# Patient Record
Sex: Female | Born: 1955 | Race: Black or African American | Hispanic: No | Marital: Single | Smoking: Current every day smoker
Health system: Southern US, Community
[De-identification: ages and names within clinical notes are randomized; demographics above are authoritative.]

## PROBLEM LIST (undated history)

## (undated) DIAGNOSIS — C801 Malignant (primary) neoplasm, unspecified: Secondary | ICD-10-CM

## (undated) DIAGNOSIS — Z923 Personal history of irradiation: Secondary | ICD-10-CM

## (undated) DIAGNOSIS — R011 Cardiac murmur, unspecified: Secondary | ICD-10-CM

## (undated) DIAGNOSIS — I10 Essential (primary) hypertension: Secondary | ICD-10-CM

## (undated) DIAGNOSIS — I509 Heart failure, unspecified: Secondary | ICD-10-CM

## (undated) HISTORY — PX: ABDOMINAL HYSTERECTOMY: SHX81

## (undated) HISTORY — PX: CARDIAC CATHETERIZATION: SHX172

---

## 2014-10-14 ENCOUNTER — Ambulatory Visit: Payer: Self-pay | Admitting: Podiatry

## 2014-10-17 ENCOUNTER — Ambulatory Visit (INDEPENDENT_AMBULATORY_CARE_PROVIDER_SITE_OTHER): Payer: No Typology Code available for payment source | Admitting: Podiatry

## 2014-10-17 ENCOUNTER — Encounter: Payer: Self-pay | Admitting: Podiatry

## 2014-10-17 VITALS — BP 177/101 | HR 79 | Ht 65.0 in | Wt 132.0 lb

## 2014-10-17 DIAGNOSIS — B079 Viral wart, unspecified: Secondary | ICD-10-CM

## 2014-10-17 DIAGNOSIS — B078 Other viral warts: Secondary | ICD-10-CM | POA: Insufficient documentation

## 2014-10-17 DIAGNOSIS — Q828 Other specified congenital malformations of skin: Secondary | ICD-10-CM | POA: Insufficient documentation

## 2014-10-17 DIAGNOSIS — M79606 Pain in leg, unspecified: Secondary | ICD-10-CM

## 2014-10-17 NOTE — Patient Instructions (Signed)
Seen for painful lesion under right and left foot. Both debrided. Return in one week after applying acid patch for a week to have it removed.

## 2014-10-17 NOTE — Progress Notes (Signed)
Subjective:  58 year old female points callused area under 5th metatarsal on left, under 5th metatarsal base right foot been hurt for 5 months. She tried everything and nothing has helped (soaking, [employer] cream, medicated pad etc.).  Objective: Painful circular porokeratotic lesion, 0.8cm under the base of 5th Metatarsal right foot and under the 4th MPJ area ball of left foot.  Neurovascular status are within normal. Mild bunion bilateral.  Assessment: R/O Verruca plantaris. Porokeratosis bilateral painful.  Plan: Reviewed findings.  Both feet debrided. May benefit from acid treatment and removal of the lesion. Patient will return after next week holiday for this procedure.

## 2014-10-31 ENCOUNTER — Ambulatory Visit: Payer: Self-pay | Admitting: Podiatry

## 2020-04-17 ENCOUNTER — Ambulatory Visit: Payer: Self-pay | Admitting: Medical

## 2020-04-17 DIAGNOSIS — Z0289 Encounter for other administrative examinations: Secondary | ICD-10-CM

## 2020-08-30 DIAGNOSIS — I509 Heart failure, unspecified: Secondary | ICD-10-CM | POA: Insufficient documentation

## 2021-03-08 DIAGNOSIS — I701 Atherosclerosis of renal artery: Secondary | ICD-10-CM | POA: Insufficient documentation

## 2021-03-08 DIAGNOSIS — I2510 Atherosclerotic heart disease of native coronary artery without angina pectoris: Secondary | ICD-10-CM | POA: Insufficient documentation

## 2021-03-08 DIAGNOSIS — Z720 Tobacco use: Secondary | ICD-10-CM | POA: Insufficient documentation

## 2021-08-04 ENCOUNTER — Other Ambulatory Visit: Payer: Self-pay | Admitting: Internal Medicine

## 2021-08-04 DIAGNOSIS — N6315 Unspecified lump in the right breast, overlapping quadrants: Secondary | ICD-10-CM

## 2021-09-08 ENCOUNTER — Other Ambulatory Visit: Payer: Self-pay

## 2021-09-08 ENCOUNTER — Other Ambulatory Visit: Payer: Self-pay | Admitting: Internal Medicine

## 2021-09-08 ENCOUNTER — Ambulatory Visit
Admission: RE | Admit: 2021-09-08 | Discharge: 2021-09-08 | Disposition: A | Discharge status: Home / Self Care | Payer: Self-pay | Source: Ambulatory Visit | Attending: Internal Medicine | Admitting: Internal Medicine

## 2021-09-08 ENCOUNTER — Ambulatory Visit
Admission: RE | Admit: 2021-09-08 | Discharge: 2021-09-08 | Disposition: A | Discharge status: Home / Self Care | Payer: Medicare Other | Source: Ambulatory Visit | Attending: Internal Medicine | Admitting: Internal Medicine

## 2021-09-08 DIAGNOSIS — N6315 Unspecified lump in the right breast, overlapping quadrants: Secondary | ICD-10-CM

## 2021-09-10 ENCOUNTER — Other Ambulatory Visit: Payer: Self-pay

## 2021-09-10 ENCOUNTER — Ambulatory Visit
Admission: RE | Admit: 2021-09-10 | Discharge: 2021-09-10 | Disposition: A | Discharge status: Home / Self Care | Payer: Medicare Other | Source: Ambulatory Visit | Attending: Internal Medicine | Admitting: Internal Medicine

## 2021-09-10 ENCOUNTER — Other Ambulatory Visit: Payer: Self-pay | Admitting: Internal Medicine

## 2021-09-10 DIAGNOSIS — N6315 Unspecified lump in the right breast, overlapping quadrants: Secondary | ICD-10-CM

## 2021-09-14 ENCOUNTER — Other Ambulatory Visit: Payer: Self-pay | Admitting: Surgery

## 2021-09-14 DIAGNOSIS — I10 Essential (primary) hypertension: Secondary | ICD-10-CM | POA: Insufficient documentation

## 2021-09-15 NOTE — Progress Notes (Addendum)
[facility] CONSULT NOTE  Patient Care Team: Pcp, No as PCP - General  CHIEF COMPLAINTS/PURPOSE OF CONSULTATION:  Newly diagnosed breast cancer  HISTORY OF PRESENTING ILLNESS:  Lynn Mcgee 65 y.o. female is here because of recent diagnosis of invasive mammary carcinoma of the right breast. She presented with right breast thickening and swelling. Diagnostic mammogram and [place] on 09/08/2021 showed highly suspicious mass involving the majority of the right breast measuring at least 11 cm, and diffuse right breast skin thickening and 3 abnormal right axillary lymph nodes likely representing malignant involvement. Biopsy on 09/10/2021 showed invasive mammary carcinoma with pleomorphic features and right axillary lymph node positive for metastatic carcinoma, ER-/PR-(<1%)/Her2+ (3+). She presents to the clinic today for initial evaluation and discussion of treatment options.  She felt that the hardening of her breast started about 6 weeks ago.  I reviewed her records extensively and collaborated the history with the patient.  SUMMARY OF ONCOLOGIC HISTORY: Oncology History  Malignant neoplasm of upper-outer quadrant of right breast in female, estrogen receptor negative ([place])  09/10/2021 Initial Diagnosis   Right breast thickening and swelling: Mammogram revealed extremely hard right breast with peau d'orange skin changes, ultrasound revealed very large irregular mass measuring 11 cm with diffuse skin thickening, 3 abnormal right axillary lymph nodes, biopsy revealed grade 3 invasive mammary cancer with pleomorphic features, lymph node positive, ER 0%, PR 0%, HER2 3+, Ki-67 30%   09/16/2021 Cancer Staging   Staging form: Breast, AJCC 8th Edition - Clinical stage from 09/16/2021: Stage IIIB (cT4d, cN1, cM0, G3, ER-, PR-, HER2+) - Signed by Nicholas Lose, MD on 09/16/2021 Stage prefix: Initial diagnosis Histologic grading system: 3 grade system     MEDICAL HISTORY:  History of  congestive heart failure  SURGICAL HISTORY: No prior breast surgeries  SOCIAL HISTORY: Denies any tobacco or alcohol or recreational drug use  FAMILY HISTORY: No family history of breast cancer    ALLERGIES:  has No Known Allergies.  MEDICATIONS:  No current outpatient medications on file.   No current facility-administered medications for this visit.    REVIEW OF SYSTEMS:   Constitutional: Denies fevers, chills or abnormal night sweats Eyes: Denies blurriness of vision, double vision or watery eyes Ears, nose, mouth, throat, and face: Denies mucositis or sore throat Respiratory: Denies cough, dyspnea or wheezes Cardiovascular: Denies palpitation, chest discomfort or lower extremity swelling Gastrointestinal:  Denies nausea, heartburn or change in bowel habits Skin: Denies abnormal skin rashes Lymphatics: Denies new lymphadenopathy or easy bruising Neurological:Denies numbness, tingling or new weaknesses Behavioral/Psych: Mood is stable, no new changes  Breast: Large mass in the right breast with lymphedema changes All other systems were reviewed with the patient and are negative.  PHYSICAL EXAMINATION: ECOG PERFORMANCE STATUS: 1 - Symptomatic but completely ambulatory  Vitals:   09/16/21 1308  BP: (!) 198/88  Pulse: 94  Resp: 18  Temp: 98.1 F (36.7 C)  SpO2: 98%   Filed Weights   09/16/21 1308  Weight: 126 lb 1.6 oz (57.2 kg)      RADIOGRAPHIC STUDIES: I have personally reviewed the radiological reports and agreed with the findings in the report.  ASSESSMENT AND PLAN:  Malignant neoplasm of upper-outer quadrant of right breast in female, estrogen receptor negative ([place]) 09/10/2021 right breast thickening and swelling: Mammogram revealed extremely hard right breast with peau d'orange skin changes, ultrasound revealed very large irregular mass measuring 11 cm with diffuse skin thickening, 3 abnormal right axillary lymph nodes, biopsy revealed grade 3  invasive  mammary cancer with pleomorphic features, lymph node positive, ER 0%, PR 0%, HER2 3+, Ki-67 30%  Inflammatory breast cancer stage IIIb  Pathology and radiology counseling: Discussed with the patient, the details of pathology including the type of breast cancer,the clinical staging, the significance of ER, PR and HER-2/neu receptors and the implications for treatment. After reviewing the pathology in detail, we proceeded to discuss the different treatment options between surgery, radiation, chemotherapy, antiestrogen therapies.  Recommendation based on multidisciplinary tumor board: 1. Neoadjuvant chemotherapy with TCHP 2. Followed by breast conserving surgery with sentinel lymph node study vs targeted axillary dissection 3. Followed by adjuvant radiation therapy  Chemotherapy Counseling: I discussed the risks and benefits of chemotherapy including the risks of nausea/ vomiting, risk of infection from low WBC count, fatigue due to chemo or anemia, bruising or bleeding due to low platelets, mouth sores, loss/ change in taste and decreased appetite. Liver and kidney function will be monitored through out chemotherapy as abnormalities in liver and kidney function may be a side effect of treatment.  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  Plan: 1. Port placement  2. Echocardiogram 3. Chemotherapy class 4. Breast MRI 5. CT chest abdomen pelvis and bone scan for staging     Return to clinic in 1 week to start chemotherapy.   All questions were answered. The patient knows to call the clinic with any problems, questions or concerns.   Rulon Eisenmenger, MD, MPH 09/16/2021    I, Thana Ates, am acting as scribe for Nicholas Lose, MD.  I have reviewed the above documentation for accuracy and completeness, and I agree with the above.

## 2021-09-16 ENCOUNTER — Other Ambulatory Visit: Payer: Self-pay

## 2021-09-16 ENCOUNTER — Inpatient Hospital Stay: Payer: Medicare Other | Attending: Hematology and Oncology | Admitting: Hematology and Oncology

## 2021-09-16 ENCOUNTER — Other Ambulatory Visit: Payer: Self-pay | Admitting: *Deleted

## 2021-09-16 ENCOUNTER — Encounter: Payer: Self-pay | Admitting: *Deleted

## 2021-09-16 ENCOUNTER — Encounter: Payer: Self-pay | Admitting: General Practice

## 2021-09-16 ENCOUNTER — Other Ambulatory Visit: Payer: Self-pay | Admitting: Radiology

## 2021-09-16 ENCOUNTER — Telehealth: Payer: Self-pay | Admitting: *Deleted

## 2021-09-16 VITALS — BP 198/88 | HR 94 | Temp 98.1°F | Resp 18 | Wt 126.1 lb

## 2021-09-16 DIAGNOSIS — Z171 Estrogen receptor negative status [ER-]: Secondary | ICD-10-CM | POA: Diagnosis not present

## 2021-09-16 DIAGNOSIS — Z5111 Encounter for antineoplastic chemotherapy: Secondary | ICD-10-CM | POA: Diagnosis not present

## 2021-09-16 DIAGNOSIS — Z5189 Encounter for other specified aftercare: Secondary | ICD-10-CM | POA: Diagnosis not present

## 2021-09-16 DIAGNOSIS — C50411 Malignant neoplasm of upper-outer quadrant of right female breast: Secondary | ICD-10-CM

## 2021-09-16 MED ORDER — PROCHLORPERAZINE MALEATE 10 MG PO TABS: 10.0000 mg | ORAL_TABLET | Freq: Four times a day (QID) | ORAL | 1 refills | Status: DC | PRN

## 2021-09-16 MED ORDER — DEXAMETHASONE 4 MG PO TABS: 4.0000 mg | ORAL_TABLET | Freq: Every day | ORAL | 0 refills | Status: DC

## 2021-09-16 MED ORDER — LIDOCAINE-PRILOCAINE 2.5-2.5 % EX CREA: TOPICAL_CREAM | CUTANEOUS | 3 refills | Status: DC

## 2021-09-16 MED ORDER — ONDANSETRON HCL 8 MG PO TABS: 8.0000 mg | ORAL_TABLET | Freq: Two times a day (BID) | ORAL | 1 refills | Status: DC | PRN

## 2021-09-16 MED ORDER — LORAZEPAM 0.5 MG PO TABS: 0.5000 mg | ORAL_TABLET | Freq: Every evening | ORAL | 0 refills | Status: DC | PRN

## 2021-09-16 NOTE — Progress Notes (Signed)
START ON PATHWAY REGIMEN - Breast     Cycle 1: A cycle is 21 days:     Pertuzumab      Trastuzumab-xxxx      Docetaxel      Carboplatin    Cycles 2 through 6: A cycle is every 21 days:     Pertuzumab      Trastuzumab-xxxx      Docetaxel      Carboplatin   **Always confirm dose/schedule in your pharmacy ordering system**  Patient Characteristics: Preoperative or Nonsurgical Candidate (Clinical Staging), Neoadjuvant Therapy followed by Surgery, Invasive Disease, Chemotherapy, HER2 Positive, ER Negative/Unknown Therapeutic Status: Preoperative or Nonsurgical Candidate (Clinical Staging) AJCC M Category: cM0 AJCC Grade: G3 Breast Surgical Plan: Neoadjuvant Therapy followed by Surgery ER Status: Negative (-) AJCC 8 Stage Grouping: IIIB HER2 Status: Positive (+) AJCC T Category: cT4b AJCC N Category: cN1 PR Status: Negative (-) Intent of Therapy: Curative Intent, Discussed with Patient

## 2021-09-16 NOTE — Assessment & Plan Note (Signed)
09/10/2021 right breast thickening and swelling: Mammogram revealed extremely hard right breast with peau d'orange skin changes, ultrasound revealed very large irregular mass measuring 11 cm with diffuse skin thickening, 3 abnormal right axillary lymph nodes, biopsy revealed grade 3 invasive mammary cancer with pleomorphic features, lymph node positive, ER 0%, PR 0%, HER2 3+, Ki-67 30%  Inflammatory breast cancer stage IIIb  Pathology and radiology counseling: Discussed with the patient, the details of pathology including the type of breast cancer,the clinical staging, the significance of ER, PR and HER-2/neu receptors and the implications for treatment. After reviewing the pathology in detail, we proceeded to discuss the different treatment options between surgery, radiation, chemotherapy, antiestrogen therapies.  Recommendation based on multidisciplinary tumor board: 1. Neoadjuvant chemotherapy with Adriamycin and Cytoxan dose dense 4 followed by Taxol weekly 12 with carboplatin every 3 weeks x4 2. Followed by breast conserving surgery with sentinel lymph node study vs targeted axillary dissection 3. Followed by adjuvant radiation therapy  Chemotherapy Counseling: I discussed the risks and benefits of chemotherapy including the risks of nausea/ vomiting, risk of infection from low WBC count, fatigue due to chemo or anemia, bruising or bleeding due to low platelets, mouth sores, loss/ change in taste and decreased appetite. Liver and kidney function will be monitored through out chemotherapy as abnormalities in liver and kidney function may be a side effect of treatment.  Peripheral neuropathy due to Taxol and cardiac dysfunction due to Adriamycin was discussed in detail. Risk of permanent bone marrow dysfunction due to chemo were also discussed.  Plan: 1. Port placement  2. Echocardiogram 3. Chemotherapy class 4. Breast MRI 5. CT chest abdomen pelvis and bone scan for staging Genetic  counseling will also be arranged  URCC 16070: Treatment of refractory nausea.  After first cycle of chemo if patient experience chemo induced nausea and vomiting the randomized from cycle 2 to Aloxi plus Dex plus olanzapine or placebo plus Compazine or placebo plus placebo prior to chemo and take home medications for day 2 today for of Dex plus olanzapine or placebo and Compazine or placebo every 8 hours.  If patient does not have nausea after cycle 1, then the trial is complete.  Return to clinic in 1 week to start chemotherapy.

## 2021-09-16 NOTE — Progress Notes (Signed)
[facility] CSW Progress Notes  Met w patient, husband and daughter (here from [place]) in exam room at request of RN.  Patient wants information on community resources for financial assistance.  Briefly discussed options for Medicare, advised them to contact [employer] for [employer] - she may be able to obtain better coverage for her medical needs during open enrollment period.  [place] will contact her re Advertising account executive after she has a treatment plan in place.  Briefly described [facility] in [place] and [place] - she is somewhat overwhelmed with information at this time and will contact CSW when she is ready to hear more.  She is currently working and is concerned about loss of income and increase in expenses as result of this diagnosis.  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  Encouraged daughter to reach out to CSW as needed if mother is agreeable.  Will mail application copies to home address. Provided information on Support Center during visit.   Edwyna Shell, LCSW Clinical Social Worker Phone:  [phone]

## 2021-09-16 NOTE — Telephone Encounter (Signed)
Called and spoke with patient to follow up from new patient appointment and assess navigation needs. Spoke with patient's daughter [name] as well.  Informed them that I was able to get her port placed by IR for 10/21. Patient is to arrive at 10am, npo after MN and needs a driver. Patient and daughter verbalized understanding. Informed them that I am working on everything else and will let them know. Contact information given.

## 2021-09-17 ENCOUNTER — Other Ambulatory Visit: Payer: Self-pay

## 2021-09-17 ENCOUNTER — Telehealth: Payer: Self-pay | Admitting: Hematology and Oncology

## 2021-09-17 ENCOUNTER — Ambulatory Visit (HOSPITAL_COMMUNITY)
Admission: RE | Admit: 2021-09-17 | Discharge: 2021-09-17 | Disposition: A | Discharge status: Home / Self Care | Payer: 59 | Source: Ambulatory Visit | Attending: Hematology and Oncology | Admitting: Hematology and Oncology

## 2021-09-17 ENCOUNTER — Other Ambulatory Visit: Payer: Self-pay | Admitting: Hematology and Oncology

## 2021-09-17 DIAGNOSIS — C50411 Malignant neoplasm of upper-outer quadrant of right female breast: Secondary | ICD-10-CM

## 2021-09-17 DIAGNOSIS — Z171 Estrogen receptor negative status [ER-]: Secondary | ICD-10-CM | POA: Insufficient documentation

## 2021-09-17 DIAGNOSIS — F17210 Nicotine dependence, cigarettes, uncomplicated: Secondary | ICD-10-CM | POA: Insufficient documentation

## 2021-09-17 HISTORY — PX: IR IMAGING GUIDED PORT INSERTION: IMG5740

## 2021-09-17 MED ORDER — SODIUM CHLORIDE 0.9 % IV SOLN: INTRAVENOUS | Status: DC

## 2021-09-17 MED ORDER — MIDAZOLAM HCL 2 MG/2ML IJ SOLN
INTRAMUSCULAR | Status: AC
  Filled 2021-09-17: qty 2

## 2021-09-17 MED ORDER — FENTANYL CITRATE (PF) 100 MCG/2ML IJ SOLN
INTRAMUSCULAR | Status: AC | PRN
  Administered 2021-09-17: 25 ug via INTRAVENOUS
  Administered 2021-09-17: 50 ug via INTRAVENOUS

## 2021-09-17 MED ORDER — FENTANYL CITRATE (PF) 100 MCG/2ML IJ SOLN
INTRAMUSCULAR | Status: AC
  Filled 2021-09-17: qty 2

## 2021-09-17 MED ORDER — HEPARIN SOD (PORK) LOCK FLUSH 100 UNIT/ML IV SOLN
INTRAVENOUS | Status: AC
  Filled 2021-09-17: qty 5

## 2021-09-17 MED ORDER — LIDOCAINE-EPINEPHRINE 1 %-1:100000 IJ SOLN
INTRAMUSCULAR | Status: AC
  Filled 2021-09-17: qty 1

## 2021-09-17 MED ORDER — SODIUM CHLORIDE 0.9 % IV SOLN
INTRAVENOUS | Status: AC | PRN
  Administered 2021-09-17: 10 mL/h via INTRAVENOUS

## 2021-09-17 MED ORDER — MIDAZOLAM HCL 2 MG/2ML IJ SOLN
INTRAMUSCULAR | Status: AC | PRN
  Administered 2021-09-17: 1 mg via INTRAVENOUS
  Administered 2021-09-17: .5 mg via INTRAVENOUS

## 2021-09-17 MED ORDER — LIDOCAINE HCL (PF) 1 % IJ SOLN
INTRAMUSCULAR | Status: AC | PRN
  Administered 2021-09-17: 10 mL via SUBCUTANEOUS

## 2021-09-17 NOTE — H&P (Signed)
Chief Complaint: Chemotherapy access. Request is for portacath placement. Left sided.   Referring Physician(s): NOBSJG,GEZMO  Supervising Physician: Ruthann Cancer  Patient Status: [facility] - Out-pt  History of Present Illness: Lynn Mcgee is a 65 y.o. female 65 y.o. female outpatient. 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  Plan is for neoadjuvant chemotherapy followed by breast surgery and lymph node dissection and then radiaton. but Patient presents for portacath placement for chemotherapy access. Left sided.  Currently without any significant complaints. Patient alert and laying in bed, calm and comfortable. Denies any fevers, headache, chest pain, SOB, cough, abdominal pain, nausea, vomiting or bleeding. Return precautions, treatment recommendations and follow-up discussed with the patient who is agreeable with the plan.    No past medical history on file.  No past surgical history on file.  Allergies: Patient has no known allergies.  Medications: Prior to Admission medications   Medication Sig Start Date End Date Taking? Authorizing Provider  dexamethasone (DECADRON) 4 MG tablet Take 1 tablet (4 mg total) by mouth daily. Take 1 tab day before chemo and 1 tab day after chemo with food 09/16/21   Nicholas Lose, MD  lidocaine-prilocaine (EMLA) cream Apply to affected area once 09/16/21   Nicholas Lose, MD  LORazepam (ATIVAN) 0.5 MG tablet Take 1 tablet (0.5 mg total) by mouth at bedtime as needed for sleep. 09/16/21   Nicholas Lose, MD  ondansetron (ZOFRAN) 8 MG tablet Take 1 tablet (8 mg total) by mouth 2 (two) times daily as needed (Nausea or vomiting). Start on the third day after chemotherapy. 09/16/21   Nicholas Lose, MD  prochlorperazine (COMPAZINE) 10 MG tablet Take 1 tablet (10 mg total) by mouth every 6 (six) hours as needed (Nausea or vomiting). 09/16/21   Nicholas Lose, MD     No  family history on file.  Social History   Socioeconomic History   Marital status: Single    Spouse name: Not on file   Number of children: Not on file   Years of education: Not on file   Highest education level: Not on file  Occupational History   Not on file  Tobacco Use   Smoking status: Every Day    Types: Cigarettes   Smokeless tobacco: Never  Substance and Sexual Activity   Alcohol use: Not on file   Drug use: Not on file   Sexual activity: Not on file  Other Topics Concern   Not on file  Social History Narrative   Not on file   Social Determinants of Health   Financial Resource Strain: Not on file  Food Insecurity: Not on file  Transportation Needs: Not on file  Physical Activity: Not on file  Stress: Not on file  Social Connections: Not on file     Review of Systems: A 12 point ROS discussed and pertinent positives are indicated in the HPI above.  All other systems are negative.  Review of Systems  Constitutional:  Negative for fatigue and fever.  HENT:  Negative for congestion.   Respiratory:  Negative for cough and shortness of breath.   Gastrointestinal:  Negative for abdominal pain, diarrhea, nausea and vomiting.   Vital Signs: BP (!) 164/87   Pulse 86   Temp 97.9 F (36.6 C) (Oral)   Resp 15   Ht 5' 5"  (1.651 m)   Wt 127 lb (57.6 kg)   LMP  (LMP Unknown)  SpO2 100%   BMI 21.13 kg/m   Physical Exam Vitals and nursing note reviewed.  Constitutional:      Appearance: She is well-developed.  HENT:     Head: Normocephalic and atraumatic.  Eyes:     Conjunctiva/sclera: Conjunctivae normal.  Cardiovascular:     Rate and Rhythm: Normal rate and regular rhythm.     Heart sounds: Normal heart sounds.  Pulmonary:     Effort: Pulmonary effort is normal.     Breath sounds: Normal breath sounds.  Musculoskeletal:        General: Normal range of motion.     Cervical back: Normal range of motion.  Skin:    General: Skin is warm.  Neurological:      Mental Status: She is alert and oriented to person, place, and time.    Imaging: US BREAST LTD UNI RIGHT INC AXILLA  Addendum Date: 09/09/2021   ADDENDUM REPORT: 09/09/2021 09:48 ADDENDUM: Comparison should read NONE Electronically Signed   By: Margarette Canada M.D.   On: 09/09/2021 09:48   Result Date: 09/09/2021 CLINICAL DATA:  65 year old female with RIGHT breast thickening and swelling. Also for bilateral mammogram. EXAM: DIGITAL DIAGNOSTIC BILATERAL MAMMOGRAM WITH TOMOSYNTHESIS AND CAD; ULTRASOUND RIGHT BREAST LIMITED TECHNIQUE: Bilateral digital diagnostic mammography and breast tomosynthesis was performed. The images were evaluated with computer-aided detection.; Targeted ultrasound examination of the right breast was performed COMPARISON:  Previous exam(s). ACR Breast Density Category c: The breast tissue is heterogeneously dense, which may obscure small masses. FINDINGS: 2D/3D full field views of both breasts demonstrate a large ill-defined mass encompassing the majority of the RIGHT breast. Diffuse RIGHT breast skin thickening is also noted. No suspicious LEFT breast abnormalities are noted. On physical exam, the majority of the RIGHT breast is extremely hard. Peau d'orange RIGHT breast skin changes are noted. Targeted ultrasound is performed, showing a very large irregular mixed echogenicity but primarily hypoechoic mass throughout most of the central, UPPER and OUTER RIGHT breast with greatest diameter measuring at least 11 cm. Diffuse RIGHT breast skin thickening is present. Three abnormal RIGHT axillary lymph nodes with cortical thickening are noted. IMPRESSION: 1. Highly suspicious mass involving the majority of the RIGHT breast measuring at least 11 cm. Diffuse RIGHT breast skin thickening and 3 abnormal RIGHT axillary lymph nodes likely representing malignant involvement. Tissue sampling of the RIGHT breast mass and 1 of the abnormal RIGHT axillary lymph nodes is recommended. 2. No  mammographic evidence of LEFT breast malignancy. RECOMMENDATION: Ultrasound-guided RIGHT breast biopsy and RIGHT axillary lymph node biopsy. I have discussed the findings and recommendations with the patient. If applicable, a reminder letter will be sent to the patient regarding the next appointment. BI-RADS CATEGORY  5: Highly suggestive of malignancy. Electronically Signed: By: Margarette Canada M.D. On: 09/08/2021 14:40  MM DIAG BREAST TOMO BILATERAL  Addendum Date: 09/09/2021   ADDENDUM REPORT: 09/09/2021 09:48 ADDENDUM: Comparison should read NONE Electronically Signed   By: Margarette Canada M.D.   On: 09/09/2021 09:48   Result Date: 09/09/2021 CLINICAL DATA:  65 year old female with RIGHT breast thickening and swelling. Also for bilateral mammogram. EXAM: DIGITAL DIAGNOSTIC BILATERAL MAMMOGRAM WITH TOMOSYNTHESIS AND CAD; ULTRASOUND RIGHT BREAST LIMITED TECHNIQUE: Bilateral digital diagnostic mammography and breast tomosynthesis was performed. The images were evaluated with computer-aided detection.; Targeted ultrasound examination of the right breast was performed COMPARISON:  Previous exam(s). ACR Breast Density Category c: The breast tissue is heterogeneously dense, which may obscure small masses. FINDINGS: 2D/3D full field views of  both breasts demonstrate a large ill-defined mass encompassing the majority of the RIGHT breast. Diffuse RIGHT breast skin thickening is also noted. No suspicious LEFT breast abnormalities are noted. On physical exam, the majority of the RIGHT breast is extremely hard. Peau d'orange RIGHT breast skin changes are noted. Targeted ultrasound is performed, showing a very large irregular mixed echogenicity but primarily hypoechoic mass throughout most of the central, UPPER and OUTER RIGHT breast with greatest diameter measuring at least 11 cm. Diffuse RIGHT breast skin thickening is present. Three abnormal RIGHT axillary lymph nodes with cortical thickening are noted. IMPRESSION: 1.  Highly suspicious mass involving the majority of the RIGHT breast measuring at least 11 cm. Diffuse RIGHT breast skin thickening and 3 abnormal RIGHT axillary lymph nodes likely representing malignant involvement. Tissue sampling of the RIGHT breast mass and 1 of the abnormal RIGHT axillary lymph nodes is recommended. 2. No mammographic evidence of LEFT breast malignancy. RECOMMENDATION: Ultrasound-guided RIGHT breast biopsy and RIGHT axillary lymph node biopsy. I have discussed the findings and recommendations with the patient. If applicable, a reminder letter will be sent to the patient regarding the next appointment. BI-RADS CATEGORY  5: Highly suggestive of malignancy. Electronically Signed: By: Margarette Canada M.D. On: 09/08/2021 14:40   [place] AXILLARY NODE CORE BIOPSY RIGHT  Result Date: 09/10/2021 CLINICAL DATA:  Patient with suspicious right breast mass and thickened right axillary lymph nodes. EXAM: ULTRASOUND GUIDED RIGHT BREAST CORE NEEDLE BIOPSY COMPARISON:  Previous exam(s). PROCEDURE: I met with the patient and we discussed the procedure of ultrasound-guided biopsy, including benefits and alternatives. We discussed the high likelihood of a successful procedure. We discussed the risks of the procedure, including infection, bleeding, tissue injury, clip migration, and inadequate sampling. Informed written consent was given. The usual time-out protocol was performed immediately prior to the procedure. Site 1: Right breast mass 12 o'clock position Lesion quadrant: Upper outer quadrant Using sterile technique and 1% Lidocaine as local anesthetic, under direct ultrasound visualization, a 14 gauge spring-loaded device was used to perform biopsy of right breast mass using a lateral approach. At the conclusion of the procedure ribbon shaped tissue marker clip was deployed into the biopsy cavity. Follow up 2 view mammogram was performed and dictated separately. Site 2: Right axillary lymph node Lesion quadrant:  Upper outer quadrant Using sterile technique and 1% Lidocaine as local anesthetic, under direct ultrasound visualization, a 14 gauge spring-loaded device was used to perform biopsy of right axillary lymph node using a lateral approach. At the conclusion of the procedure tri bell tissue marker clip was deployed into the biopsy cavity. Follow up 2 view mammogram was performed and dictated separately. IMPRESSION: Ultrasound guided biopsy of right breast mass and right axillary lymph node. No apparent complications. Electronically Signed   By: Lovey Newcomer M.D.   On: 09/10/2021 09:17  MM CLIP PLACEMENT RIGHT  Result Date: 09/10/2021 CLINICAL DATA:  Patient status post ultrasound-guided biopsy right breast mass and right axillary lymph node. EXAM: 3D DIAGNOSTIC RIGHT MAMMOGRAM POST ULTRASOUND BIOPSY COMPARISON:  Previous exam(s). FINDINGS: 3D Mammographic images were obtained following ultrasound guided biopsy of right breast mass and right axillary lymph node. Site 1: Right breast mass 12 o'clock position: Ribbon shaped clip: In appropriate position. Site 2: Right axillary lymph node: Marker clip not included on current exam. IMPRESSION: Appropriate positioning of the ribbon shaped marking clip in the right breast mass. The tri bell clip is not able to be visualized within the node. Final Assessment: Post Procedure Mammograms  for Marker Placement Electronically Signed   By: Lovey Newcomer M.D.   On: 09/10/2021 09:19  [place] RT BREAST BX W LOC DEV 1ST LESION IMG BX SPEC US GUIDE  Result Date: 09/10/2021 CLINICAL DATA:  Patient with suspicious right breast mass and thickened right axillary lymph nodes. EXAM: ULTRASOUND GUIDED RIGHT BREAST CORE NEEDLE BIOPSY COMPARISON:  Previous exam(s). PROCEDURE: I met with the patient and we discussed the procedure of ultrasound-guided biopsy, including benefits and alternatives. We discussed the high likelihood of a successful procedure. We discussed the risks of the procedure,  including infection, bleeding, tissue injury, clip migration, and inadequate sampling. Informed written consent was given. The usual time-out protocol was performed immediately prior to the procedure. Site 1: Right breast mass 12 o'clock position Lesion quadrant: Upper outer quadrant Using sterile technique and 1% Lidocaine as local anesthetic, under direct ultrasound visualization, a 14 gauge spring-loaded device was used to perform biopsy of right breast mass using a lateral approach. At the conclusion of the procedure ribbon shaped tissue marker clip was deployed into the biopsy cavity. Follow up 2 view mammogram was performed and dictated separately. Site 2: Right axillary lymph node Lesion quadrant: Upper outer quadrant Using sterile technique and 1% Lidocaine as local anesthetic, under direct ultrasound visualization, a 14 gauge spring-loaded device was used to perform biopsy of right axillary lymph node using a lateral approach. At the conclusion of the procedure tri bell tissue marker clip was deployed into the biopsy cavity. Follow up 2 view mammogram was performed and dictated separately. IMPRESSION: Ultrasound guided biopsy of right breast mass and right axillary lymph node. No apparent complications. Electronically Signed   By: Lovey Newcomer M.D.   On: 09/10/2021 09:17   Labs:  CBC: No results for input(s): WBC, HGB, HCT, PLT in the last 8760 hours.  COAGS: No results for input(s): INR, APTT in the last 8760 hours.  BMP: No results for input(s): NA, K, CL, CO2, GLUCOSE, BUN, CALCIUM, CREATININE, GFRNONAA, GFRAA in the last 8760 hours.  Invalid input(s): CMP  LIVER FUNCTION TESTS: No results for input(s): BILITOT, AST, ALT, ALKPHOS, PROT, ALBUMIN in the last 8760 hours.  TUMOR MARKERS: No results for input(s): AFPTM, CEA, CA199, CHROMGRNA in the last 8760 hours.  Assessment and Plan:  65 y.o. female outpatient. Per note from oncology, history of CHF and newly diagnosed right upper  outer breast cancer with mets to the right axillary.  Plan is for neoadjuvant chemotherapy followed by breast surgery and lymph node dissection and then radiaton. Patient presents for portacath placement for chemotherapy access. Left sided.   No recent labs. All medications are within acceptable parameters. NKDA. Patient has been NPO since midnight.  Risks and benefits of image guided port-a-catheter placement was discussed with the patient including, but not limited to bleeding, infection, pneumothorax, or fibrin sheath development and need for additional procedures.  All of the patient's questions were answered, patient is agreeable to proceed. Consent signed and in chart.   Thank you for this interesting consult.  I greatly enjoyed meeting Taelyr Jantz and look forward to participating in their care.  A copy of this report was sent to the requesting provider on this date.  Electronically Signed: Jacqualine Mau, NP 09/17/2021, 10:04 AM   I spent a total of  30 Minutes   in face to face in clinical consultation, greater than 50% of which was counseling/coordinating care for portacath placement

## 2021-09-17 NOTE — Telephone Encounter (Signed)
Spoke to patients daughter [name] to give updated appointment days and times, sent [name] a calendar with the appointments and locations as well as prep for the CT scan, advised her to reach out to myself or her navigators if she had an additional questions or concerns.

## 2021-09-17 NOTE — Progress Notes (Signed)
Pharmacist Chemotherapy Monitoring - Initial Assessment    Anticipated start date: 09/24/21   The following has been reviewed per standard work regarding the patient's treatment regimen: The patient's diagnosis, treatment plan and drug doses, and organ/hematologic function Lab orders and baseline tests specific to treatment regimen  The treatment plan start date, drug sequencing, and pre-medications Prior authorization status  Patient's documented medication list, including drug-drug interaction screen and prescriptions for anti-emetics and supportive care specific to the treatment regimen The drug concentrations, fluid compatibility, administration routes, and timing of the medications to be used The patient's access for treatment and lifetime cumulative dose history, if applicable  The patient's medication allergies and previous infusion related reactions, if applicable   Changes made to treatment plan:  N/A  Follow up needed:  Pending authorization for treatment    Larene Beach, [place], 09/17/2021  12:48 PM

## 2021-09-17 NOTE — Progress Notes (Signed)
The following biosimilar Kanjinti (trastuzumab-anns) has been selected for use in this patient per insurance.  Klara Stjames, PharmD 

## 2021-09-17 NOTE — Procedures (Signed)
Interventional Radiology Procedure Note  Procedure: Single Lumen Power Port Placement    Access:  Left internal jugular vein  Findings: Catheter tip positioned at cavoatrial junction. Port is ready for immediate use.   Complications: None  EBL: < 10 mL  Recommendations:  - Ok to shower in 24 hours - Do not submerge for 7 days - Routine line care    Joh Rao, MD   

## 2021-09-20 ENCOUNTER — Encounter: Payer: Self-pay | Admitting: *Deleted

## 2021-09-20 ENCOUNTER — Telehealth: Payer: Self-pay | Admitting: *Deleted

## 2021-09-20 NOTE — Telephone Encounter (Signed)
Spoke with patient's daughter [name] regarding patient's MRI appt that has not been scheduled. She states she was going to call and she will do so right away.  Informed her that her mom's chemo may have to be rescheduled due to insurance approval.  She states she will call insurance company as well to see if there is anything she can do to expedite. I will keep her updated.

## 2021-09-21 ENCOUNTER — Ambulatory Visit
Admission: RE | Admit: 2021-09-21 | Discharge: 2021-09-21 | Disposition: A | Discharge status: Home / Self Care | Payer: Medicare Other | Source: Ambulatory Visit | Attending: Hematology and Oncology | Admitting: Hematology and Oncology

## 2021-09-21 ENCOUNTER — Other Ambulatory Visit: Payer: Self-pay

## 2021-09-21 ENCOUNTER — Telehealth: Payer: Self-pay | Admitting: Emergency Medicine

## 2021-09-21 DIAGNOSIS — C50411 Malignant neoplasm of upper-outer quadrant of right female breast: Secondary | ICD-10-CM

## 2021-09-21 MED ORDER — GADOBUTROL 1 MMOL/ML IV SOLN
6.0000 mL | Freq: Once | INTRAVENOUS | Status: AC | PRN
  Administered 2021-09-21: 6 mL via INTRAVENOUS

## 2021-09-21 NOTE — Telephone Encounter (Signed)
[id number] - TREATMENT OF REFRACTORY NAUSEA  Attempted to contact pt as a potential study candidate for [place].  No answer, unable to leave VM as box was full.  Will attempt to contact tomorrow.  Wells Guiles 'Learta CoddingNeysa Bonito, RN, BSN Clinical Research Nurse I 09/21/21 3:52 PM

## 2021-09-22 ENCOUNTER — Telehealth: Payer: Self-pay | Admitting: Emergency Medicine

## 2021-09-22 ENCOUNTER — Ambulatory Visit (HOSPITAL_COMMUNITY)
Admission: RE | Admit: 2021-09-22 | Discharge: 2021-09-22 | Disposition: A | Discharge status: Home / Self Care | Payer: Medicare Other | Source: Ambulatory Visit | Attending: Hematology and Oncology | Admitting: Hematology and Oncology

## 2021-09-22 ENCOUNTER — Encounter (HOSPITAL_COMMUNITY): Payer: 59

## 2021-09-22 DIAGNOSIS — I2510 Atherosclerotic heart disease of native coronary artery without angina pectoris: Secondary | ICD-10-CM | POA: Diagnosis not present

## 2021-09-22 DIAGNOSIS — Z0189 Encounter for other specified special examinations: Secondary | ICD-10-CM | POA: Diagnosis not present

## 2021-09-22 DIAGNOSIS — I700 Atherosclerosis of aorta: Secondary | ICD-10-CM | POA: Diagnosis not present

## 2021-09-22 DIAGNOSIS — I77819 Aortic ectasia, unspecified site: Secondary | ICD-10-CM | POA: Diagnosis not present

## 2021-09-22 DIAGNOSIS — I351 Nonrheumatic aortic (valve) insufficiency: Secondary | ICD-10-CM | POA: Diagnosis not present

## 2021-09-22 DIAGNOSIS — J439 Emphysema, unspecified: Secondary | ICD-10-CM | POA: Insufficient documentation

## 2021-09-22 DIAGNOSIS — R590 Localized enlarged lymph nodes: Secondary | ICD-10-CM | POA: Insufficient documentation

## 2021-09-22 DIAGNOSIS — R234 Changes in skin texture: Secondary | ICD-10-CM | POA: Diagnosis not present

## 2021-09-22 DIAGNOSIS — I517 Cardiomegaly: Secondary | ICD-10-CM | POA: Diagnosis not present

## 2021-09-22 DIAGNOSIS — C50411 Malignant neoplasm of upper-outer quadrant of right female breast: Secondary | ICD-10-CM | POA: Diagnosis present

## 2021-09-22 DIAGNOSIS — Z171 Estrogen receptor negative status [ER-]: Secondary | ICD-10-CM | POA: Insufficient documentation

## 2021-09-22 LAB — ECHOCARDIOGRAM COMPLETE
AR max vel: 1.56 cm2
AV Peak grad: 11.9 mmHg
Ao pk vel: 1.73 m/s
Area-P 1/2: 2.84 cm2
Calc EF: 42.4 %
P 1/2 time: 621 msec
S' Lateral: 3.8 cm
Single Plane A2C EF: 46.9 %
Single Plane A4C EF: 42.5 %

## 2021-09-22 NOTE — Progress Notes (Signed)
Patient Care Team: Pcp, No as PCP - General Nicholas Lose, MD as Consulting Physician (Hematology and Oncology) Rockwell Germany, RN as Oncology Nurse Navigator Mauro Kaufmann, RN as Oncology Nurse Navigator  DIAGNOSIS:    ICD-10-CM   1. Malignant neoplasm of upper-outer quadrant of right breast in female, estrogen receptor negative ([place])  C50.411    Z17.1       SUMMARY OF ONCOLOGIC HISTORY: Oncology History  Malignant neoplasm of upper-outer quadrant of right breast in female, estrogen receptor negative ([place])  09/10/2021 Initial Diagnosis   Right breast thickening and swelling: Mammogram revealed extremely hard right breast with peau d'orange skin changes, ultrasound revealed very large irregular mass measuring 11 cm with diffuse skin thickening, 3 abnormal right axillary lymph nodes, biopsy revealed grade 3 invasive mammary cancer with pleomorphic features, lymph node positive, ER 0%, PR 0%, HER2 3+, Ki-67 30%   09/16/2021 Cancer Staging   Staging form: Breast, AJCC 8th Edition - Clinical stage from 09/16/2021: Stage IIIB (cT4d, cN1, cM0, G3, ER-, PR-, HER2+) - Signed by Nicholas Lose, MD on 09/16/2021 Stage prefix: Initial diagnosis Histologic grading system: 3 grade system    09/24/2021 -  Chemotherapy   Patient is on Treatment Plan : BREAST  Docetaxel + Carboplatin + Trastuzumab + Pertuzumab  (TCHP) q21d        CHIEF COMPLIANT: Follow-up of right breast cancer  INTERVAL HISTORY: Lynn Mcgee is a 65 y.o. with above-mentioned history of right breast cancer. MRI Breast on 09/22/2021 showed diffuse mass and non-mass enhancement throughout the right breast, involving all 4 quadrants, extending from anterior to posterior depth, overall measuring at least 10 cm craniocaudal dimension and 8 cm transverse dimension, and at least 5 enlarged/morphologically abnormal level 1 and level 2 lymph nodes in the right axilla. She presents to the clinic today for follow-up.   ALLERGIES:   has No Known Allergies.  MEDICATIONS:  Current Outpatient Medications  Medication Sig Dispense Refill   dexamethasone (DECADRON) 4 MG tablet Take 1 tablet (4 mg total) by mouth daily. Take 1 tab day before chemo and 1 tab day after chemo with food 12 tablet 0   lidocaine-prilocaine (EMLA) cream Apply to affected area once 30 g 3   LORazepam (ATIVAN) 0.5 MG tablet Take 1 tablet (0.5 mg total) by mouth at bedtime as needed for sleep. 30 tablet 0   ondansetron (ZOFRAN) 8 MG tablet Take 1 tablet (8 mg total) by mouth 2 (two) times daily as needed (Nausea or vomiting). Start on the third day after chemotherapy. 30 tablet 1   prochlorperazine (COMPAZINE) 10 MG tablet Take 1 tablet (10 mg total) by mouth every 6 (six) hours as needed (Nausea or vomiting). 30 tablet 1   No current facility-administered medications for this visit.    PHYSICAL EXAMINATION: ECOG PERFORMANCE STATUS: 1 - Symptomatic but completely ambulatory  Vitals:   09/23/21 0942  BP: (!) 187/78  Pulse: 65  Resp: 18  Temp: (!) 97.3 F (36.3 C)  SpO2: 98%   Filed Weights   09/23/21 0942  Weight: 126 lb 4.8 oz (57.3 kg)     LABORATORY DATA:  I have reviewed the data as listed CMP Latest Ref Rng & Units 09/23/2021  Creatinine 0.44 - 1.00 mg/dL 0.60    No results found for: WBC, HGB, HCT, MCV, PLT, NEUTROABS  ASSESSMENT & PLAN:  Malignant neoplasm of upper-outer quadrant of right breast in female, estrogen receptor negative ([place]) 09/10/2021 right breast thickening and swelling: Mammogram  revealed extremely hard right breast with peau d'orange skin changes, ultrasound revealed very large irregular mass measuring 11 cm with diffuse skin thickening, 3 abnormal right axillary lymph nodes, biopsy revealed grade 3 invasive mammary cancer with pleomorphic features, lymph node positive, ER 0%, PR 0%, HER2 3+, Ki-67 30% Stage IIIb  Treatment plan: 1. Neoadjuvant chemotherapy with TCHP 2. Followed by breast conserving surgery  with sentinel lymph node study vs targeted axillary dissection 3. 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  We will proceed with the treatment because of benefits of anti-HER2 therapy or tremendous)  Labs reviewed, chemo education completed, chemo consent obtained, antiemetics were reviewed. Return to clinic in 1 week for toxicity evaluation    No orders of the defined types were placed in this encounter.  The patient has a good understanding of the overall plan. she agrees with it. she will call with any problems that may develop before the next visit here.  Total time spent: 30 mins including face to face time and time spent for planning, charting and coordination of care  Rulon Eisenmenger, MD, MPH 09/23/2021  I, Thana Ates, am acting as scribe for Dr. Nicholas Lose.  I have reviewed the above documentation for accuracy and completeness, and I agree with the above.

## 2021-09-22 NOTE — Telephone Encounter (Signed)
[id number] - TREATMENT OF REFRACTORY NAUSEA  Attempted to contact pt regarding study, no answer.  VM full, unable to leave message.  Wells Guiles 'Learta CoddingNeysa Bonito, RN, BSN Clinical Research Nurse I 09/22/21 10:31 AM

## 2021-09-23 ENCOUNTER — Other Ambulatory Visit: Payer: Self-pay | Admitting: *Deleted

## 2021-09-23 ENCOUNTER — Inpatient Hospital Stay: Payer: Medicare Other

## 2021-09-23 ENCOUNTER — Ambulatory Visit: Payer: 59 | Admitting: Hematology and Oncology

## 2021-09-23 ENCOUNTER — Other Ambulatory Visit: Payer: Self-pay

## 2021-09-23 ENCOUNTER — Inpatient Hospital Stay (HOSPITAL_BASED_OUTPATIENT_CLINIC_OR_DEPARTMENT_OTHER): Payer: Medicare Other | Admitting: Hematology and Oncology

## 2021-09-23 ENCOUNTER — Ambulatory Visit (HOSPITAL_COMMUNITY)
Admission: RE | Admit: 2021-09-23 | Discharge: 2021-09-23 | Disposition: A | Discharge status: Home / Self Care | Payer: Medicare Other | Source: Ambulatory Visit | Attending: Hematology and Oncology | Admitting: Hematology and Oncology

## 2021-09-23 ENCOUNTER — Other Ambulatory Visit: Payer: 59

## 2021-09-23 DIAGNOSIS — Z171 Estrogen receptor negative status [ER-]: Secondary | ICD-10-CM

## 2021-09-23 DIAGNOSIS — C50411 Malignant neoplasm of upper-outer quadrant of right female breast: Secondary | ICD-10-CM | POA: Diagnosis not present

## 2021-09-23 DIAGNOSIS — Z5111 Encounter for antineoplastic chemotherapy: Secondary | ICD-10-CM | POA: Diagnosis not present

## 2021-09-23 LAB — CBC WITH DIFFERENTIAL (CANCER CENTER ONLY)
Abs Immature Granulocytes: 0.01 10*3/uL (ref 0.00–0.07)
Basophils Absolute: 0 10*3/uL (ref 0.0–0.1)
Basophils Relative: 0 %
Eosinophils Absolute: 0.4 10*3/uL (ref 0.0–0.5)
Eosinophils Relative: 6 %
HCT: 43.7 % (ref 36.0–46.0)
Hemoglobin: 14.1 g/dL (ref 12.0–15.0)
Immature Granulocytes: 0 %
Lymphocytes Relative: 51 %
Lymphs Abs: 3.5 10*3/uL (ref 0.7–4.0)
MCH: 24.2 pg — ABNORMAL LOW (ref 26.0–34.0)
MCHC: 32.3 g/dL (ref 30.0–36.0)
MCV: 75 fL — ABNORMAL LOW (ref 80.0–100.0)
Monocytes Absolute: 0.4 10*3/uL (ref 0.1–1.0)
Monocytes Relative: 6 %
Neutro Abs: 2.6 10*3/uL (ref 1.7–7.7)
Neutrophils Relative %: 37 %
Platelet Count: 245 10*3/uL (ref 150–400)
RBC: 5.83 MIL/uL — ABNORMAL HIGH (ref 3.87–5.11)
RDW: 14.9 % (ref 11.5–15.5)
WBC Count: 7 10*3/uL (ref 4.0–10.5)
nRBC: 0 % (ref 0.0–0.2)

## 2021-09-23 LAB — CMP (CANCER CENTER ONLY)
ALT: 9 U/L (ref 0–44)
AST: 16 U/L (ref 15–41)
Albumin: 4 g/dL (ref 3.5–5.0)
Alkaline Phosphatase: 93 U/L (ref 38–126)
Anion gap: 12 (ref 5–15)
BUN: 10 mg/dL (ref 8–23)
CO2: 21 mmol/L — ABNORMAL LOW (ref 22–32)
Calcium: 9.5 mg/dL (ref 8.9–10.3)
Chloride: 108 mmol/L (ref 98–111)
Creatinine: 0.69 mg/dL (ref 0.44–1.00)
GFR, Estimated: >60 mL/min (ref >60)
Glucose, Bld: 86 mg/dL (ref 70–99)
Potassium: 3.8 mmol/L (ref 3.5–5.1)
Sodium: 141 mmol/L (ref 135–145)
Total Bilirubin: 0.7 mg/dL (ref 0.3–1.2)
Total Protein: 7.9 g/dL (ref 6.5–8.1)

## 2021-09-23 LAB — POCT I-STAT CREATININE: Creatinine, Ser: 0.6 mg/dL (ref 0.44–1.00)

## 2021-09-23 MED ORDER — IOHEXOL 350 MG/ML SOLN
80.0000 mL | Freq: Once | INTRAVENOUS | Status: AC | PRN
  Administered 2021-09-23: 80 mL via INTRAVENOUS

## 2021-09-23 MED FILL — Fosaprepitant Dimeglumine For IV Infusion 150 MG (Base Eq): INTRAVENOUS | Qty: 5 | Status: AC

## 2021-09-23 MED FILL — Dexamethasone Sodium Phosphate Inj 100 MG/10ML: INTRAMUSCULAR | Qty: 1 | Status: AC

## 2021-09-23 NOTE — Progress Notes (Signed)
Per MD request referral placed for Dr. Haroldine Laws with cardiology.  Orders placed and in-basket message sent to provider and nurse.

## 2021-09-23 NOTE — Assessment & Plan Note (Signed)
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 Neoadjuvant chemotherapy with TCHP 2. Followed by breast conserving surgery with sentinel lymph node study vs targeted axillary dissection 3. 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  We will proceed with the treatment because of benefits of anti-HER2 therapy or tremendous)  Labs reviewed, chemo education completed, chemo consent obtained, antiemetics were reviewed. Return to clinic in 1 week for toxicity evaluation

## 2021-09-24 ENCOUNTER — Other Ambulatory Visit (HOSPITAL_COMMUNITY): Payer: 59

## 2021-09-24 ENCOUNTER — Encounter: Payer: Self-pay | Admitting: *Deleted

## 2021-09-24 ENCOUNTER — Inpatient Hospital Stay: Payer: Medicare Other

## 2021-09-24 ENCOUNTER — Other Ambulatory Visit: Payer: Self-pay | Admitting: Hematology and Oncology

## 2021-09-24 VITALS — BP 162/80 | HR 70 | Temp 98.7°F | Resp 16 | Wt 125.5 lb

## 2021-09-24 DIAGNOSIS — Z171 Estrogen receptor negative status [ER-]: Secondary | ICD-10-CM

## 2021-09-24 DIAGNOSIS — Z5111 Encounter for antineoplastic chemotherapy: Secondary | ICD-10-CM | POA: Diagnosis not present

## 2021-09-24 DIAGNOSIS — C50411 Malignant neoplasm of upper-outer quadrant of right female breast: Secondary | ICD-10-CM

## 2021-09-24 DIAGNOSIS — I10 Essential (primary) hypertension: Secondary | ICD-10-CM

## 2021-09-24 MED ORDER — SODIUM CHLORIDE 0.9 % IV SOLN: Freq: Once | INTRAVENOUS | Status: AC

## 2021-09-24 MED ORDER — ACETAMINOPHEN 325 MG PO TABS
650.0000 mg | ORAL_TABLET | Freq: Once | ORAL | Status: AC
  Administered 2021-09-24: 650 mg via ORAL
  Filled 2021-09-24: qty 2

## 2021-09-24 MED ORDER — PALONOSETRON HCL INJECTION 0.25 MG/5ML
0.2500 mg | Freq: Once | INTRAVENOUS | Status: AC
  Administered 2021-09-24: 0.25 mg via INTRAVENOUS
  Filled 2021-09-24: qty 5

## 2021-09-24 MED ORDER — SODIUM CHLORIDE 0.9 % IV SOLN
75.0000 mg/m2 | Freq: Once | INTRAVENOUS | Status: AC
  Administered 2021-09-24: 120 mg via INTRAVENOUS
  Filled 2021-09-24: qty 12

## 2021-09-24 MED ORDER — SODIUM CHLORIDE 0.9 % IV SOLN
450.0000 mg | Freq: Once | INTRAVENOUS | Status: AC
  Administered 2021-09-24: 450 mg via INTRAVENOUS
  Filled 2021-09-24: qty 45

## 2021-09-24 MED ORDER — SODIUM CHLORIDE 0.9% FLUSH
10.0000 mL | INTRAVENOUS | Status: DC | PRN
Start: 2021-09-24 — End: 2021-09-24
  Administered 2021-09-24: 10 mL

## 2021-09-24 MED ORDER — TRASTUZUMAB-ANNS CHEMO 150 MG IV SOLR: 8.0000 mg/kg | Freq: Once | INTRAVENOUS | Status: DC

## 2021-09-24 MED ORDER — DIPHENHYDRAMINE HCL 25 MG PO CAPS
50.0000 mg | ORAL_CAPSULE | Freq: Once | ORAL | Status: AC
  Administered 2021-09-24: 50 mg via ORAL
  Filled 2021-09-24: qty 2

## 2021-09-24 MED ORDER — SODIUM CHLORIDE 0.9 % IV SOLN
150.0000 mg | Freq: Once | INTRAVENOUS | Status: AC
  Administered 2021-09-24: 150 mg via INTRAVENOUS
  Filled 2021-09-24: qty 150

## 2021-09-24 MED ORDER — CLONIDINE HCL 0.1 MG PO TABS
0.1000 mg | ORAL_TABLET | Freq: Once | ORAL | Status: AC
  Administered 2021-09-24: 0.1 mg via ORAL
  Filled 2021-09-24: qty 1

## 2021-09-24 MED ORDER — HEPARIN SOD (PORK) LOCK FLUSH 100 UNIT/ML IV SOLN
500.0000 [IU] | Freq: Once | INTRAVENOUS | Status: AC | PRN
  Administered 2021-09-24: 500 [IU]

## 2021-09-24 MED ORDER — SODIUM CHLORIDE 0.9 % IV SOLN: 420.0000 mg | Freq: Once | INTRAVENOUS | Status: DC

## 2021-09-24 MED ORDER — SODIUM CHLORIDE 0.9 % IV SOLN
10.0000 mg | Freq: Once | INTRAVENOUS | Status: AC
  Administered 2021-09-24: 10 mg via INTRAVENOUS
  Filled 2021-09-24: qty 10

## 2021-09-24 NOTE — Patient Instructions (Signed)
[facility]   Discharge Instructions: Thank you for choosing [facility] to provide your oncology and hematology care.   If you have a lab appointment with the [facility], please go directly to the [facility] and check in at the registration area.   Wear comfortable clothing and clothing appropriate for easy access to any Portacath or PICC line.   We strive to give you quality time with your provider. You may need to reschedule your appointment if you arrive late (15 or more minutes).  Arriving late affects you and other patients whose appointments are after yours.  Also, if you miss three or more appointments without notifying the office, you may be dismissed from the clinic at the provider's discretion.      For prescription refill requests, have your pharmacy contact our office and allow 72 hours for refills to be completed.    Today you received the following chemotherapy and/or immunotherapy agents: docetaxel and carboplatin.   To help prevent nausea and vomiting after your treatment, we encourage you to take your nausea medication as directed.  BELOW ARE SYMPTOMS THAT SHOULD BE REPORTED IMMEDIATELY: *FEVER GREATER THAN 100.4 F (38 C) OR HIGHER *CHILLS OR SWEATING *NAUSEA AND VOMITING THAT IS NOT CONTROLLED WITH YOUR NAUSEA MEDICATION *UNUSUAL SHORTNESS OF BREATH *UNUSUAL BRUISING OR BLEEDING *URINARY PROBLEMS (pain or burning when urinating, or frequent urination) *BOWEL PROBLEMS (unusual diarrhea, constipation, pain near the anus) TENDERNESS IN MOUTH AND THROAT WITH OR WITHOUT PRESENCE OF ULCERS (sore throat, sores in mouth, or a toothache) UNUSUAL RASH, SWELLING OR PAIN  UNUSUAL VAGINAL DISCHARGE OR ITCHING   Items with * indicate a potential emergency and should be followed up as soon as possible or go to the Emergency Department if any problems should occur.  Please show the CHEMOTHERAPY ALERT CARD or IMMUNOTHERAPY ALERT CARD  at check-in to the Emergency Department and triage nurse.  Should you have questions after your visit or need to cancel or reschedule your appointment, please contact [facility]  Dept: [phone]  and follow the prompts.  Office hours are 8:00 a.m. to 4:30 p.m. Monday - Friday. Please note that voicemails left after 4:00 p.m. may not be returned until the following business day.  We are closed weekends and major holidays. You have access to a nurse at all times for urgent questions. Please call the main number to the clinic Dept: [phone] and follow the prompts.   For any non-urgent questions, you may also contact your provider using MyChart. We now offer e-Visits for anyone 65 and older to request care online for non-urgent symptoms. For details visit [email or web address].   Also download the MyChart app! Go to the app store, search "MyChart", open the app, select [facility], and log in with your MyChart username and password.  Due to Covid, a mask is required upon entering the hospital/clinic. If you do not have a mask, one will be given to you upon arrival. For doctor visits, patients may have 1 support person aged 65 or older with them. For treatment visits, patients cannot have anyone with them due to current Covid guidelines and our immunocompromised population.   Docetaxel injection What is this medication? DOCETAXEL (doe se TAX el) is a chemotherapy drug. It targets fast dividing cells, like cancer cells, and causes these cells to die. This medicine is used to treat many types of cancers like breast cancer, certain stomach cancers, head and neck cancer,  lung cancer, and prostate cancer. This medicine may be used for other purposes; ask your health care provider or pharmacist if you have questions. COMMON BRAND NAME(S): Docefrez, Taxotere What should I tell my care team before I take this medication? They need to know if you have any of these  conditions: infection (especially a virus infection such as chickenpox, cold sores, or herpes) liver disease low blood counts, like low white cell, platelet, or red cell counts an unusual or allergic reaction to docetaxel, polysorbate 80, other chemotherapy agents, other medicines, foods, dyes, or preservatives pregnant or trying to get pregnant breast-feeding How should I use this medication? This drug is given as an infusion into a vein. It is administered in a hospital or clinic by a specially trained health care professional. Talk to your pediatrician regarding the use of this medicine in children. Special care may be needed. Overdosage: If you think you have taken too much of this medicine contact a poison control center or emergency room at once. NOTE: This medicine is only for you. Do not share this medicine with others. What if I miss a dose? It is important not to miss your dose. Call your doctor or health care professional if you are unable to keep an appointment. What may interact with this medication? Do not take this medicine with any of the following medications: live virus vaccines This medicine may also interact with the following medications: aprepitant certain antibiotics like erythromycin or clarithromycin certain antivirals for HIV or hepatitis certain medicines for fungal infections like fluconazole, itraconazole, ketoconazole, posaconazole, or voriconazole cimetidine ciprofloxacin conivaptan cyclosporine dronedarone fluvoxamine grapefruit juice imatinib verapamil This list may not describe all possible interactions. Give your health care provider a list of all the medicines, herbs, non-prescription drugs, or dietary supplements you use. Also tell them if you smoke, drink alcohol, or use illegal drugs. Some items may interact with your medicine. What should I watch for while using this medication? Your condition will be monitored carefully while you are receiving  this medicine. You will need important blood work done while you are taking this medicine. Call your doctor or health care professional for advice if you get a fever, chills or sore throat, or other symptoms of a cold or flu. Do not treat yourself. This drug decreases your body's ability to fight infections. Try to avoid being around people who are sick. Some products may contain alcohol. Ask your health care professional if this medicine contains alcohol. Be sure to tell all health care professionals you are taking this medicine. Certain medicines, like metronidazole and disulfiram, can cause an unpleasant reaction when taken with alcohol. The reaction includes flushing, headache, nausea, vomiting, sweating, and increased thirst. The reaction can last from 30 minutes to several hours. You may get drowsy or dizzy. Do not drive, use machinery, or do anything that needs mental alertness until you know how this medicine affects you. Do not stand or sit up quickly, especially if you are an older patient. This reduces the risk of dizzy or fainting spells. Alcohol may interfere with the effect of this medicine. Talk to your health care professional about your risk of cancer. You may be more at risk for certain types of cancer if you take this medicine. Do not become pregnant while taking this medicine or for 6 months after stopping it. Women should inform their doctor if they wish to become pregnant or think they might be pregnant. There is a potential for serious side effects to  an unborn child. Talk to your health care professional or pharmacist for more information. Do not breast-feed an infant while taking this medicine or for 1 week after stopping it. Males who get this medicine must use a condom during sex with females who can get pregnant. If you get a woman pregnant, the baby could have birth defects. The baby could die before they are born. You will need to continue wearing a condom for 3 months after  stopping the medicine. Tell your health care provider right away if your partner becomes pregnant while you are taking this medicine. This may interfere with the ability to father a child. You should talk to your doctor or health care professional if you are concerned about your fertility. What side effects may I notice from receiving this medication? Side effects that you should report to your doctor or health care professional as soon as possible: allergic reactions like skin rash, itching or hives, swelling of the face, lips, or tongue blurred vision breathing problems changes in vision low blood counts - This drug may decrease the number of white blood cells, red blood cells and platelets. You may be at increased risk for infections and bleeding. nausea and vomiting pain, redness or irritation at site where injected pain, tingling, numbness in the hands or feet redness, blistering, peeling, or loosening of the skin, including inside the mouth signs of decreased platelets or bleeding - bruising, pinpoint red spots on the skin, black, tarry stools, nosebleeds signs of decreased red blood cells - unusually weak or tired, fainting spells, lightheadedness signs of infection - fever or chills, cough, sore throat, pain or difficulty passing urine swelling of the ankle, feet, hands Side effects that usually do not require medical attention (report to your doctor or health care professional if they continue or are bothersome): constipation diarrhea fingernail or toenail changes hair loss loss of appetite mouth sores muscle pain This list may not describe all possible side effects. Call your doctor for medical advice about side effects. You may report side effects to FDA at 1-800-FDA-1088. Where should I keep my medication? This drug is given in a hospital or clinic and will not be stored at home. NOTE: This sheet is a summary. It may not cover all possible information. If you have questions  about this medicine, talk to your doctor, pharmacist, or health care provider.  2022 Elsevier/Gold Standard (2019-10-14 19:50:31)  Carboplatin injection What is this medication? CARBOPLATIN (KAR boe pla tin) is a chemotherapy drug. It targets fast dividing cells, like cancer cells, and causes these cells to die. This medicine is used to treat ovarian cancer and many other cancers. This medicine may be used for other purposes; ask your health care provider or pharmacist if you have questions. COMMON BRAND NAME(S): Paraplatin What should I tell my care team before I take this medication? They need to know if you have any of these conditions: blood disorders hearing problems kidney disease recent or ongoing radiation therapy an unusual or allergic reaction to carboplatin, cisplatin, other chemotherapy, other medicines, foods, dyes, or preservatives pregnant or trying to get pregnant breast-feeding How should I use this medication? This drug is usually given as an infusion into a vein. It is administered in a hospital or clinic by a specially trained health care professional. Talk to your pediatrician regarding the use of this medicine in children. Special care may be needed. Overdosage: If you think you have taken too much of this medicine contact a poison control  center or emergency room at once. NOTE: This medicine is only for you. Do not share this medicine with others. What if I miss a dose? It is important not to miss a dose. Call your doctor or health care professional if you are unable to keep an appointment. What may interact with this medication? medicines for seizures medicines to increase blood counts like filgrastim, pegfilgrastim, sargramostim some antibiotics like amikacin, gentamicin, neomycin, streptomycin, tobramycin vaccines Talk to your doctor or health care professional before taking any of these medicines: acetaminophen aspirin ibuprofen ketoprofen naproxen This  list may not describe all possible interactions. Give your health care provider a list of all the medicines, herbs, non-prescription drugs, or dietary supplements you use. Also tell them if you smoke, drink alcohol, or use illegal drugs. Some items may interact with your medicine. What should I watch for while using this medication? Your condition will be monitored carefully while you are receiving this medicine. You will need important blood work done while you are taking this medicine. This drug may make you feel generally unwell. This is not uncommon, as chemotherapy can affect healthy cells as well as cancer cells. Report any side effects. Continue your course of treatment even though you feel ill unless your doctor tells you to stop. In some cases, you may be given additional medicines to help with side effects. Follow all directions for their use. Call your doctor or health care professional for advice if you get a fever, chills or sore throat, or other symptoms of a cold or flu. Do not treat yourself. This drug decreases your body's ability to fight infections. Try to avoid being around people who are sick. This medicine may increase your risk to bruise or bleed. Call your doctor or health care professional if you notice any unusual bleeding. Be careful brushing and flossing your teeth or using a toothpick because you may get an infection or bleed more easily. If you have any dental work done, tell your dentist you are receiving this medicine. Avoid taking products that contain aspirin, acetaminophen, ibuprofen, naproxen, or ketoprofen unless instructed by your doctor. These medicines may hide a fever. Do not become pregnant while taking this medicine. Women should inform their doctor if they wish to become pregnant or think they might be pregnant. There is a potential for serious side effects to an unborn child. Talk to your health care professional or pharmacist for more information. Do not  breast-feed an infant while taking this medicine. What side effects may I notice from receiving this medication? Side effects that you should report to your doctor or health care professional as soon as possible: allergic reactions like skin rash, itching or hives, swelling of the face, lips, or tongue signs of infection - fever or chills, cough, sore throat, pain or difficulty passing urine signs of decreased platelets or bleeding - bruising, pinpoint red spots on the skin, black, tarry stools, nosebleeds signs of decreased red blood cells - unusually weak or tired, fainting spells, lightheadedness breathing problems changes in hearing changes in vision chest pain high blood pressure low blood counts - This drug may decrease the number of white blood cells, red blood cells and platelets. You may be at increased risk for infections and bleeding. nausea and vomiting pain, swelling, redness or irritation at the injection site pain, tingling, numbness in the hands or feet problems with balance, talking, walking trouble passing urine or change in the amount of urine Side effects that usually do not require  medical attention (report to your doctor or health care professional if they continue or are bothersome): hair loss loss of appetite metallic taste in the mouth or changes in taste This list may not describe all possible side effects. Call your doctor for medical advice about side effects. You may report side effects to FDA at 1-800-FDA-1088. Where should I keep my medication? This drug is given in a hospital or clinic and will not be stored at home. NOTE: This sheet is a summary. It may not cover all possible information. If you have questions about this medicine, talk to your doctor, pharmacist, or health care provider.  2022 Elsevier/Gold Standard (2008-02-19 14:38:05)

## 2021-09-24 NOTE — Progress Notes (Signed)
PA still pending for [place]. Dr. Lindi Adie aware and ok to proceed w/ Taxotere & Carboplatin today without Kanjinti & Perjeta.  Kennith Center, Pharm.D., CPP 09/24/2021@9 :33 AM

## 2021-09-27 ENCOUNTER — Inpatient Hospital Stay: Payer: Medicare Other

## 2021-09-27 ENCOUNTER — Encounter: Payer: Self-pay | Admitting: Adult Health

## 2021-09-27 ENCOUNTER — Other Ambulatory Visit: Payer: 59

## 2021-09-27 ENCOUNTER — Ambulatory Visit (HOSPITAL_COMMUNITY): Payer: 59

## 2021-09-27 ENCOUNTER — Encounter: Payer: Self-pay | Admitting: *Deleted

## 2021-09-27 ENCOUNTER — Other Ambulatory Visit: Payer: Self-pay

## 2021-09-27 VITALS — HR 81 | Temp 98.9°F | Resp 16

## 2021-09-27 DIAGNOSIS — Z5111 Encounter for antineoplastic chemotherapy: Secondary | ICD-10-CM | POA: Diagnosis not present

## 2021-09-27 DIAGNOSIS — C50411 Malignant neoplasm of upper-outer quadrant of right female breast: Secondary | ICD-10-CM

## 2021-09-27 MED ORDER — PEGFILGRASTIM-CBQV 6 MG/0.6ML ~~LOC~~ SOSY
6.0000 mg | PREFILLED_SYRINGE | Freq: Once | SUBCUTANEOUS | Status: AC
  Administered 2021-09-27: 6 mg via SUBCUTANEOUS
  Filled 2021-09-27: qty 0.6

## 2021-09-27 NOTE — Progress Notes (Signed)
Patients B/P 189/93 informed MD asked if he wanted her to receive her udenyca injection

## 2021-09-27 NOTE — Progress Notes (Signed)
Met with patient at registration to introduce myself as Arboriculturist and to offer available resources.  Discussed one-time $1000 Radio broadcast assistant to assist with personal expenses while going through treatment. Advised what is needed to apply and she states she can bring at next visit on 11/3.  Gave her my card if interested in applying and for any additional financial questions or concerns.

## 2021-09-28 ENCOUNTER — Encounter (HOSPITAL_COMMUNITY)
Admission: RE | Admit: 2021-09-28 | Discharge: 2021-09-28 | Disposition: A | Discharge status: Home / Self Care | Payer: Medicare Other | Source: Ambulatory Visit | Attending: Hematology and Oncology | Admitting: Hematology and Oncology

## 2021-09-28 DIAGNOSIS — C50411 Malignant neoplasm of upper-outer quadrant of right female breast: Secondary | ICD-10-CM | POA: Diagnosis not present

## 2021-09-28 DIAGNOSIS — Z171 Estrogen receptor negative status [ER-]: Secondary | ICD-10-CM | POA: Diagnosis present

## 2021-09-28 MED ORDER — TECHNETIUM TC 99M MEDRONATE IV KIT
20.0000 | PACK | Freq: Once | INTRAVENOUS | Status: AC | PRN
  Administered 2021-09-28: 21.5 via INTRAVENOUS

## 2021-09-29 ENCOUNTER — Encounter: Payer: Self-pay | Admitting: *Deleted

## 2021-09-30 ENCOUNTER — Other Ambulatory Visit: Payer: Self-pay

## 2021-09-30 ENCOUNTER — Ambulatory Visit: Admit: 2021-09-30 | Payer: 59 | Admitting: Surgery

## 2021-09-30 ENCOUNTER — Inpatient Hospital Stay (HOSPITAL_BASED_OUTPATIENT_CLINIC_OR_DEPARTMENT_OTHER): Payer: 59 | Admitting: Adult Health

## 2021-09-30 ENCOUNTER — Inpatient Hospital Stay: Payer: 59 | Attending: Hematology and Oncology

## 2021-09-30 ENCOUNTER — Encounter: Payer: Self-pay | Admitting: Adult Health

## 2021-09-30 VITALS — BP 169/84 | HR 79 | Temp 98.1°F | Resp 18 | Ht 65.0 in | Wt 126.1 lb

## 2021-09-30 DIAGNOSIS — Z5189 Encounter for other specified aftercare: Secondary | ICD-10-CM | POA: Insufficient documentation

## 2021-09-30 DIAGNOSIS — Z452 Encounter for adjustment and management of vascular access device: Secondary | ICD-10-CM | POA: Insufficient documentation

## 2021-09-30 DIAGNOSIS — Z5112 Encounter for antineoplastic immunotherapy: Secondary | ICD-10-CM | POA: Insufficient documentation

## 2021-09-30 DIAGNOSIS — C50411 Malignant neoplasm of upper-outer quadrant of right female breast: Secondary | ICD-10-CM | POA: Diagnosis present

## 2021-09-30 DIAGNOSIS — Z171 Estrogen receptor negative status [ER-]: Secondary | ICD-10-CM

## 2021-09-30 DIAGNOSIS — Z5111 Encounter for antineoplastic chemotherapy: Secondary | ICD-10-CM | POA: Diagnosis present

## 2021-09-30 DIAGNOSIS — Z95828 Presence of other vascular implants and grafts: Secondary | ICD-10-CM | POA: Insufficient documentation

## 2021-09-30 LAB — CMP (CANCER CENTER ONLY)
ALT: 10 U/L (ref 0–44)
AST: 12 U/L — ABNORMAL LOW (ref 15–41)
Albumin: 3.4 g/dL — ABNORMAL LOW (ref 3.5–5.0)
Alkaline Phosphatase: 81 U/L (ref 38–126)
Anion gap: 9 (ref 5–15)
BUN: 6 mg/dL — ABNORMAL LOW (ref 8–23)
CO2: 26 mmol/L (ref 22–32)
Calcium: 8.5 mg/dL — ABNORMAL LOW (ref 8.9–10.3)
Chloride: 103 mmol/L (ref 98–111)
Creatinine: 0.58 mg/dL (ref 0.44–1.00)
GFR, Estimated: >60 mL/min (ref >60)
Glucose, Bld: 92 mg/dL (ref 70–99)
Potassium: 3.2 mmol/L — ABNORMAL LOW (ref 3.5–5.1)
Sodium: 138 mmol/L (ref 135–145)
Total Bilirubin: 0.7 mg/dL (ref 0.3–1.2)
Total Protein: 6.6 g/dL (ref 6.5–8.1)

## 2021-09-30 LAB — CBC WITH DIFFERENTIAL (CANCER CENTER ONLY)
Abs Immature Granulocytes: 0.34 10*3/uL — ABNORMAL HIGH (ref 0.00–0.07)
Basophils Absolute: 0.1 10*3/uL (ref 0.0–0.1)
Basophils Relative: 1 %
Eosinophils Absolute: 0.1 10*3/uL (ref 0.0–0.5)
Eosinophils Relative: 2 %
HCT: 36 % (ref 36.0–46.0)
Hemoglobin: 12 g/dL (ref 12.0–15.0)
Immature Granulocytes: 8 %
Lymphocytes Relative: 62 %
Lymphs Abs: 2.6 10*3/uL (ref 0.7–4.0)
MCH: 24.7 pg — ABNORMAL LOW (ref 26.0–34.0)
MCHC: 33.3 g/dL (ref 30.0–36.0)
MCV: 74.1 fL — ABNORMAL LOW (ref 80.0–100.0)
Monocytes Absolute: 0.2 10*3/uL (ref 0.1–1.0)
Monocytes Relative: 5 %
Neutro Abs: 0.9 10*3/uL — ABNORMAL LOW (ref 1.7–7.7)
Neutrophils Relative %: 22 %
Platelet Count: 192 10*3/uL (ref 150–400)
RBC: 4.86 MIL/uL (ref 3.87–5.11)
RDW: 13.8 % (ref 11.5–15.5)
Smear Review: NORMAL
WBC Count: 4.2 10*3/uL (ref 4.0–10.5)
nRBC: 0 % (ref 0.0–0.2)

## 2021-09-30 SURGERY — INSERTION, TUNNELED CENTRAL VENOUS DEVICE, WITH PORT
Anesthesia: General | Site: Breast

## 2021-09-30 MED ORDER — HEPARIN SOD (PORK) LOCK FLUSH 100 UNIT/ML IV SOLN
500.0000 [IU] | Freq: Once | INTRAVENOUS | Status: AC
  Administered 2021-09-30: 500 [IU]

## 2021-09-30 MED ORDER — SODIUM CHLORIDE 0.9% FLUSH
10.0000 mL | Freq: Once | INTRAVENOUS | Status: AC
  Administered 2021-09-30: 10 mL

## 2021-09-30 NOTE — Progress Notes (Signed)
[facility] Follow up:    Pcp, No No address on file   DIAGNOSIS: Cancer Staging Malignant neoplasm of upper-outer quadrant of right breast in female, estrogen receptor negative ([place]) Staging form: Breast, AJCC 8th Edition - Clinical stage from 09/16/2021: Stage IIIB (cT4d, cN1, cM0, G3, ER-, PR-, HER2+) - Signed by Nicholas Lose, MD on 09/16/2021 Stage prefix: Initial diagnosis Histologic grading system: 3 grade system   SUMMARY OF ONCOLOGIC HISTORY: Oncology History  Malignant neoplasm of upper-outer quadrant of right breast in female, estrogen receptor negative ([place])  09/10/2021 Initial Diagnosis   Right breast thickening and swelling: Mammogram revealed extremely hard right breast with peau d'orange skin changes, ultrasound revealed very large irregular mass measuring 11 cm with diffuse skin thickening, 3 abnormal right axillary lymph nodes, biopsy revealed grade 3 invasive mammary cancer with pleomorphic features, lymph node positive, ER 0%, PR 0%, HER2 3+, Ki-67 30%   09/16/2021 Cancer Staging   Staging form: Breast, AJCC 8th Edition - Clinical stage from 09/16/2021: Stage IIIB (cT4d, cN1, cM0, G3, ER-, PR-, HER2+) - Signed by Nicholas Lose, MD on 09/16/2021 Stage prefix: Initial diagnosis Histologic grading system: 3 grade system    09/24/2021 -  Chemotherapy   Patient is on Treatment Plan : BREAST  Docetaxel + Carboplatin + Trastuzumab + Pertuzumab  (TCHP) q21d        CURRENT THERAPY: TCHP cycle 1 day 8  INTERVAL HISTORY: Lynn Mcgee 65 y.o. female returns for evaluation after receiving her first cycle of chemotherapy with TCHP.  Since her last visit Lynn underwent CT chest abdomen and pelvis on September 23, 2021.  Lynn also underwent bone scan on September 28, 2021.  Both showed no sign of metastatic disease.  Lynn Mcgee only received the Taxotere and Cytoxan last week.  Lynn is due to receive the Herceptin and Perjeta tomorrow.  Per Dr. Mariann Laster with her heart  function mildly decreased, Lynn is to proceed with therapy as he believes the risks outweigh the benefits.  Jode says that this is her interpretation of their discussion about risks and benefits as well and Lynn is willing to proceed with treatment.  Lekesha feels fatigued.  Lynn has had some nausea which Lynn has been relieved by Compazine.  Lynn also notes some constipation.   Patient Active Problem List   Diagnosis Date Noted   Port-A-Cath in place 09/30/2021   Malignant neoplasm of upper-outer quadrant of right breast in female, estrogen receptor negative ([place]) 09/16/2021   Porokeratosis 10/17/2014   Verruca 10/17/2014   Pain in lower limb 10/17/2014    has No Known Allergies.  MEDICAL HISTORY: History reviewed. No pertinent past medical history.  SURGICAL HISTORY: Past Surgical History:  Procedure Laterality Date   IR IMAGING GUIDED PORT INSERTION  09/17/2021    SOCIAL HISTORY: Social History   Socioeconomic History   Marital status: Single    Spouse name: Not on file   Number of children: Not on file   Years of education: Not on file   Highest education level: Not on file  Occupational History   Not on file  Tobacco Use   Smoking status: Every Day    Types: Cigarettes   Smokeless tobacco: Never  Substance and Sexual Activity   Alcohol use: Not on file   Drug use: Not on file   Sexual activity: Not on file  Other Topics Concern   Not on file  Social History Narrative   Not on file   Social Determinants  of Health   Financial Resource Strain: Not on file  Food Insecurity: Not on file  Transportation Needs: Not on file  Physical Activity: Not on file  Stress: Not on file  Social Connections: Not on file  Intimate Partner Violence: Not on file    FAMILY HISTORY: History reviewed. No pertinent family history.  Review of Systems  Constitutional:  Positive for fatigue. Negative for appetite change, chills, fever and unexpected weight change.  HENT:    Negative for hearing loss, lump/mass and trouble swallowing.   Eyes:  Negative for eye problems and icterus.  Respiratory:  Negative for chest tightness, cough and shortness of breath.   Cardiovascular:  Negative for chest pain, leg swelling and palpitations.  Gastrointestinal:  Positive for constipation and nausea. Negative for abdominal distention, abdominal pain, diarrhea and vomiting.  Endocrine: Negative for hot flashes.  Genitourinary:  Negative for difficulty urinating.   Musculoskeletal:  Negative for arthralgias.  Skin:  Negative for itching and rash.  Neurological:  Negative for dizziness, extremity weakness, headaches and numbness.  Hematological:  Negative for adenopathy. Does not bruise/bleed easily.  Psychiatric/Behavioral:  Negative for depression. The patient is not nervous/anxious.      PHYSICAL EXAMINATION  ECOG PERFORMANCE STATUS: 1 - Symptomatic but completely ambulatory  Vitals:   09/30/21 1423  BP: (!) 169/84  Pulse: 79  Resp: 18  Temp: 98.1 F (36.7 C)  SpO2: 96%    Physical Exam Constitutional:      General: Lynn is not in acute distress.    Appearance: Normal appearance. Lynn is not toxic-appearing.  HENT:     Head: Normocephalic and atraumatic.  Eyes:     General: No scleral icterus. Cardiovascular:     Rate and Rhythm: Normal rate and regular rhythm.     Pulses: Normal pulses.     Heart sounds: Normal heart sounds.  Pulmonary:     Effort: Pulmonary effort is normal.     Breath sounds: Normal breath sounds.  Abdominal:     General: Abdomen is flat. Bowel sounds are normal. There is no distension.     Palpations: Abdomen is soft.     Tenderness: There is no abdominal tenderness.  Musculoskeletal:        General: No swelling.     Cervical back: Neck supple.  Lymphadenopathy:     Cervical: No cervical adenopathy.  Skin:    General: Skin is warm and dry.     Findings: No rash.  Neurological:     General: No focal deficit present.     Mental  Status: Lynn is alert.  Psychiatric:        Mood and Affect: Mood normal.        Behavior: Behavior normal.    LABORATORY DATA:  CBC    Component Value Date/Time   WBC 4.2 09/30/2021 1414   RBC 4.86 09/30/2021 1414   HGB 12.0 09/30/2021 1414   HCT 36.0 09/30/2021 1414   PLT 192 09/30/2021 1414   MCV 74.1 (L) 09/30/2021 1414   MCH 24.7 (L) 09/30/2021 1414   MCHC 33.3 09/30/2021 1414   RDW 13.8 09/30/2021 1414   LYMPHSABS 2.6 09/30/2021 1414   MONOABS 0.2 09/30/2021 1414   EOSABS 0.1 09/30/2021 1414   BASOSABS 0.1 09/30/2021 1414    CMP     Component Value Date/Time   NA 138 09/30/2021 1414   K 3.2 (L) 09/30/2021 1414   CL 103 09/30/2021 1414   CO2 26 09/30/2021 1414  GLUCOSE 92 09/30/2021 1414   BUN 6 (L) 09/30/2021 1414   CREATININE 0.58 09/30/2021 1414   CALCIUM 8.5 (L) 09/30/2021 1414   PROT 6.6 09/30/2021 1414   ALBUMIN 3.4 (L) 09/30/2021 1414   AST 12 (L) 09/30/2021 1414   ALT 10 09/30/2021 1414   ALKPHOS 81 09/30/2021 1414   BILITOT 0.7 09/30/2021 1414   GFRNONAA >60 09/30/2021 1414      ASSESSMENT and THERAPY PLAN:   Malignant neoplasm of upper-outer quadrant of right breast in female, estrogen receptor negative ([place]) 09/10/2021 right breast thickening and swelling: Mammogram revealed extremely hard right breast with peau d'orange skin changes, ultrasound revealed very large irregular mass measuring 11 cm with diffuse skin thickening, 3 abnormal right axillary lymph nodes, biopsy revealed grade 3 invasive mammary cancer with pleomorphic features, lymph node positive, ER 0%, PR 0%, HER2 3+, Ki-67 30% Stage IIIb  Treatment plan: 1. Neoadjuvant chemotherapy with TCHP 2. Followed by breast conserving surgery with sentinel lymph node study vs targeted axillary dissection 3. 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 Lindi Adie.   Lynn Mcgee returns today for follow-up after receiving Taxotere and Cytoxan.  Lynn has an ANC of 0.9.  Lynn is not at a high risk of infection.  I reviewed her labs with her in detail.  Lynn Mcgee will return tomorrow to proceed with Herceptin and Perjeta.  Lynn understands the risks and benefits.  In particular we discussed her decreased heart ejection fraction and the fact that it could further be reduced by the Herceptin and Perjeta.  I reviewed that Dr. Lindi Adie strongly believes that the benefits outweigh the risk.  Lynn has follow-up with Dr. Haroldine Laws next week.  I explained that Dr. Haroldine Laws is a well-known cardiologist who has a lot of experience with Herceptin patients and heart issues.  I reviewed that he will be able to look at the echocardiograms and help identify worsening ejection fraction with the goal of it being identified before it happens..  After discussion in detail Lynn Mcgee has agreed to proceed with the treatment tomorrow.  I reviewed [name]'s scans with her in detail.  Lynn has a large breast mass and lymph nodes that are involved with cancer in her right axilla however the rest of her scans are negative for metastatic disease.  This is good news.  Lynn Mcgee also discussed her constipation.  The Herceptin and Perjeta can cause diarrhea.  Therefore Lynn will hold off on taking anything for her bowels.  Lynn knows to call me should Lynn have any issues and need any advice and Lynn can always come and see [place] before her next appointment as well.  Lynn Mcgee will return on November 25 for labs follow-up and her next infusion.     All  questions were answered. The patient knows to call the clinic with any problems, questions or concerns. We can certainly see the patient much sooner if necessary.  Total encounter time: 30 minutes in face-to-face visit time, chart review, lab review, order entry, care coordination, and documentation of the encounter.  Lynn Bihari, NP 09/30/21 3:40 PM Medical Oncology and Hematology [facility] [place]  [place], [place] [address] Tel. [phone]    Fax. [phone]  *Total Encounter Time as defined by the Centers for Medicare and Medicaid Services includes, in addition to the face-to-face time of a patient visit (documented in the note above) non-face-to-face time: obtaining and reviewing outside history, ordering and reviewing medications, tests or procedures, care coordination (communications with other health care professionals or caregivers) and documentation in the medical record.

## 2021-09-30 NOTE — Assessment & Plan Note (Addendum)
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 Neoadjuvant chemotherapy with TCHP 2. Followed by breast conserving surgery with sentinel lymph node study vs targeted axillary dissection 3. 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 Lindi Adie.   Lynn Mcgee returns today for follow-up after receiving Taxotere and Cytoxan.  She has an ANC of 0.9.  She is not at a high risk of infection.  I reviewed her labs with her in detail.  Kalene will return tomorrow to proceed with Herceptin and Perjeta.  She understands the risks and benefits.  In particular we discussed her decreased heart ejection fraction and the fact that it could further be reduced by the Herceptin and Perjeta.  I reviewed that Dr. Lindi Adie strongly believes that the benefits outweigh the risk.  She has follow-up with Dr. Haroldine Laws next week.  I explained that Dr. Haroldine Laws is a well-known cardiologist who has a lot of experience with Herceptin patients and heart issues.  I reviewed that he will be able to look at the echocardiograms and help identify worsening ejection fraction with  the goal of it being identified before it happens..  After discussion in detail Lynn Mcgee has agreed to proceed with the treatment tomorrow.  I reviewed Lynn Mcgee's scans with her in detail.  She has a large breast mass and lymph nodes that are involved with cancer in her right axilla however the rest of her scans are negative for metastatic disease.  This is good news.  Lynn Mcgee also discussed her constipation.  The Herceptin and Perjeta can cause diarrhea.  Therefore she will hold off on taking anything for her bowels.  She knows to call me should she have any issues and need any advice and she can always come and see [place] before her next appointment as well.  Lynn Mcgee will return on November 25 for labs follow-up and her next infusion.

## 2021-10-01 ENCOUNTER — Other Ambulatory Visit: Payer: Self-pay

## 2021-10-01 ENCOUNTER — Encounter: Payer: Self-pay | Admitting: *Deleted

## 2021-10-01 ENCOUNTER — Inpatient Hospital Stay: Payer: 59

## 2021-10-01 VITALS — BP 126/72 | HR 74 | Temp 98.4°F | Resp 18 | Wt 124.5 lb

## 2021-10-01 DIAGNOSIS — C50411 Malignant neoplasm of upper-outer quadrant of right female breast: Secondary | ICD-10-CM

## 2021-10-01 DIAGNOSIS — Z171 Estrogen receptor negative status [ER-]: Secondary | ICD-10-CM

## 2021-10-01 DIAGNOSIS — Z5112 Encounter for antineoplastic immunotherapy: Secondary | ICD-10-CM | POA: Diagnosis not present

## 2021-10-01 MED ORDER — SODIUM CHLORIDE 0.9% FLUSH
10.0000 mL | INTRAVENOUS | Status: DC | PRN
  Administered 2021-10-01: 10 mL

## 2021-10-01 MED ORDER — TRASTUZUMAB-ANNS CHEMO 150 MG IV SOLR
8.0000 mg/kg | Freq: Once | INTRAVENOUS | Status: AC
  Administered 2021-10-01: 462 mg via INTRAVENOUS
  Filled 2021-10-01: qty 22

## 2021-10-01 MED ORDER — ACETAMINOPHEN 325 MG PO TABS
650.0000 mg | ORAL_TABLET | Freq: Once | ORAL | Status: AC
  Administered 2021-10-01: 650 mg via ORAL
  Filled 2021-10-01: qty 2

## 2021-10-01 MED ORDER — HEPARIN SOD (PORK) LOCK FLUSH 100 UNIT/ML IV SOLN
500.0000 [IU] | Freq: Once | INTRAVENOUS | Status: AC | PRN
  Administered 2021-10-01: 500 [IU]

## 2021-10-01 MED ORDER — DIPHENHYDRAMINE HCL 25 MG PO CAPS
50.0000 mg | ORAL_CAPSULE | Freq: Once | ORAL | Status: AC
  Administered 2021-10-01: 50 mg via ORAL
  Filled 2021-10-01: qty 2

## 2021-10-01 MED ORDER — SODIUM CHLORIDE 0.9 % IV SOLN: Freq: Once | INTRAVENOUS | Status: AC

## 2021-10-01 MED ORDER — SODIUM CHLORIDE 0.9 % IV SOLN
420.0000 mg | Freq: Once | INTRAVENOUS | Status: AC
  Administered 2021-10-01: 420 mg via INTRAVENOUS
  Filled 2021-10-01: qty 14

## 2021-10-01 NOTE — Patient Instructions (Signed)
[facility]  Discharge Instructions: Thank you for choosing [facility] to provide your oncology and hematology care.   If you have a lab appointment with the [facility], please go directly to the [facility] and check in at the registration area.   Wear comfortable clothing and clothing appropriate for easy access to any Portacath or PICC line.   We strive to give you quality time with your provider. You may need to reschedule your appointment if you arrive late (15 or more minutes).  Arriving late affects you and other patients whose appointments are after yours.  Also, if you miss three or more appointments without notifying the office, you may be dismissed from the clinic at the provider's discretion.      For prescription refill requests, have your pharmacy contact our office and allow 72 hours for refills to be completed.    Today you received the following chemotherapy and/or immunotherapy agents trastuzumab and perjeta      To help prevent nausea and vomiting after your treatment, we encourage you to take your nausea medication as directed.  BELOW ARE SYMPTOMS THAT SHOULD BE REPORTED IMMEDIATELY: *FEVER GREATER THAN 100.4 F (38 C) OR HIGHER *CHILLS OR SWEATING *NAUSEA AND VOMITING THAT IS NOT CONTROLLED WITH YOUR NAUSEA MEDICATION *UNUSUAL SHORTNESS OF BREATH *UNUSUAL BRUISING OR BLEEDING *URINARY PROBLEMS (pain or burning when urinating, or frequent urination) *BOWEL PROBLEMS (unusual diarrhea, constipation, pain near the anus) TENDERNESS IN MOUTH AND THROAT WITH OR WITHOUT PRESENCE OF ULCERS (sore throat, sores in mouth, or a toothache) UNUSUAL RASH, SWELLING OR PAIN  UNUSUAL VAGINAL DISCHARGE OR ITCHING   Items with * indicate a potential emergency and should be followed up as soon as possible or go to the Emergency Department if any problems should occur.  Please show the CHEMOTHERAPY ALERT CARD or IMMUNOTHERAPY ALERT CARD at  check-in to the Emergency Department and triage nurse.  Should you have questions after your visit or need to cancel or reschedule your appointment, please contact [facility]  Dept: [phone]  and follow the prompts.  Office hours are 8:00 a.m. to 4:30 p.m. Monday - Friday. Please note that voicemails left after 4:00 p.m. may not be returned until the following business day.  We are closed weekends and major holidays. You have access to a nurse at all times for urgent questions. Please call the main number to the clinic Dept: [phone] and follow the prompts.   For any non-urgent questions, you may also contact your provider using MyChart. We now offer e-Visits for anyone 7 and older to request care online for non-urgent symptoms. For details visit [email or web address].   Also download the MyChart app! Go to the app store, search "MyChart", open the app, select [facility], and log in with your MyChart username and password.  Due to Covid, a mask is required upon entering the hospital/clinic. If you do not have a mask, one will be given to you upon arrival. For doctor visits, patients may have 1 support person aged 90 or older with them. For treatment visits, patients cannot have anyone with them due to current Covid guidelines and our immunocompromised population.

## 2021-10-04 ENCOUNTER — Other Ambulatory Visit: Payer: 59

## 2021-10-04 ENCOUNTER — Ambulatory Visit: Payer: 59 | Admitting: Hematology and Oncology

## 2021-10-06 ENCOUNTER — Ambulatory Visit (HOSPITAL_COMMUNITY)
Admission: RE | Admit: 2021-10-06 | Discharge: 2021-10-06 | Disposition: A | Discharge status: Home / Self Care | Payer: Medicare Other | Source: Ambulatory Visit | Attending: Internal Medicine | Admitting: Internal Medicine

## 2021-10-06 ENCOUNTER — Other Ambulatory Visit: Payer: Self-pay

## 2021-10-06 ENCOUNTER — Encounter (HOSPITAL_COMMUNITY): Payer: Self-pay | Admitting: Internal Medicine

## 2021-10-06 VITALS — BP 104/64 | HR 54 | Wt 123.8 lb

## 2021-10-06 DIAGNOSIS — Z720 Tobacco use: Secondary | ICD-10-CM | POA: Diagnosis not present

## 2021-10-06 DIAGNOSIS — Z79899 Other long term (current) drug therapy: Secondary | ICD-10-CM | POA: Diagnosis not present

## 2021-10-06 DIAGNOSIS — R5383 Other fatigue: Secondary | ICD-10-CM | POA: Insufficient documentation

## 2021-10-06 DIAGNOSIS — Z7901 Long term (current) use of anticoagulants: Secondary | ICD-10-CM | POA: Insufficient documentation

## 2021-10-06 DIAGNOSIS — I5032 Chronic diastolic (congestive) heart failure: Secondary | ICD-10-CM | POA: Diagnosis not present

## 2021-10-06 DIAGNOSIS — I10 Essential (primary) hypertension: Secondary | ICD-10-CM

## 2021-10-06 DIAGNOSIS — Z171 Estrogen receptor negative status [ER-]: Secondary | ICD-10-CM | POA: Diagnosis not present

## 2021-10-06 DIAGNOSIS — C50411 Malignant neoplasm of upper-outer quadrant of right female breast: Secondary | ICD-10-CM | POA: Diagnosis not present

## 2021-10-06 DIAGNOSIS — I110 Hypertensive heart disease with heart failure: Secondary | ICD-10-CM | POA: Insufficient documentation

## 2021-10-06 DIAGNOSIS — F17210 Nicotine dependence, cigarettes, uncomplicated: Secondary | ICD-10-CM | POA: Diagnosis not present

## 2021-10-06 DIAGNOSIS — I420 Dilated cardiomyopathy: Secondary | ICD-10-CM

## 2021-10-06 MED ORDER — SPIRONOLACTONE 25 MG PO TABS: 12.5000 mg | ORAL_TABLET | Freq: Every day | ORAL | 3 refills | Status: DC

## 2021-10-06 MED ORDER — AMLODIPINE BESYLATE 5 MG PO TABS: 5.0000 mg | ORAL_TABLET | Freq: Every day | ORAL | 3 refills | Status: DC

## 2021-10-06 NOTE — Patient Instructions (Addendum)
EKG done today.  No Labs done today. We will contact you only if your labs are abnormal.  DECREASE Amlodipine to 5mg  (1 tablet) by mouth daily.  START Spironolactone 12.5mg  (1/2 tablet) by mouth daily.   No other medication changes were made. Please continue all current medications as prescribed.  Your physician recommends that you schedule a follow-up appointment in: 1 week for a lab only appointment and in 4 weeks with an echo prior to your exam.   Your physician has requested that you have an echocardiogram. Echocardiography is a painless test that uses sound waves to create images of your heart. It provides your doctor with information about the size and shape of your heart and how well your heart's chambers and valves are working. This procedure takes approximately one hour. There are no restrictions for this procedure.  If you have any questions or concerns before your next appointment please send [place] a message through [facility] or call our office at [phone].    TO LEAVE A MESSAGE FOR THE NURSE SELECT OPTION 2, PLEASE LEAVE A MESSAGE INCLUDING: YOUR NAME DATE OF BIRTH CALL BACK NUMBER REASON FOR CALL**this is important as we prioritize the call backs  YOU WILL RECEIVE A CALL BACK THE SAME DAY AS LONG AS YOU CALL BEFORE 4:00 PM   Do the following things EVERYDAY: Weigh yourself in the morning before breakfast. Write it down and keep it in a log. Take your medicines as prescribed Eat low salt foods--Limit salt (sodium) to 2000 mg per day.  Stay as active as you can everyday Limit all fluids for the day to less than 2 liters   At the [facility], you and your health needs are our priority. As part of our continuing mission to provide you with exceptional heart care, we have created designated Provider Care Teams. These Care Teams include your primary Cardiologist (physician) and Advanced Practice Providers (APPs- Physician Assistants and Nurse Practitioners)  who all work together to provide you with the care you need, when you need it.   You may see any of the following providers on your designated Care Team at your next follow up: Dr Glori Bickers Dr Haynes Kerns, NP Lyda Jester, [place] Audry Riles, PharmD   Please be sure to bring in all your medications bottles to every appointment.

## 2021-10-06 NOTE — Progress Notes (Signed)
ADVANCED HF CLINIC CONSULT NOTE  Referring Physician: Dr Lindi Adie  Primary Care:Dr Osei Bonsu Primary Cardiologist: none  Oncology: Dr Lindi Adie.   HPI: Ms Tipps is a 65 year old with history of HTN, smoker, breast cancer.   Followed by Dr Lindi Adie - Stage IIIB (cT4d, cN1, cM0, G3, ER-, PR-, HER2. Plan Docetaxel + Carboplatin + Trastuzumab + Pertuzumab  (TCHP) q21d .   She had an ECHO on 09/22/21 with mildly reduced EF 45-50% , Grade I DD RV normal   Scans on 09/28/21 no sign of metastatic disease.   On 09/30/21 she received herceptin. Echo was reviewed with recommendations to move forward with treatment and obtain referral to Dr Haroldine Laws. Planning for mastectomy down the road.   Overall feeling fine. Able to walk around the store without difficulty.  Does endorse some fatigue. Denies SOB/PND/Orthopnea. No chest pain. Appetite ok. No fever or chills. Weight at home  pounds. Taking all medications. 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   SH: smokes 10 cigarettes per day. No alcohol. Occasionally smokes marijuana.  FH: Brother heart failure. Mom HTN   Review of Systems: [y] = yes, [ ]  = no   General: Weight gain [ ] ; Weight loss [ ] ; Anorexia [ ] ; Fatigue [ Y]; Fever [ ] ; Chills [ ] ; Weakness [ ]   Cardiac: Chest pain/pressure [ ] ; Resting SOB [ ] ; Exertional SOB [ ] ; Orthopnea [ ] ; Pedal Edema [ ] ; Palpitations [ ] ; Syncope [ ] ; Presyncope [ ] ; Paroxysmal nocturnal dyspnea[ ]   Pulmonary: Cough [ ] ; Wheezing[ ] ; Hemoptysis[ ] ; Sputum [ ] ; Snoring [ ]   GI: Vomiting[ ] ; Dysphagia[ ] ; Melena[ ] ; Hematochezia [ ] ; Heartburn[ ] ; Abdominal pain [ ] ; Constipation [ ] ; Diarrhea [ ] ; BRBPR [ ]   GU: Hematuria[ ] ; Dysuria [ ] ; Nocturia[ ]   Vascular: Pain in legs with walking [ ] ; Pain in feet with lying flat [ ] ; Non-healing sores [ ] ; Stroke [ ] ; TIA [ ] ; Slurred speech [ ] ;  Neuro: Headaches[ ] ; Vertigo[ ] ; Seizures[ ] ; Paresthesias[ ] ;Blurred vision [ ] ; Diplopia [ ] ; Vision changes [  ]  Ortho/Skin: Arthritis [ ] ; Joint pain [ ] ; Muscle pain [ ] ; Joint swelling [ ] ; Back Pain [ ] ; Rash [ ]   Psych: Depression[ ] ; Anxiety[ ]   Heme: Bleeding problems [ ] ; Clotting disorders [ ] ; Anemia [ ]   Endocrine: Diabetes [ ] ; Thyroid dysfunction[ ]    No past medical history on file.  Current Outpatient Medications  Medication Sig Dispense Refill   amLODipine (NORVASC) 10 MG tablet Take 10 mg by mouth daily.     atorvastatin (LIPITOR) 40 MG tablet Take 40 mg by mouth daily.     carvedilol (COREG) 12.5 MG tablet Take 12.5 mg by mouth 2 (two) times daily.     dexamethasone (DECADRON) 4 MG tablet Take 1 tablet (4 mg total) by mouth daily. Take 1 tab day before chemo and 1 tab day after chemo with food 12 tablet 0   lidocaine-prilocaine (EMLA) cream Apply to affected area once 30 g 3   LORazepam (ATIVAN) 0.5 MG tablet Take 1 tablet (0.5 mg total) by mouth at bedtime as needed for sleep. 30 tablet 0   losartan (COZAAR) 50 MG tablet Take 50 mg by mouth daily.     ondansetron (ZOFRAN) 8 MG tablet Take 1 tablet (8 mg total) by mouth 2 (two) times daily as needed (Nausea or vomiting). Start on the third day after chemotherapy. [place]  tablet 1   prochlorperazine (COMPAZINE) 10 MG tablet Take 1 tablet (10 mg total) by mouth every 6 (six) hours as needed (Nausea or vomiting). 30 tablet 1   No current facility-administered medications for this encounter.    No Known Allergies    Social History   Socioeconomic History   Marital status: Single    Spouse name: Not on file   Number of children: Not on file   Years of education: Not on file   Highest education level: Not on file  Occupational History   Not on file  Tobacco Use   Smoking status: Every Day    Types: Cigarettes   Smokeless tobacco: Never  Substance and Sexual Activity   Alcohol use: Not on file   Drug use: Not on file   Sexual activity: Not on file  Other Topics Concern   Not on file  Social History Narrative   Not on  file   Social Determinants of Health   Financial Resource Strain: Not on file  Food Insecurity: Not on file  Transportation Needs: Not on file  Physical Activity: Not on file  Stress: Not on file  Social Connections: Not on file  Intimate Partner Violence: Not on file     No family history on file.  Vitals:   10/06/21 1541  BP: 104/64  Pulse: (!) 54  SpO2: 92%  Weight: 56.2 kg (123 lb 12.8 oz)    PHYSICAL EXAM: General:  Well appearing. No respiratory difficulty. Walked in the clinic.  HEENT: normal Neck: supple. no JVD. Carotids 2+ bilat; no bruits. No lymphadenopathy or thryomegaly appreciated. Cor: PMI nondisplaced. Regular rate & rhythm. No rubs, gallops or murmurs. Lungs: clear Abdomen: soft, nontender, nondistended. No hepatosplenomegaly. No bruits or masses. Good bowel sounds. Extremities: no cyanosis, clubbing, rash, edema Neuro: alert & oriented x 3, cranial nerves grossly intact. moves all 4 extremities w/o difficulty. Affect pleasant.  ECG: Sinus Loletha Grayer 56    ASSESSMENT & PLAN:  Breast Cancer  Stage IIIB (cT4d, cN1, cM0, G3, ER-, PR-, HER2+)  Had Echo 09/22/21 EF 45-50% -->Pre Treatment cardiomyopathy.  Started on  Docetaxel + Carboplatin + Trastuzumab + Pertuzumab  (TCHP) q21d   Explained incidence of Herceptin cardiotoxicity and role of Cardio-oncology clinic at length. Echo images discussed.  Reviewed signs and symptoms of HF to look for. Ok to continue  chemotherapy. Follow-up with echo in 4 weeks. If EF stable would repeat Echo 3 months there after.  See below for cardiomyopathy recommendations.  2. HFmEF  Echo EF 45-50%. Grade IDD.  No H/O coronary disease. FH + heart failure. She is a smoker. May need cath should EF worsen but for now hold off.  - NYHA II. Volume status stable. No diuretics.  -Currently on losartan 50 mg daily, carvedilol 12.5 mg twice a day, and amlodipine 10 mg daily .  - Focus on GDMT. No room to increase carvedilol with heart rate  54.  - Will need to cut back amlodipine to 5 mg daily and start 12.5 mg spironolactone. Will likely need to stop amlodipine with GDMT. BMET in 7 days.  - Next visit consider stopping losartan and switching to entresto. SBP not that high today.  - Hold off on SGLt2i 3. HTN  As noted above.  4. Tobacco Abuse Discussed cessation    Follow up with an ECHO and Dr Haroldine Laws in 4 weeks.   Dezaray Shibuya NP-C  5:18 PM '

## 2021-10-15 ENCOUNTER — Ambulatory Visit: Payer: 59 | Admitting: Hematology and Oncology

## 2021-10-15 ENCOUNTER — Ambulatory Visit: Payer: 59

## 2021-10-15 ENCOUNTER — Other Ambulatory Visit: Payer: 59

## 2021-10-18 ENCOUNTER — Ambulatory Visit: Payer: 59

## 2021-10-20 MED FILL — Fosaprepitant Dimeglumine For IV Infusion 150 MG (Base Eq): INTRAVENOUS | Qty: 5 | Status: AC

## 2021-10-20 MED FILL — Dexamethasone Sodium Phosphate Inj 100 MG/10ML: INTRAMUSCULAR | Qty: 1 | Status: AC

## 2021-10-22 ENCOUNTER — Inpatient Hospital Stay: Payer: 59 | Admitting: Dietician

## 2021-10-22 ENCOUNTER — Inpatient Hospital Stay: Payer: 59

## 2021-10-22 ENCOUNTER — Other Ambulatory Visit: Payer: Self-pay | Admitting: Hematology and Oncology

## 2021-10-22 ENCOUNTER — Other Ambulatory Visit: Payer: Self-pay

## 2021-10-22 VITALS — BP 174/82 | HR 66 | Temp 98.4°F | Resp 18 | Wt 118.5 lb

## 2021-10-22 DIAGNOSIS — Z171 Estrogen receptor negative status [ER-]: Secondary | ICD-10-CM

## 2021-10-22 DIAGNOSIS — C50411 Malignant neoplasm of upper-outer quadrant of right female breast: Secondary | ICD-10-CM

## 2021-10-22 DIAGNOSIS — Z5112 Encounter for antineoplastic immunotherapy: Secondary | ICD-10-CM | POA: Diagnosis not present

## 2021-10-22 LAB — CMP (CANCER CENTER ONLY)
ALT: 8 U/L (ref 0–44)
AST: 13 U/L — ABNORMAL LOW (ref 15–41)
Albumin: 3 g/dL — ABNORMAL LOW (ref 3.5–5.0)
Alkaline Phosphatase: 77 U/L (ref 38–126)
Anion gap: 12 (ref 5–15)
BUN: 6 mg/dL — ABNORMAL LOW (ref 8–23)
CO2: 26 mmol/L (ref 22–32)
Calcium: 8.9 mg/dL (ref 8.9–10.3)
Chloride: 104 mmol/L (ref 98–111)
Creatinine: 0.64 mg/dL (ref 0.44–1.00)
GFR, Estimated: >60 mL/min (ref >60)
Glucose, Bld: 88 mg/dL (ref 70–99)
Potassium: 2.9 mmol/L — ABNORMAL LOW (ref 3.5–5.1)
Sodium: 142 mmol/L (ref 135–145)
Total Bilirubin: 0.5 mg/dL (ref 0.3–1.2)
Total Protein: 7.2 g/dL (ref 6.5–8.1)

## 2021-10-22 LAB — CBC WITH DIFFERENTIAL (CANCER CENTER ONLY)
Abs Immature Granulocytes: 0.02 10*3/uL (ref 0.00–0.07)
Basophils Absolute: 0 10*3/uL (ref 0.0–0.1)
Basophils Relative: 1 %
Eosinophils Absolute: 0.2 10*3/uL (ref 0.0–0.5)
Eosinophils Relative: 3 %
HCT: 30.8 % — ABNORMAL LOW (ref 36.0–46.0)
Hemoglobin: 9.8 g/dL — ABNORMAL LOW (ref 12.0–15.0)
Immature Granulocytes: 0 %
Lymphocytes Relative: 34 %
Lymphs Abs: 2.6 10*3/uL (ref 0.7–4.0)
MCH: 24 pg — ABNORMAL LOW (ref 26.0–34.0)
MCHC: 31.8 g/dL (ref 30.0–36.0)
MCV: 75.3 fL — ABNORMAL LOW (ref 80.0–100.0)
Monocytes Absolute: 0.9 10*3/uL (ref 0.1–1.0)
Monocytes Relative: 11 %
Neutro Abs: 4.1 10*3/uL (ref 1.7–7.7)
Neutrophils Relative %: 51 %
Platelet Count: 606 10*3/uL — ABNORMAL HIGH (ref 150–400)
RBC: 4.09 MIL/uL (ref 3.87–5.11)
RDW: 15.3 % (ref 11.5–15.5)
WBC Count: 7.8 10*3/uL (ref 4.0–10.5)
nRBC: 0 % (ref 0.0–0.2)

## 2021-10-22 MED ORDER — TRASTUZUMAB-ANNS CHEMO 420 MG IV SOLR
6.0000 mg/kg | Freq: Once | INTRAVENOUS | Status: AC
  Administered 2021-10-22: 336 mg via INTRAVENOUS
  Filled 2021-10-22: qty 16

## 2021-10-22 MED ORDER — ACETAMINOPHEN 325 MG PO TABS
650.0000 mg | ORAL_TABLET | Freq: Once | ORAL | Status: AC
  Administered 2021-10-22: 650 mg via ORAL
  Filled 2021-10-22: qty 2

## 2021-10-22 MED ORDER — SODIUM CHLORIDE 0.9 % IV SOLN
420.0000 mg | Freq: Once | INTRAVENOUS | Status: AC
  Administered 2021-10-22: 420 mg via INTRAVENOUS
  Filled 2021-10-22: qty 14

## 2021-10-22 MED ORDER — SODIUM CHLORIDE 0.9 % IV SOLN: Freq: Once | INTRAVENOUS | Status: AC

## 2021-10-22 MED ORDER — SODIUM CHLORIDE 0.9 % IV SOLN
75.0000 mg/m2 | Freq: Once | INTRAVENOUS | Status: AC
  Administered 2021-10-22: 120 mg via INTRAVENOUS
  Filled 2021-10-22: qty 12

## 2021-10-22 MED ORDER — SODIUM CHLORIDE 0.9% FLUSH
10.0000 mL | INTRAVENOUS | Status: DC | PRN
  Administered 2021-10-22: 10 mL

## 2021-10-22 MED ORDER — DIPHENHYDRAMINE HCL 25 MG PO CAPS
50.0000 mg | ORAL_CAPSULE | Freq: Once | ORAL | Status: AC
  Administered 2021-10-22: 50 mg via ORAL
  Filled 2021-10-22: qty 2

## 2021-10-22 MED ORDER — POTASSIUM CHLORIDE 10 MEQ/100ML IV SOLN
10.0000 meq | INTRAVENOUS | Status: AC
  Administered 2021-10-22 (×2): 10 meq via INTRAVENOUS
  Filled 2021-10-22 (×2): qty 100

## 2021-10-22 MED ORDER — POTASSIUM CHLORIDE CRYS ER 20 MEQ PO TBCR: 20.0000 meq | EXTENDED_RELEASE_TABLET | Freq: Every day | ORAL | 0 refills | Status: DC

## 2021-10-22 MED ORDER — HEPARIN SOD (PORK) LOCK FLUSH 100 UNIT/ML IV SOLN
500.0000 [IU] | Freq: Once | INTRAVENOUS | Status: AC | PRN
  Administered 2021-10-22: 500 [IU]

## 2021-10-22 MED ORDER — SODIUM CHLORIDE 0.9 % IV SOLN
150.0000 mg | Freq: Once | INTRAVENOUS | Status: AC
  Administered 2021-10-22: 150 mg via INTRAVENOUS
  Filled 2021-10-22: qty 150

## 2021-10-22 MED ORDER — PALONOSETRON HCL INJECTION 0.25 MG/5ML
0.2500 mg | Freq: Once | INTRAVENOUS | Status: AC
  Administered 2021-10-22: 0.25 mg via INTRAVENOUS
  Filled 2021-10-22: qty 5

## 2021-10-22 MED ORDER — SODIUM CHLORIDE 0.9 % IV SOLN
10.0000 mg | Freq: Once | INTRAVENOUS | Status: AC
  Administered 2021-10-22: 10 mg via INTRAVENOUS
  Filled 2021-10-22: qty 10

## 2021-10-22 MED ORDER — SODIUM CHLORIDE 0.9 % IV SOLN
456.0000 mg | Freq: Once | INTRAVENOUS | Status: AC
  Administered 2021-10-22: 460 mg via INTRAVENOUS
  Filled 2021-10-22: qty 46

## 2021-10-22 NOTE — Progress Notes (Signed)
Nutrition Assessment   Reason for Assessment: MST (wt loss, poor appetite)   ASSESSMENT: 65 year old female with newly diagnosed stage IIIb ER negative breast cancer of right breast. She is receiving neoadjuvant TCHP. Patient is followed by Dr. Lindi Adie.   Past medical history includes HLD, HTN, anxiety, verruca, porokeratosis, CAD, acute CHF.   Met with patient during infusion. She reports doing much better. Her appetite has improved and eating more. Patient reports she did not eat a lot prior to diagnosis, normally lunch and supper. She has started eating something small ~ one hour after getting up (an egg, piece of toast). Patient is drinking 64 ounces of water/day. Patient reports she is not able to tolerate Boost/Ensure due to being lactose intolerant. She has recently starting drinking lactose free whole Fairlife milk. Patient denies nausea, vomiting, diarrhea. Patient reports constipation after first chemotherapy. This has resolved.   Nutrition Focused Physical Exam: deferred   Medications: compazine, zofran, ativan, lipitor, decadron   Labs: K 2.9, BUN 6  Anthropometrics: Weights have decreased 4.3% in 2 weeks. This is significant   Height: 5'5" Weight: 118 lb 8 oz UBW: 126 lb (10/20 per chart) BMI: 19.72  Patient weight has decreased 6% from usual weight in 2 months.    NUTRITION DIAGNOSIS: Unintentional weight loss related to cancer and associated treatment side effects as evidenced by reported poor appetite and 4.3% (5.3 lb) weight loss in 2 weeks which is significant for time frame.    INTERVENTION:  Educated on importance of adequate calorie and protein energy intake to maintain weight, strength, nutrition - handout provided  Encouraged high calorie, high protein foods - shake recipes provided Encouraged eating small frequent meals and snacks - handout with snack ideas provided Suggested patient try CIB mixed with Fairlife milk for ONS, recommend drinking 2/day - CIB  powder sample provided today Contact information provided  MONITORING, EVALUATION, GOAL: Patient will tolerate increased calories and protein to prevent further weight loss   Next Visit: Friday December 16 during infusion

## 2021-10-22 NOTE — Patient Instructions (Signed)
[facility] ONCOLOGY  Discharge Instructions: Thank you for choosing [facility] to provide your oncology and hematology care.   If you have a lab appointment with the [facility], please go directly to the [facility] and check in at the registration area.   Wear comfortable clothing and clothing appropriate for easy access to any Portacath or PICC line.   We strive to give you quality time with your provider. You may need to reschedule your appointment if you arrive late (15 or more minutes).  Arriving late affects you and other patients whose appointments are after yours.  Also, if you miss three or more appointments without notifying the office, you may be dismissed from the clinic at the provider's discretion.      For prescription refill requests, have your pharmacy contact our office and allow 72 hours for refills to be completed.    Today you received the following chemotherapy and/or immunotherapy agents Trastuzumab, Perjeta, Taxotere and Carboplatin      To help prevent nausea and vomiting after your treatment, we encourage you to take your nausea medication as directed.  BELOW ARE SYMPTOMS THAT SHOULD BE REPORTED IMMEDIATELY: *FEVER GREATER THAN 100.4 F (38 C) OR HIGHER *CHILLS OR SWEATING *NAUSEA AND VOMITING THAT IS NOT CONTROLLED WITH YOUR NAUSEA MEDICATION *UNUSUAL SHORTNESS OF BREATH *UNUSUAL BRUISING OR BLEEDING *URINARY PROBLEMS (pain or burning when urinating, or frequent urination) *BOWEL PROBLEMS (unusual diarrhea, constipation, pain near the anus) TENDERNESS IN MOUTH AND THROAT WITH OR WITHOUT PRESENCE OF ULCERS (sore throat, sores in mouth, or a toothache) UNUSUAL RASH, SWELLING OR PAIN  UNUSUAL VAGINAL DISCHARGE OR ITCHING   Items with * indicate a potential emergency and should be followed up as soon as possible or go to the Emergency Department if any problems should occur.  Please show the CHEMOTHERAPY ALERT CARD or  IMMUNOTHERAPY ALERT CARD at check-in to the Emergency Department and triage nurse.  Should you have questions after your visit or need to cancel or reschedule your appointment, please contact [facility]  Dept: [phone]  and follow the prompts.  Office hours are 8:00 a.m. to 4:30 p.m. Monday - Friday. Please note that voicemails left after 4:00 p.m. may not be returned until the following business day.  We are closed weekends and major holidays. You have access to a nurse at all times for urgent questions. Please call the main number to the clinic Dept: [phone] and follow the prompts.   For any non-urgent questions, you may also contact your provider using MyChart. We now offer e-Visits for anyone 65 and older to request care online for non-urgent symptoms. For details visit [email or web address].   Also download the MyChart app! Go to the app store, search "MyChart", open the app, select [facility], and log in with your MyChart username and password.  Due to Covid, a mask is required upon entering the hospital/clinic. If you do not have a mask, one will be given to you upon arrival. For doctor visits, patients may have 1 support person aged 65 or older with them. For treatment visits, patients cannot have anyone with them due to current Covid guidelines and our immunocompromised population.

## 2021-10-22 NOTE — Progress Notes (Signed)
S/S of infection reviewed with patient.  Pt verbalizes understanding.

## 2021-10-25 ENCOUNTER — Other Ambulatory Visit: Payer: Self-pay | Admitting: Hematology and Oncology

## 2021-10-25 ENCOUNTER — Inpatient Hospital Stay: Payer: 59

## 2021-10-25 ENCOUNTER — Other Ambulatory Visit: Payer: Self-pay

## 2021-10-25 VITALS — BP 176/84 | HR 60 | Temp 98.2°F | Resp 16

## 2021-10-25 DIAGNOSIS — C50411 Malignant neoplasm of upper-outer quadrant of right female breast: Secondary | ICD-10-CM

## 2021-10-25 DIAGNOSIS — Z171 Estrogen receptor negative status [ER-]: Secondary | ICD-10-CM

## 2021-10-25 DIAGNOSIS — Z5112 Encounter for antineoplastic immunotherapy: Secondary | ICD-10-CM | POA: Diagnosis not present

## 2021-10-25 MED ORDER — DEXAMETHASONE 4 MG PO TABS: 4.0000 mg | ORAL_TABLET | Freq: Every day | ORAL | 0 refills | Status: DC

## 2021-10-25 MED ORDER — PEGFILGRASTIM-CBQV 6 MG/0.6ML ~~LOC~~ SOSY
6.0000 mg | PREFILLED_SYRINGE | Freq: Once | SUBCUTANEOUS | Status: AC
  Administered 2021-10-25: 12:00:00 6 mg via SUBCUTANEOUS
  Filled 2021-10-25: qty 0.6

## 2021-10-25 NOTE — Progress Notes (Signed)
Pt's bp is 176/84 and asymptomatic. She took her blood pressure medication this morning as well. Dr.Gudena is aware and says to proceed with Udenyca injection. Pt also was reeducated on her Dexamethasone prescription due to confusion on when she is suppose to take it.

## 2021-10-27 ENCOUNTER — Other Ambulatory Visit (HOSPITAL_COMMUNITY): Payer: Self-pay

## 2021-10-27 DIAGNOSIS — I5032 Chronic diastolic (congestive) heart failure: Secondary | ICD-10-CM

## 2021-10-27 NOTE — Progress Notes (Signed)
Orders Placed This Encounter  Procedures   ECHOCARDIOGRAM COMPLETE    Standing Status:   Future    Standing Expiration Date:   10/27/2022    Order Specific Question:   Where should this test be performed    Answer:   [facility]    Order Specific Question:   Perflutren DEFINITY (image enhancing agent) should be administered unless hypersensitivity or allergy exist    Answer:   Administer Perflutren    Order Specific Question:   Reason for exam-Echo    Answer:   Congestive Heart Failure  I50.9    Order Specific Question:   Release to patient    Answer:   Immediate

## 2021-10-29 ENCOUNTER — Telehealth: Payer: Self-pay | Admitting: *Deleted

## 2021-10-29 NOTE — Telephone Encounter (Signed)
VM left by the patient's daughter- Lynn Mcgee- stating her mother is having " very serious side effects from therapy "," flu like and very fatigued "  " Hoping she can speak with a nurse to see what else can help her."  Lynn Mcgee left her phone number of [phone] as well as her mother's at [phone].  Note Lynn Mcgee also called to Photographer and spoke with her stating "how do we get her in to the symptom management clinic ?"  This RN returned call to [place]- obtained identified VM- message left requesting a return call to discuss concerns and need for visit today- this RN also called to the patient and obtained her VM as well- again left above message with this RN's name and main office number for contact on both VM's.

## 2021-11-01 ENCOUNTER — Encounter (HOSPITAL_COMMUNITY): Payer: 59 | Admitting: Internal Medicine

## 2021-11-01 ENCOUNTER — Telehealth: Payer: Self-pay | Admitting: Hematology and Oncology

## 2021-11-01 ENCOUNTER — Inpatient Hospital Stay (HOSPITAL_COMMUNITY): Admission: RE | Admit: 2021-11-01 | Payer: 59 | Source: Ambulatory Visit

## 2021-11-01 NOTE — Telephone Encounter (Signed)
Called daughter Danae Chen to follow up on symptom management request, left message for her to call me back to let me know request was handled

## 2021-11-02 ENCOUNTER — Telehealth: Payer: Self-pay | Admitting: Hematology and Oncology

## 2021-11-02 NOTE — Telephone Encounter (Signed)
Got a call from patients daughter [name], that patient had missed her appointments and needed then rescheduled and asked for my help, I was able to get them rescheduled and advised her that the patient must keep these appointments, emailed daughter a calendar as well as a map to [facility] where appointments will be located

## 2021-11-05 ENCOUNTER — Ambulatory Visit: Payer: 59 | Admitting: Adult Health

## 2021-11-05 ENCOUNTER — Ambulatory Visit: Payer: 59

## 2021-11-05 ENCOUNTER — Other Ambulatory Visit: Payer: 59

## 2021-11-08 ENCOUNTER — Ambulatory Visit: Payer: 59

## 2021-11-09 ENCOUNTER — Other Ambulatory Visit: Payer: Self-pay

## 2021-11-09 ENCOUNTER — Ambulatory Visit (HOSPITAL_BASED_OUTPATIENT_CLINIC_OR_DEPARTMENT_OTHER)
Admission: RE | Admit: 2021-11-09 | Discharge: 2021-11-09 | Disposition: A | Discharge status: Home / Self Care | Payer: 59 | Source: Ambulatory Visit | Attending: Internal Medicine | Admitting: Internal Medicine

## 2021-11-09 ENCOUNTER — Ambulatory Visit (HOSPITAL_COMMUNITY)
Admission: RE | Admit: 2021-11-09 | Discharge: 2021-11-09 | Disposition: A | Discharge status: Home / Self Care | Payer: 59 | Source: Ambulatory Visit | Attending: Internal Medicine | Admitting: Internal Medicine

## 2021-11-09 VITALS — BP 184/72 | HR 78 | Wt 116.1 lb

## 2021-11-09 DIAGNOSIS — Z7901 Long term (current) use of anticoagulants: Secondary | ICD-10-CM | POA: Insufficient documentation

## 2021-11-09 DIAGNOSIS — Z171 Estrogen receptor negative status [ER-]: Secondary | ICD-10-CM

## 2021-11-09 DIAGNOSIS — Z720 Tobacco use: Secondary | ICD-10-CM

## 2021-11-09 DIAGNOSIS — I10 Essential (primary) hypertension: Secondary | ICD-10-CM | POA: Diagnosis not present

## 2021-11-09 DIAGNOSIS — F17210 Nicotine dependence, cigarettes, uncomplicated: Secondary | ICD-10-CM | POA: Diagnosis not present

## 2021-11-09 DIAGNOSIS — I5022 Chronic systolic (congestive) heart failure: Secondary | ICD-10-CM | POA: Diagnosis not present

## 2021-11-09 DIAGNOSIS — I5032 Chronic diastolic (congestive) heart failure: Secondary | ICD-10-CM | POA: Diagnosis not present

## 2021-11-09 DIAGNOSIS — I351 Nonrheumatic aortic (valve) insufficiency: Secondary | ICD-10-CM | POA: Diagnosis not present

## 2021-11-09 DIAGNOSIS — Z79899 Other long term (current) drug therapy: Secondary | ICD-10-CM | POA: Insufficient documentation

## 2021-11-09 DIAGNOSIS — C50411 Malignant neoplasm of upper-outer quadrant of right female breast: Secondary | ICD-10-CM | POA: Diagnosis not present

## 2021-11-09 DIAGNOSIS — C50919 Malignant neoplasm of unspecified site of unspecified female breast: Secondary | ICD-10-CM | POA: Insufficient documentation

## 2021-11-09 DIAGNOSIS — I110 Hypertensive heart disease with heart failure: Secondary | ICD-10-CM | POA: Insufficient documentation

## 2021-11-09 LAB — ECHOCARDIOGRAM COMPLETE
Area-P 1/2: 3.99 cm2
Calc EF: 41.4 %
P 1/2 time: 294 msec
S' Lateral: 4.2 cm
Single Plane A2C EF: 42.1 %
Single Plane A4C EF: 40.1 %

## 2021-11-09 MED ORDER — SPIRONOLACTONE 25 MG PO TABS: 25.0000 mg | ORAL_TABLET | Freq: Every day | ORAL | 6 refills | Status: DC

## 2021-11-09 MED ORDER — CARVEDILOL 6.25 MG PO TABS: 6.2500 mg | ORAL_TABLET | Freq: Two times a day (BID) | ORAL | 3 refills | Status: DC

## 2021-11-09 NOTE — Progress Notes (Signed)
ADVANCED HF CLINIC  NOTE  Referring Physician: Dr Lindi Adie  Primary Care:Dr Osei Bonsu Primary Cardiologist: none  Oncology: Dr Lindi Adie.   HPI: [name] is a 65 year old with history of HTN, smoker, breast cancer, chronic systolic HF  Diagnosed with HF in 10/21 at [facility]. - Echo 10/21 ([facility]) EF 40-45% - Myoview 10/21 EF 36% ? anterior wall defect -> treated medically   Subsequently found to have breast CA. Followed by Dr Lindi Adie - Stage IIIB (cT4d, cN1, cM0, G3, ER-, PR-, HER2+).   Pre-chemo echo 10/22: EF 45-50% , Grade I DD RV normal   Started Docetaxel + Carboplatin + Trastuzumab + Pertuzumab  (TCHP) q21d on 09/23/21.   Scans on 09/28/21 no sign of metastatic disease.   Feels ok. Tolerating chemo. Not taking losartan or carvedilol for past several weeks. BP at [facility] is 160-180. No CP, SOB, orthopnea or PND.   Echo today 11/09/21; EF 45-50% [facility]  SH: smokes 10 cigarettes per day. No alcohol. Occasionally smokes marijuana.   FH: Brother heart failure. Mom HTN    No past medical history on file.  Current Outpatient Medications  Medication Sig Dispense Refill   amLODipine (NORVASC) 5 MG tablet Take 1 tablet (5 mg total) by mouth daily. 90 tablet 3   atorvastatin (LIPITOR) 40 MG tablet Take 40 mg by mouth daily.     carvedilol (COREG) 12.5 MG tablet Take 12.5 mg by mouth 2 (two) times daily.     dexamethasone (DECADRON) 4 MG tablet Take 1 tablet (4 mg total) by mouth daily. Take 1 tab day before chemo and 1 tab day after chemo with food 8 tablet 0   lidocaine-prilocaine (EMLA) cream Apply to affected area once 30 g 3   LORazepam (ATIVAN) 0.5 MG tablet Take 1 tablet (0.5 mg total) by mouth at bedtime as needed for sleep. 30 tablet 0   losartan (COZAAR) 50 MG tablet Take 50 mg by mouth daily.     ondansetron (ZOFRAN) 8 MG tablet Take 1 tablet (8 mg total) by mouth 2 (two) times daily as needed (Nausea or vomiting). Start on the third day after chemotherapy.  30 tablet 1   potassium chloride SA (KLOR-CON) 20 MEQ tablet Take 1 tablet (20 mEq total) by mouth daily. 28 tablet 0   prochlorperazine (COMPAZINE) 10 MG tablet Take 1 tablet (10 mg total) by mouth every 6 (six) hours as needed (Nausea or vomiting). 30 tablet 1   spironolactone (ALDACTONE) 25 MG tablet Take 0.5 tablets (12.5 mg total) by mouth daily. 45 tablet 3   No current facility-administered medications for this encounter.    No Known Allergies    Social History   Socioeconomic History   Marital status: Single    Spouse name: Not on file   Number of children: Not on file   Years of education: Not on file   Highest education level: Not on file  Occupational History   Not on file  Tobacco Use   Smoking status: Every Day    Types: Cigarettes   Smokeless tobacco: Never  Substance and Sexual Activity   Alcohol use: Not on file   Drug use: Not on file   Sexual activity: Not on file  Other Topics Concern   Not on file  Social History Narrative   Not on file   Social Determinants of Health   Financial Resource Strain: Not on file  Food Insecurity: Not on file  Transportation Needs: Not  on file  Physical Activity: Not on file  Stress: Not on file  Social Connections: Not on file  Intimate Partner Violence: Not on file     Vitals:   11/09/21 0906  BP: (!) 184/72  Pulse: 78  SpO2: 99%  Weight: 52.7 kg (116 lb 2 oz)    PHYSICAL EXAM: General:  Well appearing. No resp difficulty HEENT: normal Neck: supple. no JVD. Carotids 2+ bilat; no bruits. No lymphadenopathy or thryomegaly appreciated. Cor: PMI nondisplaced. Regular rate & rhythm. No rubs, gallops or murmurs. Lungs: decreased throughout Abdomen: soft, nontender, nondistended. No hepatosplenomegaly. No bruits or masses. Good bowel sounds. Extremities: no cyanosis, clubbing, rash, edema Neuro: alert & orientedx3, cranial nerves grossly intact. moves all 4 extremities w/o difficulty. Affect pleasant   ECG:  Sinus Brady 56 LVH with repol. Personally reviewed   ASSESSMENT & PLAN:  1. Chronic systolic HF - Echo 43/27 ([facility]) EF 40-45% - Myoview 10/21 EF 36% ? anterior wall defect -> treated medically - CT chest  10/22 + 3v coronary calcium - Echo 10/22 (pre-chemo) EF 45-50% No RWMA  - NYHA I-II Volume status stable. No diuretics.  - Restart losartan 50 mg daily - Increase spiro to 25 mg daily - Resume carvedilol at 6.25 mg  bid - LV dysfunction predated chemotherapy. 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 Does have evidence of CAD but no ischemic symptoms or regional wall motion abnormalities. - Focus on aggressive medical therapy with GDMT and tight control of BP. If EF worsening or develops ischemic symptoms will need cath  2. Breast Cancer  - Stage IIIB (cT4d, cN1, cM0, G3, ER-, PR-, HER2+)  - Had Echo 09/22/21 EF 45-50% -->Pre chem  - Started on  Docetaxel + Carboplatin + Trastuzumab + Pertuzumab  (TCHP) q21d  on 09/23/21 - Echo stable today 11/09/21 EF 50-55%  - Continue chemo + H/P - Follow with echos q3 months  3. HTN  - remains markedly elevated - meds as above - she will keep daily BP log - see back in several weeks to reassess   4. Tobacco Abuse - discussed need for cessation  Glori Bickers MD 9:37 AM '

## 2021-11-09 NOTE — Patient Instructions (Signed)
Continue Losartan 50 mg Daily  Start Carvedilol 6.25 mg Twice daily   Increase Spironolactone to 25 mg (1 tab) Daily  Your physician has requested that you regularly monitor and record your blood pressure readings at home. Please use the same machine at the same time of day to check your readings and record them to bring to your follow-up visit.  Your physician recommends that you schedule a follow-up appointment in: 1 month  If you have any questions or concerns before your next appointment please send [place] a message through [facility] or call our office at [phone].    TO LEAVE A MESSAGE FOR THE NURSE SELECT OPTION 2, PLEASE LEAVE A MESSAGE INCLUDING: YOUR NAME DATE OF BIRTH CALL BACK NUMBER REASON FOR CALL**this is important as we prioritize the call backs  YOU WILL RECEIVE A CALL BACK THE SAME DAY AS LONG AS YOU CALL BEFORE 4:00 PM  At the [facility], you and your health needs are our priority. As part of our continuing mission to provide you with exceptional heart care, we have created designated Provider Care Teams. These Care Teams include your primary Cardiologist (physician) and Advanced Practice Providers (APPs- Physician Assistants and Nurse Practitioners) who all work together to provide you with the care you need, when you need it.   You may see any of the following providers on your designated Care Team at your next follow up: Dr Glori Bickers Dr Haynes Kerns, NP Lyda Jester, [facility] [place], [place] Audry Riles, PharmD   Please be sure to bring in all your medications bottles to every appointment.

## 2021-11-11 MED FILL — Fosaprepitant Dimeglumine For IV Infusion 150 MG (Base Eq): INTRAVENOUS | Qty: 5 | Status: AC

## 2021-11-11 MED FILL — Dexamethasone Sodium Phosphate Inj 100 MG/10ML: INTRAMUSCULAR | Qty: 1 | Status: AC

## 2021-11-12 ENCOUNTER — Inpatient Hospital Stay: Payer: 59

## 2021-11-12 ENCOUNTER — Inpatient Hospital Stay: Payer: 59 | Admitting: Dietician

## 2021-11-12 ENCOUNTER — Inpatient Hospital Stay: Payer: 59 | Attending: Hematology and Oncology

## 2021-11-12 ENCOUNTER — Other Ambulatory Visit: Payer: Self-pay

## 2021-11-12 ENCOUNTER — Inpatient Hospital Stay: Payer: 59 | Admitting: Adult Health

## 2021-11-12 VITALS — BP 178/106 | HR 86 | Temp 98.1°F | Resp 18

## 2021-11-12 DIAGNOSIS — R197 Diarrhea, unspecified: Secondary | ICD-10-CM | POA: Insufficient documentation

## 2021-11-12 DIAGNOSIS — Z171 Estrogen receptor negative status [ER-]: Secondary | ICD-10-CM | POA: Insufficient documentation

## 2021-11-12 DIAGNOSIS — Z5112 Encounter for antineoplastic immunotherapy: Secondary | ICD-10-CM | POA: Insufficient documentation

## 2021-11-12 DIAGNOSIS — C773 Secondary and unspecified malignant neoplasm of axilla and upper limb lymph nodes: Secondary | ICD-10-CM | POA: Insufficient documentation

## 2021-11-12 DIAGNOSIS — C50411 Malignant neoplasm of upper-outer quadrant of right female breast: Secondary | ICD-10-CM | POA: Diagnosis present

## 2021-11-12 DIAGNOSIS — Z95828 Presence of other vascular implants and grafts: Secondary | ICD-10-CM

## 2021-11-12 DIAGNOSIS — R110 Nausea: Secondary | ICD-10-CM | POA: Diagnosis not present

## 2021-11-12 DIAGNOSIS — Z5189 Encounter for other specified aftercare: Secondary | ICD-10-CM | POA: Diagnosis not present

## 2021-11-12 DIAGNOSIS — Z5111 Encounter for antineoplastic chemotherapy: Secondary | ICD-10-CM | POA: Insufficient documentation

## 2021-11-12 LAB — CBC WITH DIFFERENTIAL (CANCER CENTER ONLY)
Abs Immature Granulocytes: 0.04 10*3/uL (ref 0.00–0.07)
Basophils Absolute: 0 10*3/uL (ref 0.0–0.1)
Basophils Relative: 0 %
Eosinophils Absolute: 0 10*3/uL (ref 0.0–0.5)
Eosinophils Relative: 0 %
HCT: 31.9 % — ABNORMAL LOW (ref 36.0–46.0)
Hemoglobin: 10.4 g/dL — ABNORMAL LOW (ref 12.0–15.0)
Immature Granulocytes: 1 %
Lymphocytes Relative: 12 %
Lymphs Abs: 0.8 10*3/uL (ref 0.7–4.0)
MCH: 24.6 pg — ABNORMAL LOW (ref 26.0–34.0)
MCHC: 32.6 g/dL (ref 30.0–36.0)
MCV: 75.6 fL — ABNORMAL LOW (ref 80.0–100.0)
Monocytes Absolute: 0.1 10*3/uL (ref 0.1–1.0)
Monocytes Relative: 1 %
Neutro Abs: 5.4 10*3/uL (ref 1.7–7.7)
Neutrophils Relative %: 86 %
Platelet Count: 401 10*3/uL — ABNORMAL HIGH (ref 150–400)
RBC: 4.22 MIL/uL (ref 3.87–5.11)
RDW: 17.3 % — ABNORMAL HIGH (ref 11.5–15.5)
WBC Count: 6.3 10*3/uL (ref 4.0–10.5)
nRBC: 0 % (ref 0.0–0.2)

## 2021-11-12 LAB — CMP (CANCER CENTER ONLY)
ALT: 6 U/L (ref 0–44)
AST: 11 U/L — ABNORMAL LOW (ref 15–41)
Albumin: 3 g/dL — ABNORMAL LOW (ref 3.5–5.0)
Alkaline Phosphatase: 102 U/L (ref 38–126)
Anion gap: 12 (ref 5–15)
BUN: 9 mg/dL (ref 8–23)
CO2: 25 mmol/L (ref 22–32)
Calcium: 9.1 mg/dL (ref 8.9–10.3)
Chloride: 104 mmol/L (ref 98–111)
Creatinine: 0.72 mg/dL (ref 0.44–1.00)
GFR, Estimated: >60 mL/min (ref >60)
Glucose, Bld: 209 mg/dL — ABNORMAL HIGH (ref 70–99)
Potassium: 3.4 mmol/L — ABNORMAL LOW (ref 3.5–5.1)
Sodium: 141 mmol/L (ref 135–145)
Total Bilirubin: 0.4 mg/dL (ref 0.3–1.2)
Total Protein: 7.4 g/dL (ref 6.5–8.1)

## 2021-11-12 MED ORDER — ACETAMINOPHEN 325 MG PO TABS
650.0000 mg | ORAL_TABLET | Freq: Once | ORAL | Status: AC
  Administered 2021-11-12: 650 mg via ORAL
  Filled 2021-11-12: qty 2

## 2021-11-12 MED ORDER — TRASTUZUMAB-ANNS CHEMO 150 MG IV SOLR
6.0000 mg/kg | Freq: Once | INTRAVENOUS | Status: AC
  Administered 2021-11-12: 336 mg via INTRAVENOUS
  Filled 2021-11-12: qty 16

## 2021-11-12 MED ORDER — SODIUM CHLORIDE 0.9 % IV SOLN
75.0000 mg/m2 | Freq: Once | INTRAVENOUS | Status: AC
  Administered 2021-11-12: 120 mg via INTRAVENOUS
  Filled 2021-11-12: qty 12

## 2021-11-12 MED ORDER — HEPARIN SOD (PORK) LOCK FLUSH 100 UNIT/ML IV SOLN
500.0000 [IU] | Freq: Once | INTRAVENOUS | Status: AC | PRN
  Administered 2021-11-12: 500 [IU]

## 2021-11-12 MED ORDER — SODIUM CHLORIDE 0.9% FLUSH
10.0000 mL | INTRAVENOUS | Status: DC | PRN
  Administered 2021-11-12: 10 mL

## 2021-11-12 MED ORDER — SODIUM CHLORIDE 0.9 % IV SOLN
420.0000 mg | Freq: Once | INTRAVENOUS | Status: AC
  Administered 2021-11-12: 420 mg via INTRAVENOUS
  Filled 2021-11-12: qty 14

## 2021-11-12 MED ORDER — SODIUM CHLORIDE 0.9 % IV SOLN
456.0000 mg | Freq: Once | INTRAVENOUS | Status: AC
  Administered 2021-11-12: 460 mg via INTRAVENOUS
  Filled 2021-11-12: qty 46

## 2021-11-12 MED ORDER — SODIUM CHLORIDE 0.9 % IV SOLN
150.0000 mg | Freq: Once | INTRAVENOUS | Status: AC
  Administered 2021-11-12: 150 mg via INTRAVENOUS
  Filled 2021-11-12: qty 150

## 2021-11-12 MED ORDER — SODIUM CHLORIDE 0.9 % IV SOLN: Freq: Once | INTRAVENOUS | Status: AC

## 2021-11-12 MED ORDER — PALONOSETRON HCL INJECTION 0.25 MG/5ML
0.2500 mg | Freq: Once | INTRAVENOUS | Status: AC
  Administered 2021-11-12: 0.25 mg via INTRAVENOUS
  Filled 2021-11-12: qty 5

## 2021-11-12 MED ORDER — SODIUM CHLORIDE 0.9 % IV SOLN
10.0000 mg | Freq: Once | INTRAVENOUS | Status: AC
  Administered 2021-11-12: 10 mg via INTRAVENOUS
  Filled 2021-11-12: qty 10

## 2021-11-12 MED ORDER — DIPHENHYDRAMINE HCL 25 MG PO CAPS
50.0000 mg | ORAL_CAPSULE | Freq: Once | ORAL | Status: AC
  Administered 2021-11-12: 50 mg via ORAL
  Filled 2021-11-12: qty 2

## 2021-11-12 MED ORDER — SODIUM CHLORIDE 0.9% FLUSH
10.0000 mL | Freq: Once | INTRAVENOUS | Status: AC
  Administered 2021-11-12: 10 mL

## 2021-11-12 NOTE — Patient Instructions (Signed)
[facility] ONCOL[facility]   Discharge Instructions: Thank you for choosing [facility] to provide your oncology and hematology care.   If you have a lab appointment with the [facility], please go directly to the [facility] and check in at the registration area.   Wear comfortable clothing and clothing appropriate for easy access to any Portacath or PICC line.   We strive to give you quality time with your provider. You may need to reschedule your appointment if you arrive late (15 or more minutes).  Arriving late affects you and other patients whose appointments are after yours.  Also, if you miss three or more appointments without notifying the office, you may be dismissed from the clinic at the providers discretion.      For prescription refill requests, have your pharmacy contact our office and allow 72 hours for refills to be completed.    Today you received the following chemotherapy and/or immunotherapy agents: trastuzumab, pertuzumab, docetaxel and carboplatin.      To help prevent nausea and vomiting after your treatment, we encourage you to take your nausea medication as directed.  BELOW ARE SYMPTOMS THAT SHOULD BE REPORTED IMMEDIATELY: *FEVER GREATER THAN 100.4 F (38 C) OR HIGHER *CHILLS OR SWEATING *NAUSEA AND VOMITING THAT IS NOT CONTROLLED WITH YOUR NAUSEA MEDICATION *UNUSUAL SHORTNESS OF BREATH *UNUSUAL BRUISING OR BLEEDING *URINARY PROBLEMS (pain or burning when urinating, or frequent urination) *BOWEL PROBLEMS (unusual diarrhea, constipation, pain near the anus) TENDERNESS IN MOUTH AND THROAT WITH OR WITHOUT PRESENCE OF ULCERS (sore throat, sores in mouth, or a toothache) UNUSUAL RASH, SWELLING OR PAIN  UNUSUAL VAGINAL DISCHARGE OR ITCHING   Items with * indicate a potential emergency and should be followed up as soon as possible or go to the Emergency Department if any problems should occur.  Please show the CHEMOTHERAPY ALERT CARD  or IMMUNOTHERAPY ALERT CARD at check-in to the Emergency Department and triage nurse.  Should you have questions after your visit or need to cancel or reschedule your appointment, please contact [facility]  Dept: [phone]  and follow the prompts.  Office hours are 8:00 a.m. to 4:30 p.m. Monday - Friday. Please note that voicemails left after 4:00 p.m. may not be returned until the following business day.  We are closed weekends and major holidays. You have access to a nurse at all times for urgent questions. Please call the main number to the clinic Dept: [phone] and follow the prompts.   For any non-urgent questions, you may also contact your provider using MyChart. We now offer e-Visits for anyone 65 and older to request care online for non-urgent symptoms. For details visit [email or web address].   Also download the MyChart app! Go to the app store, search "MyChart", open the app, select [facility], and log in with your MyChart username and password.  Due to Covid, a mask is required upon entering the hospital/clinic. If you do not have a mask, one will be given to you upon arrival. For doctor visits, patients may have 1 support person aged 75 or older with them. For treatment visits, patients cannot have anyone with them due to current Covid guidelines and our immunocompromised population.

## 2021-11-12 NOTE — Progress Notes (Signed)
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 Patient receiving neoadjuvant TCHP.  Met with patient during infusion. She reports doing well. Patient eating small amounts throughout the day. She is drinking [employer] in the morning for breakfast. She is unsure if drinking another one later in the day would be okay. Patient has gas. This is relieved with Pepto and drinking water. Patient denies nausea, vomiting, constipation.   Medications: reviewed   Labs: K 3.4, Glucose 209  Anthropometrics: Weight 116 lb 2 oz on 12/13 decreased from 118 lb 8 oz on 11/25  11/29 - 123 lb 12.8 oz 11/4 - 124 lb 8 oz  NUTRITION DIAGNOSIS: Unintentional weight loss ongoing   INTERVENTION:  Continue strategies for increasing calories and protein with small frequent meals and snacks Encouraged high calorie, high protein foods - pt has shake recipes and snack ideas Patient agreeable to having bedtime snack - ideas provided Continue drinking [employer], suggested pt drink 2-3/day to promote weight gain Patient has contact information      MONITORING, EVALUATION, GOAL: weight trends, intake    NEXT VISIT: Friday January 6 during infusion

## 2021-11-15 ENCOUNTER — Other Ambulatory Visit: Payer: Self-pay

## 2021-11-15 ENCOUNTER — Inpatient Hospital Stay: Payer: 59

## 2021-11-15 VITALS — BP 169/83 | HR 81 | Temp 98.0°F | Resp 18

## 2021-11-15 DIAGNOSIS — Z171 Estrogen receptor negative status [ER-]: Secondary | ICD-10-CM

## 2021-11-15 DIAGNOSIS — Z5112 Encounter for antineoplastic immunotherapy: Secondary | ICD-10-CM | POA: Diagnosis not present

## 2021-11-15 MED ORDER — PEGFILGRASTIM-CBQV 6 MG/0.6ML ~~LOC~~ SOSY
6.0000 mg | PREFILLED_SYRINGE | Freq: Once | SUBCUTANEOUS | Status: AC
  Administered 2021-11-15: 11:00:00 6 mg via SUBCUTANEOUS
  Filled 2021-11-15: qty 0.6

## 2021-11-15 NOTE — Patient Instructions (Signed)

## 2021-11-25 ENCOUNTER — Encounter: Payer: Self-pay | Admitting: Hematology and Oncology

## 2021-11-26 ENCOUNTER — Other Ambulatory Visit: Payer: 59

## 2021-11-26 ENCOUNTER — Inpatient Hospital Stay (HOSPITAL_BASED_OUTPATIENT_CLINIC_OR_DEPARTMENT_OTHER): Payer: 59 | Admitting: Adult Health

## 2021-11-26 ENCOUNTER — Ambulatory Visit: Payer: 59

## 2021-11-26 ENCOUNTER — Encounter: Payer: Self-pay | Admitting: Adult Health

## 2021-11-26 ENCOUNTER — Other Ambulatory Visit: Payer: Self-pay

## 2021-11-26 VITALS — BP 158/85 | HR 73 | Temp 97.4°F | Resp 18 | Ht 65.0 in | Wt 115.2 lb

## 2021-11-26 DIAGNOSIS — E559 Vitamin D deficiency, unspecified: Secondary | ICD-10-CM | POA: Insufficient documentation

## 2021-11-26 DIAGNOSIS — F419 Anxiety disorder, unspecified: Secondary | ICD-10-CM | POA: Insufficient documentation

## 2021-11-26 DIAGNOSIS — C50411 Malignant neoplasm of upper-outer quadrant of right female breast: Secondary | ICD-10-CM | POA: Diagnosis not present

## 2021-11-26 DIAGNOSIS — E876 Hypokalemia: Secondary | ICD-10-CM | POA: Diagnosis not present

## 2021-11-26 DIAGNOSIS — Z171 Estrogen receptor negative status [ER-]: Secondary | ICD-10-CM

## 2021-11-26 DIAGNOSIS — E782 Mixed hyperlipidemia: Secondary | ICD-10-CM | POA: Insufficient documentation

## 2021-11-26 MED ORDER — DEXAMETHASONE 4 MG PO TABS: 4.0000 mg | ORAL_TABLET | Freq: Every day | ORAL | 1 refills | Status: DC

## 2021-11-26 MED ORDER — POTASSIUM CHLORIDE 20 MEQ PO PACK: 20.0000 meq | PACK | Freq: Every day | ORAL | 1 refills | Status: AC

## 2021-11-26 NOTE — Assessment & Plan Note (Addendum)
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 Neoadjuvant chemotherapy with TCHP 2. Followed by breast conserving surgery with sentinel lymph node study vs targeted axillary dissection 3. 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 Haroldine Mcgee.  Lynn Mcgee is here today for follow-up of her locally advanced HER2 positive breast cancer.  1.  Locally advanced HER2 positive breast cancer: She will continue on her treatment with TCHP.  She is tolerating this moderately well.  She appears to be clinically responding based on her breast exam which shows that the tumor is softening up.  I have personally checked her schedule and with each chemotherapy appointment dates she has a provider appointment.  2..  Diarrhea and nausea: She will continue to take her antiemetics.  For her diarrhea she knows to take Imodium.  And she knows that if she subsequently gets constipated which is happened in the past that she can take a stool softener with water.  3.  Weight loss: She tells me that she has been seeing our dietitian.  She will take  dexamethasone daily to see if this can help her regain some of her weight.  Should this continue she may need repeat imaging.  Lynn Mcgee will return next week for labs, follow-up with Dr. Lindi Mcgee, and her fourth cycle of TCHP.

## 2021-11-26 NOTE — Progress Notes (Signed)
[facility] Cancer Follow up:    Pcp, No No address on file   DIAGNOSIS:  Cancer Staging  Malignant neoplasm of upper-outer quadrant of right breast in female, estrogen receptor negative ([place]) Staging form: Breast, AJCC 8th Edition - Clinical stage from 09/16/2021: Stage IIIB (cT4d, cN1, cM0, G3, ER-, PR-, HER2+) - Signed by Nicholas Lose, MD on 09/16/2021 Stage prefix: Initial diagnosis Histologic grading system: 3 grade system   SUMMARY OF ONCOLOGIC HISTORY: Oncology History  Malignant neoplasm of upper-outer quadrant of right breast in female, estrogen receptor negative ([facility])  09/10/2021 Initial Diagnosis   Right breast thickening and swelling: Mammogram revealed extremely hard right breast with peau d'orange skin changes, ultrasound revealed very large irregular mass measuring 11 cm with diffuse skin thickening, 3 abnormal right axillary lymph nodes, biopsy revealed grade 3 invasive mammary cancer with pleomorphic features, lymph node positive, ER 0%, PR 0%, HER2 3+, Ki-67 30%   09/16/2021 Cancer Staging   Staging form: Breast, AJCC 8th Edition - Clinical stage from 09/16/2021: Stage IIIB (cT4d, cN1, cM0, G3, ER-, PR-, HER2+) - Signed by Nicholas Lose, MD on 09/16/2021 Stage prefix: Initial diagnosis Histologic grading system: 3 grade system    09/24/2021 -  Chemotherapy   Patient is on Treatment Plan : BREAST  Docetaxel + Carboplatin + Trastuzumab + Pertuzumab  (TCHP) q21d        CURRENT THERAPY: TCHP  INTERVAL HISTORY: Lynn Mcgee 65 y.o. female returns for evaluation today while currently on neoadjuvant chemotherapy treatment.  She is accompanied by her daughter Lynn Mcgee who is in town visiting from [place].  Darnette did not see a provider with cycle 2 or 3 of her chemotherapy.  She notes that she is not having any peripheral neuropathy and the cancer in her breast is shrinking.  She does struggle with some side effects.  These include nausea and diarrhea.  She  is managing this with her antinausea medications and with Imodium.  Since we last saw her in November she has lost 11 pounds.  She notes that her appetite is just decreased.  She says that she feels she can only eat on days that she takes her dexamethasone.  Iriel notes that she has remained active and denies any further concerns today.   Patient Active Problem List   Diagnosis Date Noted   Vitamin D deficiency 11/26/2021   Mixed hyperlipidemia 11/26/2021   Anxiety 11/26/2021   Port-A-Cath in place 09/30/2021   Malignant neoplasm of upper-outer quadrant of right breast in female, estrogen receptor negative ([facility] [facility]) 09/16/2021   Hypertension 09/14/2021   Tobacco use 03/08/2021   Renal artery stenosis ([place]) 03/08/2021   Coronary artery disease involving native coronary artery of native heart without angina pectoris 03/08/2021   Acute congestive heart failure ([place]) 08/30/2020   Porokeratosis 10/17/2014   Verruca 10/17/2014   Pain in lower limb 10/17/2014    has No Known Allergies.  MEDICAL HISTORY: History reviewed. No pertinent past medical history.  SURGICAL HISTORY: Past Surgical History:  Procedure Laterality Date   IR IMAGING GUIDED PORT INSERTION  09/17/2021    SOCIAL HISTORY: Social History   Socioeconomic History   Marital status: Single    Spouse name: Not on file   Number of children: Not on file   Years of education: Not on file   Highest education level: Not on file  Occupational History   Not on file  Tobacco Use   Smoking status: Every Day    Types: Cigarettes  Smokeless tobacco: Never  Substance and Sexual Activity   Alcohol use: Not on file   Drug use: Not on file   Sexual activity: Not on file  Other Topics Concern   Not on file  Social History Narrative   Not on file   Social Determinants of Health   Financial Resource Strain: Not on file  Food Insecurity: Not on file  Transportation Needs: Not on file  Physical Activity: Not on file  Stress:  Not on file  Social Connections: Not on file  Intimate Partner Violence: Not on file    FAMILY HISTORY: History reviewed. No pertinent family history.  Review of Systems  Constitutional:  Positive for appetite change and unexpected weight change. Negative for chills, fatigue and fever.  HENT:   Negative for hearing loss, lump/mass and trouble swallowing.   Eyes:  Negative for eye problems and icterus.  Respiratory:  Negative for chest tightness, cough and shortness of breath.   Cardiovascular:  Negative for chest pain, leg swelling and palpitations.  Gastrointestinal:  Negative for abdominal distention, abdominal pain, constipation, diarrhea, nausea and vomiting.  Endocrine: Negative for hot flashes.  Genitourinary:  Negative for difficulty urinating.   Musculoskeletal:  Negative for arthralgias.  Skin:  Negative for itching and rash.  Neurological:  Negative for dizziness, extremity weakness, headaches and numbness.  Hematological:  Negative for adenopathy. Does not bruise/bleed easily.  Psychiatric/Behavioral:  Negative for depression. The patient is not nervous/anxious.      PHYSICAL EXAMINATION  ECOG PERFORMANCE STATUS: 1 - Symptomatic but completely ambulatory  Vitals:   11/26/21 1032  BP: (!) 158/85  Pulse: 73  Resp: 18  Temp: (!) 97.4 F (36.3 C)  SpO2: 95%    Physical Exam Constitutional:      General: She is not in acute distress.    Appearance: Normal appearance. She is not toxic-appearing.  HENT:     Head: Normocephalic and atraumatic.  Eyes:     General: No scleral icterus. Cardiovascular:     Rate and Rhythm: Normal rate and regular rhythm.     Pulses: Normal pulses.     Heart sounds: Normal heart sounds.  Pulmonary:     Effort: Pulmonary effort is normal.     Breath sounds: Normal breath sounds.     Comments: Right breast with softening mass noted.  Initially was as large as 10 cm however now it is approximately 5 to 6 cm. Abdominal:     General:  Abdomen is flat. Bowel sounds are normal. There is no distension.     Palpations: Abdomen is soft.     Tenderness: There is no abdominal tenderness.  Musculoskeletal:        General: No swelling.     Cervical back: Neck supple.  Lymphadenopathy:     Cervical: No cervical adenopathy.  Skin:    General: Skin is warm and dry.     Findings: No rash.  Neurological:     General: No focal deficit present.     Mental Status: She is alert.  Psychiatric:        Mood and Affect: Mood normal.        Behavior: Behavior normal.    LABORATORY DATA:  CBC    Component Value Date/Time   WBC 6.3 11/12/2021 0843   RBC 4.22 11/12/2021 0843   HGB 10.4 (L) 11/12/2021 0843   HCT 31.9 (L) 11/12/2021 0843   PLT 401 (H) 11/12/2021 0843   MCV 75.6 (L) 11/12/2021 8099  MCH 24.6 (L) 11/12/2021 0843   MCHC 32.6 11/12/2021 0843   RDW 17.3 (H) 11/12/2021 0843   LYMPHSABS 0.8 11/12/2021 0843   MONOABS 0.1 11/12/2021 0843   EOSABS 0.0 11/12/2021 0843   BASOSABS 0.0 11/12/2021 0843    CMP     Component Value Date/Time   NA 141 11/12/2021 0843   K 3.4 (L) 11/12/2021 0843   CL 104 11/12/2021 0843   CO2 25 11/12/2021 0843   GLUCOSE 209 (H) 11/12/2021 0843   BUN 9 11/12/2021 0843   CREATININE 0.72 11/12/2021 0843   CALCIUM 9.1 11/12/2021 0843   PROT 7.4 11/12/2021 0843   ALBUMIN 3.0 (L) 11/12/2021 0843   AST 11 (L) 11/12/2021 0843   ALT 6 11/12/2021 0843   ALKPHOS 102 11/12/2021 0843   BILITOT 0.4 11/12/2021 0843   GFRNONAA >60 11/12/2021 0843       ASSESSMENT and THERAPY PLAN:   Malignant neoplasm of upper-outer quadrant of right breast in female, estrogen receptor negative ([place]) 09/10/2021 right breast thickening and swelling: Mammogram revealed extremely hard right breast with peau d'orange skin changes, ultrasound revealed very large irregular mass measuring 11 cm with diffuse skin thickening, 3 abnormal right axillary lymph nodes, biopsy revealed grade 3 invasive mammary cancer with  pleomorphic features, lymph node positive, ER 0%, PR 0%, HER2 3+, Ki-67 30% Stage IIIb  Treatment plan: 1. Neoadjuvant chemotherapy with TCHP 2. Followed by breast conserving surgery with sentinel lymph node study vs targeted axillary dissection 3. 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 Haroldine Laws.  Lynn Mcgee is here today for follow-up of her locally advanced HER2 positive breast cancer.  1.  Locally advanced HER2 positive breast cancer: She will continue on her treatment with TCHP.  She is tolerating this moderately well.  She appears to be clinically responding based on her breast exam which shows that the tumor is softening up.  I have personally checked her schedule and with each chemotherapy appointment dates she has a provider appointment.  2..  Diarrhea and nausea: She will continue to take her antiemetics.  For her diarrhea she knows to take Imodium.  And she knows that if she subsequently gets constipated which is happened in the past that she can take a stool softener with water.  3.  Weight loss: She tells me that she has been seeing our dietitian.  She will take dexamethasone daily to see if this can help her regain some of her weight.  Should this continue she may need repeat imaging.  [place] will return next week for labs, follow-up with Dr. Lindi Adie, and her fourth cycle of TCHP.           All questions were answered. The patient knows to call the clinic with  any problems, questions or concerns. We can certainly see the patient much sooner if necessary.  Total encounter time: 30 minutes in face to face visit time, chart review, lab review, order entry, care coordination, and documentation of the encounter.   Wilber Bihari, NP 11/26/21 12:35 PM Medical Oncology and Hematology [facility] [place], [place] [address] Tel. [phone]    Fax.  [phone]  *Total Encounter Time as defined by the Centers for Medicare and Medicaid Services includes, in addition to the face-to-face time of a patient visit (documented in the note above) non-face-to-face time: obtaining and reviewing outside history, ordering and reviewing medications, tests or procedures, care coordination (communications with other health care professionals or caregivers) and documentation in the medical record.

## 2021-11-28 ENCOUNTER — Encounter: Payer: Self-pay | Admitting: Hematology and Oncology

## 2021-11-30 ENCOUNTER — Ambulatory Visit: Payer: 59

## 2021-12-02 MED FILL — Dexamethasone Sodium Phosphate Inj 100 MG/10ML: INTRAMUSCULAR | Qty: 1 | Status: AC

## 2021-12-02 MED FILL — Fosaprepitant Dimeglumine For IV Infusion 150 MG (Base Eq): INTRAVENOUS | Qty: 5 | Status: AC

## 2021-12-02 NOTE — Assessment & Plan Note (Signed)
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 Neoadjuvant chemotherapy withTCHP 2. Followed by breast conserving surgery with sentinel lymph node study vs targeted axillary dissection 3. 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  We will proceed with the treatment because of benefits of anti-HER2 therapy or tremendous)  Chemo Toxicities:  RTC in 2 weeks for cycle 2

## 2021-12-02 NOTE — Progress Notes (Signed)
Patient Care Team: Pcp, No as PCP - General Nicholas Lose, MD as Consulting Physician (Hematology and Oncology) Rockwell Germany, RN as Oncology Nurse Navigator Mauro Kaufmann, RN as Oncology Nurse Navigator  DIAGNOSIS:    ICD-10-CM   1. Microcytic anemia  D50.9 Ferritin    Iron and Iron Binding Capacity ([facility] only)    2. Malignant neoplasm of upper-outer quadrant of right breast in female, estrogen receptor negative ([place])  C50.411    Z17.1       SUMMARY OF ONCOLOGIC HISTORY: Oncology History  Malignant neoplasm of upper-outer quadrant of right breast in female, estrogen receptor negative ([place])  09/10/2021 Initial Diagnosis   Right breast thickening and swelling: Mammogram revealed extremely hard right breast with peau d'orange skin changes, ultrasound revealed very large irregular mass measuring 11 cm with diffuse skin thickening, 3 abnormal right axillary lymph nodes, biopsy revealed grade 3 invasive mammary cancer with pleomorphic features, lymph node positive, ER 0%, PR 0%, HER2 3+, Ki-67 30%   09/16/2021 Cancer Staging   Staging form: Breast, AJCC 8th Edition - Clinical stage from 09/16/2021: Stage IIIB (cT4d, cN1, cM0, G3, ER-, PR-, HER2+) - Signed by Nicholas Lose, MD on 09/16/2021 Stage prefix: Initial diagnosis Histologic grading system: 3 grade system    09/24/2021 -  Chemotherapy   Patient is on Treatment Plan : BREAST  Docetaxel + Carboplatin + Trastuzumab + Pertuzumab  (TCHP) q21d        CHIEF COMPLIANT: Follow-up of right breast cancer  INTERVAL HISTORY: Lynn Mcgee is a 66 y.o. with above-mentioned history of right breast cancer. She presents to the clinic today for follow-up.   ALLERGIES:  has No Known Allergies.  MEDICATIONS:  Current Outpatient Medications  Medication Sig Dispense Refill   amLODipine (NORVASC) 5 MG tablet Take 1 tablet (5 mg total) by mouth daily. 90 tablet 3   atorvastatin (LIPITOR) 40 MG tablet Take 40 mg by mouth daily.      carvedilol (COREG) 6.25 MG tablet Take 1 tablet (6.25 mg total) by mouth 2 (two) times daily. 60 tablet 3   dexamethasone (DECADRON) 4 MG tablet Take 1 tablet (4 mg total) by mouth daily. Take 1 tab day before chemo and 1 tab day after chemo with food 30 tablet 1   lidocaine-prilocaine (EMLA) cream Apply to affected area once 30 g 3   LORazepam (ATIVAN) 0.5 MG tablet Take 1 tablet (0.5 mg total) by mouth at bedtime as needed for sleep. 30 tablet 0   losartan (COZAAR) 50 MG tablet Take 50 mg by mouth daily.     ondansetron (ZOFRAN) 8 MG tablet Take 1 tablet (8 mg total) by mouth 2 (two) times daily as needed (Nausea or vomiting). Start on the third day after chemotherapy. 30 tablet 1   potassium chloride (KLOR-CON) 20 MEQ packet Take 20 mEq by mouth daily. 30 packet 1   prochlorperazine (COMPAZINE) 10 MG tablet Take 1 tablet (10 mg total) by mouth every 6 (six) hours as needed (Nausea or vomiting). 30 tablet 1   spironolactone (ALDACTONE) 25 MG tablet Take 1 tablet (25 mg total) by mouth daily. 30 tablet 6   No current facility-administered medications for this visit.    PHYSICAL EXAMINATION: ECOG PERFORMANCE STATUS: 1 - Symptomatic but completely ambulatory  Vitals:   12/03/21 0948  BP: (!) 175/86  Pulse: 66  Temp: (!) 97.5 F (36.4 C)  SpO2: 100%   Filed Weights   12/03/21 0948  Weight: 124 lb 4.8 oz (  56.4 kg)    BREAST: No palpable masses or nodules in either right or left breasts. No palpable axillary supraclavicular or infraclavicular adenopathy no breast tenderness or nipple discharge. (exam performed in the presence of a chaperone)  LABORATORY DATA:  I have reviewed the data as listed CMP Latest Ref Rng & Units 11/12/2021 10/22/2021 09/30/2021  Glucose 70 - 99 mg/dL 209(H) 88 92  BUN 8 - 23 mg/dL 9 6(L) 6(L)  Creatinine 0.44 - 1.00 mg/dL 0.72 0.64 0.58  Sodium 135 - 145 mmol/L 141 142 138  Potassium 3.5 - 5.1 mmol/L 3.4(L) 2.9(L) 3.2(L)  Chloride 98 - 111 mmol/L 104 104  103  CO2 22 - 32 mmol/L $RemoveB'25 26 26  'trXcRUpN$ Calcium 8.9 - 10.3 mg/dL 9.1 8.9 8.5(L)  Total Protein 6.5 - 8.1 g/dL 7.4 7.2 6.6  Total Bilirubin 0.3 - 1.2 mg/dL 0.4 0.5 0.7  Alkaline Phos 38 - 126 U/L 102 77 81  AST 15 - 41 U/L 11(L) 13(L) 12(L)  ALT 0 - 44 U/L $Remo'6 8 10    'sdHnf$ Lab Results  Component Value Date   WBC 12.3 (H) 12/03/2021   HGB 9.2 (L) 12/03/2021   HCT 28.5 (L) 12/03/2021   MCV 78.9 (L) 12/03/2021   PLT 284 12/03/2021   NEUTROABS 9.5 (H) 12/03/2021    ASSESSMENT & PLAN:  Malignant neoplasm of upper-outer quadrant of right breast in female, estrogen receptor negative ([place]) 09/10/2021 right breast thickening and swelling: Mammogram revealed extremely hard right breast with peau d'orange skin changes, ultrasound revealed very large irregular mass measuring 11 cm with diffuse skin thickening, 3 abnormal right axillary lymph nodes, biopsy revealed grade 3 invasive mammary cancer with pleomorphic features, lymph node positive, ER 0%, PR 0%, HER2 3+, Ki-67 30% Stage IIIb   Treatment plan: 1. Neoadjuvant chemotherapy with TCHP 2. Followed by breast conserving surgery with sentinel lymph node study vs targeted axillary dissection 3. 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  We will proceed with the treatment because of benefits of anti-HER2 therapy or tremendous)   Chemo Toxicities: Denies any diarrhea or nausea or vomiting. Decreased appetite and weight loss: She was given dexamethasone 4 mg and she started eating very  well and picked up on her weight.  I instructed her to stop the steroids at this time. Chemo induced anemia: Hemoglobin is 9.2.  It is microcytic in nature therefore I will send for iron studies and ferritin. Denies peripheral neuropathy.  RTC in 3 weeks for cycle 5  Orders Placed This Encounter  Procedures   Ferritin    Standing Status:   Future    Standing Expiration Date:   12/03/2022   Iron and Iron Binding Capacity ([facility] only)    Standing Status:   Future    Standing Expiration Date:   12/03/2022   The patient has a good understanding of the overall plan. she agrees with it. she will call with any problems that may develop before the next visit here.  Total time spent: 20 mins including face to face time and time spent for planning, charting and coordination of care  Rulon Eisenmenger, MD, MPH 12/03/2021  I, Thana Ates, am acting  acting as scribe for Dr. Nicholas Lose.  I have reviewed the above documentation for accuracy and completeness, and I agree with the above.

## 2021-12-03 ENCOUNTER — Inpatient Hospital Stay: Payer: Medicare HMO | Admitting: Nutrition

## 2021-12-03 ENCOUNTER — Encounter: Payer: Self-pay | Admitting: Hematology and Oncology

## 2021-12-03 ENCOUNTER — Inpatient Hospital Stay (HOSPITAL_BASED_OUTPATIENT_CLINIC_OR_DEPARTMENT_OTHER): Payer: Medicare HMO | Admitting: Hematology and Oncology

## 2021-12-03 ENCOUNTER — Other Ambulatory Visit: Payer: Self-pay | Admitting: *Deleted

## 2021-12-03 ENCOUNTER — Inpatient Hospital Stay: Payer: Medicare HMO

## 2021-12-03 ENCOUNTER — Other Ambulatory Visit: Payer: Self-pay

## 2021-12-03 ENCOUNTER — Inpatient Hospital Stay: Payer: Medicare HMO | Attending: Hematology and Oncology

## 2021-12-03 VITALS — BP 175/86 | HR 66 | Temp 97.5°F | Wt 124.3 lb

## 2021-12-03 DIAGNOSIS — Z171 Estrogen receptor negative status [ER-]: Secondary | ICD-10-CM | POA: Diagnosis not present

## 2021-12-03 DIAGNOSIS — Z79899 Other long term (current) drug therapy: Secondary | ICD-10-CM | POA: Diagnosis not present

## 2021-12-03 DIAGNOSIS — Z5189 Encounter for other specified aftercare: Secondary | ICD-10-CM | POA: Insufficient documentation

## 2021-12-03 DIAGNOSIS — C773 Secondary and unspecified malignant neoplasm of axilla and upper limb lymph nodes: Secondary | ICD-10-CM | POA: Insufficient documentation

## 2021-12-03 DIAGNOSIS — I10 Essential (primary) hypertension: Secondary | ICD-10-CM | POA: Diagnosis not present

## 2021-12-03 DIAGNOSIS — D509 Iron deficiency anemia, unspecified: Secondary | ICD-10-CM | POA: Insufficient documentation

## 2021-12-03 DIAGNOSIS — Z5111 Encounter for antineoplastic chemotherapy: Secondary | ICD-10-CM | POA: Insufficient documentation

## 2021-12-03 DIAGNOSIS — C50411 Malignant neoplasm of upper-outer quadrant of right female breast: Secondary | ICD-10-CM

## 2021-12-03 DIAGNOSIS — Z5112 Encounter for antineoplastic immunotherapy: Secondary | ICD-10-CM | POA: Insufficient documentation

## 2021-12-03 DIAGNOSIS — Z95828 Presence of other vascular implants and grafts: Secondary | ICD-10-CM

## 2021-12-03 DIAGNOSIS — T451X5A Adverse effect of antineoplastic and immunosuppressive drugs, initial encounter: Secondary | ICD-10-CM | POA: Diagnosis not present

## 2021-12-03 DIAGNOSIS — D6481 Anemia due to antineoplastic chemotherapy: Secondary | ICD-10-CM | POA: Diagnosis not present

## 2021-12-03 LAB — CMP (CANCER CENTER ONLY)
ALT: 8 U/L (ref 0–44)
AST: 11 U/L — ABNORMAL LOW (ref 15–41)
Albumin: 3.3 g/dL — ABNORMAL LOW (ref 3.5–5.0)
Alkaline Phosphatase: 79 U/L (ref 38–126)
Anion gap: 7 (ref 5–15)
BUN: 19 mg/dL (ref 8–23)
CO2: 27 mmol/L (ref 22–32)
Calcium: 8.6 mg/dL — ABNORMAL LOW (ref 8.9–10.3)
Chloride: 106 mmol/L (ref 98–111)
Creatinine: 0.57 mg/dL (ref 0.44–1.00)
GFR, Estimated: >60 mL/min (ref >60)
Glucose, Bld: 109 mg/dL — ABNORMAL HIGH (ref 70–99)
Potassium: 3.2 mmol/L — ABNORMAL LOW (ref 3.5–5.1)
Sodium: 140 mmol/L (ref 135–145)
Total Bilirubin: 0.3 mg/dL (ref 0.3–1.2)
Total Protein: 6.1 g/dL — ABNORMAL LOW (ref 6.5–8.1)

## 2021-12-03 LAB — CBC WITH DIFFERENTIAL (CANCER CENTER ONLY)
Abs Immature Granulocytes: 0.08 10*3/uL — ABNORMAL HIGH (ref 0.00–0.07)
Basophils Absolute: 0 10*3/uL (ref 0.0–0.1)
Basophils Relative: 0 %
Eosinophils Absolute: 0 10*3/uL (ref 0.0–0.5)
Eosinophils Relative: 0 %
HCT: 28.5 % — ABNORMAL LOW (ref 36.0–46.0)
Hemoglobin: 9.2 g/dL — ABNORMAL LOW (ref 12.0–15.0)
Immature Granulocytes: 1 %
Lymphocytes Relative: 16 %
Lymphs Abs: 1.9 10*3/uL (ref 0.7–4.0)
MCH: 25.5 pg — ABNORMAL LOW (ref 26.0–34.0)
MCHC: 32.3 g/dL (ref 30.0–36.0)
MCV: 78.9 fL — ABNORMAL LOW (ref 80.0–100.0)
Monocytes Absolute: 0.7 10*3/uL (ref 0.1–1.0)
Monocytes Relative: 6 %
Neutro Abs: 9.5 10*3/uL — ABNORMAL HIGH (ref 1.7–7.7)
Neutrophils Relative %: 77 %
Platelet Count: 284 10*3/uL (ref 150–400)
RBC: 3.61 MIL/uL — ABNORMAL LOW (ref 3.87–5.11)
RDW: 19.2 % — ABNORMAL HIGH (ref 11.5–15.5)
WBC Count: 12.3 10*3/uL — ABNORMAL HIGH (ref 4.0–10.5)
nRBC: 0 % (ref 0.0–0.2)

## 2021-12-03 LAB — IRON AND IRON BINDING CAPACITY (CC-WL,HP ONLY)
Iron: 68 ug/dL (ref 28–170)
Saturation Ratios: 24 % (ref 10.4–31.8)
TIBC: 288 ug/dL (ref 250–450)
UIBC: 220 ug/dL (ref 148–442)

## 2021-12-03 LAB — FERRITIN: Ferritin: 218 ng/mL (ref 11–307)

## 2021-12-03 MED ORDER — SODIUM CHLORIDE 0.9% FLUSH
10.0000 mL | INTRAVENOUS | Status: DC | PRN
  Administered 2021-12-03: 10 mL

## 2021-12-03 MED ORDER — PALONOSETRON HCL INJECTION 0.25 MG/5ML
0.2500 mg | Freq: Once | INTRAVENOUS | Status: AC
  Administered 2021-12-03: 0.25 mg via INTRAVENOUS
  Filled 2021-12-03: qty 5

## 2021-12-03 MED ORDER — SODIUM CHLORIDE 0.9 % IV SOLN
75.0000 mg/m2 | Freq: Once | INTRAVENOUS | Status: AC
  Administered 2021-12-03: 120 mg via INTRAVENOUS
  Filled 2021-12-03: qty 12

## 2021-12-03 MED ORDER — SODIUM CHLORIDE 0.9 % IV SOLN
10.0000 mg | Freq: Once | INTRAVENOUS | Status: AC
  Administered 2021-12-03: 10 mg via INTRAVENOUS
  Filled 2021-12-03: qty 10

## 2021-12-03 MED ORDER — HEPARIN SOD (PORK) LOCK FLUSH 100 UNIT/ML IV SOLN
500.0000 [IU] | Freq: Once | INTRAVENOUS | Status: AC | PRN
  Administered 2021-12-03: 500 [IU]

## 2021-12-03 MED ORDER — SODIUM CHLORIDE 0.9 % IV SOLN: Freq: Once | INTRAVENOUS | Status: AC

## 2021-12-03 MED ORDER — DIPHENHYDRAMINE HCL 25 MG PO CAPS
50.0000 mg | ORAL_CAPSULE | Freq: Once | ORAL | Status: AC
  Administered 2021-12-03: 50 mg via ORAL
  Filled 2021-12-03: qty 2

## 2021-12-03 MED ORDER — TRASTUZUMAB-ANNS CHEMO 150 MG IV SOLR
6.0000 mg/kg | Freq: Once | INTRAVENOUS | Status: AC
  Administered 2021-12-03: 336 mg via INTRAVENOUS
  Filled 2021-12-03: qty 16

## 2021-12-03 MED ORDER — SODIUM CHLORIDE 0.9 % IV SOLN
456.0000 mg | Freq: Once | INTRAVENOUS | Status: AC
  Administered 2021-12-03: 460 mg via INTRAVENOUS
  Filled 2021-12-03: qty 46

## 2021-12-03 MED ORDER — ACETAMINOPHEN 325 MG PO TABS
650.0000 mg | ORAL_TABLET | Freq: Once | ORAL | Status: AC
  Administered 2021-12-03: 650 mg via ORAL
  Filled 2021-12-03: qty 2

## 2021-12-03 MED ORDER — SODIUM CHLORIDE 0.9 % IV SOLN
420.0000 mg | Freq: Once | INTRAVENOUS | Status: AC
  Administered 2021-12-03: 420 mg via INTRAVENOUS
  Filled 2021-12-03: qty 14

## 2021-12-03 MED ORDER — SODIUM CHLORIDE 0.9 % IV SOLN
150.0000 mg | Freq: Once | INTRAVENOUS | Status: AC
  Administered 2021-12-03: 150 mg via INTRAVENOUS
  Filled 2021-12-03: qty 150

## 2021-12-03 MED ORDER — SODIUM CHLORIDE 0.9% FLUSH
10.0000 mL | Freq: Once | INTRAVENOUS | Status: AC
  Administered 2021-12-03: 10 mL

## 2021-12-03 NOTE — Progress Notes (Signed)
Nutrition follow up completed with patient during infusion for breast cancer.  Wt improved and documented as 124.3 pounds from 115.2 pounds Dec 30. Labs noted: K 3.2, Glucose 109, and Alb 3.3  Patient reports she is eating well on steroids and wishes she could stay on them. She denies nutrition impact symptoms at this time. She has no questions or concerns.  Nutrition Diagnosis: Unintentional wt loss resolved.  Patient encouraged to contact RD with questions or concerns.   **Disclaimer: This note was dictated with voice recognition software. Similar sounding words can inadvertently be transcribed and this note may contain transcription errors which may not have been corrected upon publication of note.**

## 2021-12-03 NOTE — Patient Instructions (Signed)
[facility]   Discharge Instructions: Thank you for choosing [facility] to provide your oncology and hematology care.   If you have a lab appointment with the [facility], please go directly to the [facility] and check in at the registration area.   Wear comfortable clothing and clothing appropriate for easy access to any Portacath or PICC line.   We strive to give you quality time with your provider. You may need to reschedule your appointment if you arrive late (15 or more minutes).  Arriving late affects you and other patients whose appointments are after yours.  Also, if you miss three or more appointments without notifying the office, you may be dismissed from the clinic at the providers discretion.      For prescription refill requests, have your pharmacy contact our office and allow 72 hours for refills to be completed.    Today you received the following chemotherapy and/or immunotherapy agents: trastuzumab-anns, pertuzumab, docetaxel and carboplatin.      To help prevent nausea and vomiting after your treatment, we encourage you to take your nausea medication as directed.  BELOW ARE SYMPTOMS THAT SHOULD BE REPORTED IMMEDIATELY: *FEVER GREATER THAN 100.4 F (38 C) OR HIGHER *CHILLS OR SWEATING *NAUSEA AND VOMITING THAT IS NOT CONTROLLED WITH YOUR NAUSEA MEDICATION *UNUSUAL SHORTNESS OF BREATH *UNUSUAL BRUISING OR BLEEDING *URINARY PROBLEMS (pain or burning when urinating, or frequent urination) *BOWEL PROBLEMS (unusual diarrhea, constipation, pain near the anus) TENDERNESS IN MOUTH AND THROAT WITH OR WITHOUT PRESENCE OF ULCERS (sore throat, sores in mouth, or a toothache) UNUSUAL RASH, SWELLING OR PAIN  UNUSUAL VAGINAL DISCHARGE OR ITCHING   Items with * indicate a potential emergency and should be followed up as soon as possible or go to the Emergency Department if any problems should occur.  Please show the CHEMOTHERAPY ALERT  CARD or IMMUNOTHERAPY ALERT CARD at check-in to the Emergency Department and triage nurse.  Should you have questions after your visit or need to cancel or reschedule your appointment, please contact [facility]  Dept: [phone]  and follow the prompts.  Office hours are 8:00 a.m. to 4:30 p.m. Monday - Friday. Please note that voicemails left after 4:00 p.m. may not be returned until the following business day.  We are closed weekends and major holidays. You have access to a nurse at all times for urgent questions. Please call the main number to the clinic Dept: [phone] and follow the prompts.   For any non-urgent questions, you may also contact your provider using MyChart. We now offer e-Visits for anyone 37 and older to request care online for non-urgent symptoms. For details visit [email or web address].   Also download the MyChart app! Go to the app store, search "MyChart", open the app, select [facility], and log in with your MyChart username and password.  Due to Covid, a mask is required upon entering the hospital/clinic. If you do not have a mask, one will be given to you upon arrival. For doctor visits, patients may have 1 support person aged 69 or older with them. For treatment visits, patients cannot have anyone with them due to current Covid guidelines and our immunocompromised population.

## 2021-12-06 ENCOUNTER — Other Ambulatory Visit: Payer: Self-pay

## 2021-12-06 ENCOUNTER — Inpatient Hospital Stay: Payer: Medicare HMO

## 2021-12-06 VITALS — BP 164/84 | HR 73 | Temp 99.1°F | Resp 18

## 2021-12-06 DIAGNOSIS — Z171 Estrogen receptor negative status [ER-]: Secondary | ICD-10-CM

## 2021-12-06 DIAGNOSIS — Z5112 Encounter for antineoplastic immunotherapy: Secondary | ICD-10-CM | POA: Diagnosis not present

## 2021-12-06 MED ORDER — PEGFILGRASTIM-CBQV 6 MG/0.6ML ~~LOC~~ SOSY
6.0000 mg | PREFILLED_SYRINGE | Freq: Once | SUBCUTANEOUS | Status: AC
  Administered 2021-12-06: 6 mg via SUBCUTANEOUS
  Filled 2021-12-06: qty 0.6

## 2021-12-06 NOTE — Progress Notes (Signed)
Pt came in with blood pressure of 164/84, asymptomatic and stated she took her medication at home for her blood pressure already. Heinz Knuckles RN and Dr.Gudena were made aware and instructed this LPN to tell pt to check blood pressure at home and if it remains high to f/u with her cardiologist or pcp. Pt understands.

## 2021-12-09 ENCOUNTER — Telehealth: Payer: Self-pay | Admitting: *Deleted

## 2021-12-09 NOTE — Telephone Encounter (Signed)
Received call from pt with complaint of increase salivation and upset stomach.  Pt states symptoms began after taking P.o Zofran and p.o compazine.  Pt states she was not nauseated prior to taking medication and assumed she should take it as a preventative measure.  RN encouraged pt to stop taking medications at this time and to take at the onset of nausea to see if symptoms resolve.  Pt states she will not take medication today unless needed and will contact our office tomorrow if symptoms persist.

## 2021-12-10 ENCOUNTER — Telehealth: Payer: Self-pay | Admitting: Hematology and Oncology

## 2021-12-10 ENCOUNTER — Encounter (HOSPITAL_COMMUNITY): Payer: 59 | Admitting: Internal Medicine

## 2021-12-10 NOTE — Telephone Encounter (Signed)
I got in touch with the cardiologist and was able to get patient rescheduled, spoke with daughter Danae Chen and gave new appointment also sent her a link to get her mom signed up for my chart as well

## 2021-12-10 NOTE — Telephone Encounter (Signed)
Spoke with patients daughter who said patient was to weak to see cardiologist and wanted me to get that appointment rescheduled or her

## 2021-12-17 ENCOUNTER — Ambulatory Visit: Payer: 59 | Admitting: Hematology and Oncology

## 2021-12-17 ENCOUNTER — Ambulatory Visit: Payer: 59

## 2021-12-17 ENCOUNTER — Other Ambulatory Visit: Payer: 59

## 2021-12-22 NOTE — Progress Notes (Signed)
Patient Care Team: Pcp, No as PCP - General Nicholas Lose, MD as Consulting Physician (Hematology and Oncology) Rockwell Germany, RN as Oncology Nurse Navigator Mauro Kaufmann, RN as Oncology Nurse Navigator  DIAGNOSIS:    ICD-10-CM   1. Malignant neoplasm of upper-outer quadrant of right breast in female, estrogen receptor negative ([place])  C50.411    Z17.1       SUMMARY OF ONCOLOGIC HISTORY: Oncology History  Malignant neoplasm of upper-outer quadrant of right breast in female, estrogen receptor negative ([place])  09/10/2021 Initial Diagnosis   Right breast thickening and swelling: Mammogram revealed extremely hard right breast with peau d'orange skin changes, ultrasound revealed very large irregular mass measuring 11 cm with diffuse skin thickening, 3 abnormal right axillary lymph nodes, biopsy revealed grade 3 invasive mammary cancer with pleomorphic features, lymph node positive, ER 0%, PR 0%, HER2 3+, Ki-67 30%   09/16/2021 Cancer Staging   Staging form: Breast, AJCC 8th Edition - Clinical stage from 09/16/2021: Stage IIIB (cT4d, cN1, cM0, G3, ER-, PR-, HER2+) - Signed by Nicholas Lose, MD on 09/16/2021 Stage prefix: Initial diagnosis Histologic grading system: 3 grade system    09/24/2021 -  Chemotherapy   Patient is on Treatment Plan : BREAST  Docetaxel + Carboplatin + Trastuzumab + Pertuzumab  (TCHP) q21d        CHIEF COMPLIANT: Cycle 5 TCHP  INTERVAL HISTORY: Lynn Mcgee is a 66 y.o. with above-mentioned history of right breast cancer, currently on chemotherapy with TCHP. She presents to the clinic today for treatment.  Other than 2 to 3 days of nausea she is tolerated chemo extremely well.  Denies any peripheral neuropathy.  She had hypokalemia and was prescribed potassium but she has not been taking it.  Therefore today's potassium level is very low at 2.7.  She denies any diarrhea.  ALLERGIES:  has No Known Allergies.  MEDICATIONS:  Current Outpatient Medications   Medication Sig Dispense Refill   amLODipine (NORVASC) 5 MG tablet Take 1 tablet (5 mg total) by mouth daily. 90 tablet 3   atorvastatin (LIPITOR) 40 MG tablet Take 40 mg by mouth daily.     carvedilol (COREG) 6.25 MG tablet Take 1 tablet (6.25 mg total) by mouth 2 (two) times daily. 60 tablet 3   dexamethasone (DECADRON) 4 MG tablet Take 1 tablet (4 mg total) by mouth daily. Take 1 tab day before chemo and 1 tab day after chemo with food 30 tablet 1   lidocaine-prilocaine (EMLA) cream Apply to affected area once 30 g 3   losartan (COZAAR) 100 MG tablet Take 1 tablet (100 mg total) by mouth daily. 30 tablet 3   ondansetron (ZOFRAN) 8 MG tablet Take 1 tablet (8 mg total) by mouth 2 (two) times daily as needed (Nausea or vomiting). Start on the third day after chemotherapy. 30 tablet 1   potassium chloride (KLOR-CON) 20 MEQ packet Take 20 mEq by mouth daily. 30 packet 1   prochlorperazine (COMPAZINE) 10 MG tablet Take 1 tablet (10 mg total) by mouth every 6 (six) hours as needed (Nausea or vomiting). 30 tablet 1   spironolactone (ALDACTONE) 25 MG tablet Take 1 tablet (25 mg total) by mouth daily. 30 tablet 6   No current facility-administered medications for this visit.    PHYSICAL EXAMINATION: ECOG PERFORMANCE STATUS: 1 - Symptomatic but completely ambulatory  Vitals:   12/24/21 1024  BP: (!) 189/89  Pulse: 83  Resp: 18  Temp: 98.1 F (36.7 C)  SpO2:  100%   Filed Weights   12/24/21 1024  Weight: 117 lb 9.6 oz (53.3 kg)    LABORATORY DATA:  I have reviewed the data as listed CMP Latest Ref Rng & Units 12/24/2021 12/03/2021 11/12/2021  Glucose 70 - 99 mg/dL 88 109(H) 209(H)  BUN 8 - 23 mg/dL 8 19 9   Creatinine 0.44 - 1.00 mg/dL 0.50 0.57 0.72  Sodium 135 - 145 mmol/L 142 140 141  Potassium 3.5 - 5.1 mmol/L 2.7(LL) 3.2(L) 3.4(L)  Chloride 98 - 111 mmol/L 104 106 104  CO2 22 - 32 mmol/L 30 27 25   Calcium 8.9 - 10.3 mg/dL 8.2(L) 8.6(L) 9.1  Total Protein 6.5 - 8.1 g/dL 6.3(L)  6.1(L) 7.4  Total Bilirubin 0.3 - 1.2 mg/dL 0.4 0.3 0.4  Alkaline Phos 38 - 126 U/L 81 79 102  AST 15 - 41 U/L 9(L) 11(L) 11(L)  ALT 0 - 44 U/L 5 8 6     Lab Results  Component Value Date   WBC 5.5 12/24/2021   HGB 8.7 (L) 12/24/2021   HCT 26.3 (L) 12/24/2021   MCV 78.0 (L) 12/24/2021   PLT 352 12/24/2021   NEUTROABS 2.5 12/24/2021    ASSESSMENT & PLAN:  Malignant neoplasm of upper-outer quadrant of right breast in female, estrogen receptor negative ([place]) 09/10/2021 right breast thickening and swelling: Mammogram revealed extremely hard right breast with peau d'orange skin changes, ultrasound revealed very large irregular mass measuring 11 cm with diffuse skin thickening, 3 abnormal right axillary lymph nodes, biopsy revealed grade 3 invasive mammary cancer with pleomorphic features, lymph node positive, ER 0%, PR 0%, HER2 3+, Ki-67 30% Stage IIIb   Treatment plan: 1. Neoadjuvant chemotherapy with TCHP 2. Followed by breast conserving surgery with sentinel lymph node study vs targeted axillary dissection 3. 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  We will proceed with the treatment because of benefits of anti-HER2 therapy or tremendous)   Chemo Toxicities: Denies any diarrhea or nausea or vomiting. Decreased appetite and weight loss  2. Chemo induced anemia: Hemoglobin is 8.7  It is microcytic in nature.  Ferritin 218, iron saturation 24%, TIBC 288.   Most likely cause of anemia is chemo induced bone marrow  suppression.  Denies peripheral neuropathy. We will plan for breast MRI and surgery follow-up soon after RTC in 3 weeks for cycle 6    No orders of the defined types were placed in this encounter.  The patient has a good understanding of the overall plan. she agrees with it. she will call with any problems that may develop before the next visit here.  Total time spent: 30 mins including face to face time and time spent for planning, charting and coordination of care  Rulon Eisenmenger, MD, MPH 12/24/2021  I, Thana Ates, am acting as scribe for Dr. Nicholas Lose.  I have reviewed the above documentation for accuracy and completeness, and I agree with the above.

## 2021-12-23 ENCOUNTER — Other Ambulatory Visit: Payer: Self-pay

## 2021-12-23 ENCOUNTER — Ambulatory Visit (HOSPITAL_COMMUNITY)
Admission: RE | Admit: 2021-12-23 | Discharge: 2021-12-23 | Disposition: A | Discharge status: Home / Self Care | Payer: Medicare HMO | Source: Ambulatory Visit | Attending: Internal Medicine | Admitting: Internal Medicine

## 2021-12-23 ENCOUNTER — Encounter (HOSPITAL_COMMUNITY): Payer: Self-pay | Admitting: Internal Medicine

## 2021-12-23 VITALS — BP 144/78 | HR 71 | Wt 121.2 lb

## 2021-12-23 DIAGNOSIS — I110 Hypertensive heart disease with heart failure: Secondary | ICD-10-CM | POA: Diagnosis present

## 2021-12-23 DIAGNOSIS — Z79899 Other long term (current) drug therapy: Secondary | ICD-10-CM | POA: Diagnosis not present

## 2021-12-23 DIAGNOSIS — Z79811 Long term (current) use of aromatase inhibitors: Secondary | ICD-10-CM | POA: Diagnosis not present

## 2021-12-23 DIAGNOSIS — C50411 Malignant neoplasm of upper-outer quadrant of right female breast: Secondary | ICD-10-CM

## 2021-12-23 DIAGNOSIS — Z8249 Family history of ischemic heart disease and other diseases of the circulatory system: Secondary | ICD-10-CM | POA: Insufficient documentation

## 2021-12-23 DIAGNOSIS — Z720 Tobacco use: Secondary | ICD-10-CM | POA: Diagnosis not present

## 2021-12-23 DIAGNOSIS — Z79810 Long term (current) use of selective estrogen receptor modulators (SERMs): Secondary | ICD-10-CM | POA: Insufficient documentation

## 2021-12-23 DIAGNOSIS — F17210 Nicotine dependence, cigarettes, uncomplicated: Secondary | ICD-10-CM | POA: Diagnosis not present

## 2021-12-23 DIAGNOSIS — I5022 Chronic systolic (congestive) heart failure: Secondary | ICD-10-CM | POA: Diagnosis not present

## 2021-12-23 DIAGNOSIS — Z171 Estrogen receptor negative status [ER-]: Secondary | ICD-10-CM | POA: Insufficient documentation

## 2021-12-23 DIAGNOSIS — C50919 Malignant neoplasm of unspecified site of unspecified female breast: Secondary | ICD-10-CM | POA: Insufficient documentation

## 2021-12-23 DIAGNOSIS — I10 Essential (primary) hypertension: Secondary | ICD-10-CM | POA: Diagnosis not present

## 2021-12-23 MED ORDER — LOSARTAN POTASSIUM 100 MG PO TABS: 100.0000 mg | ORAL_TABLET | Freq: Every day | ORAL | 3 refills | Status: DC

## 2021-12-23 MED FILL — Dexamethasone Sodium Phosphate Inj 100 MG/10ML: INTRAMUSCULAR | Qty: 1 | Status: AC

## 2021-12-23 MED FILL — Fosaprepitant Dimeglumine For IV Infusion 150 MG (Base Eq): INTRAVENOUS | Qty: 5 | Status: AC

## 2021-12-23 NOTE — Progress Notes (Signed)
Error

## 2021-12-23 NOTE — Patient Instructions (Signed)
Increase Losartan to 100 mg Daily  Your physician recommends that you schedule a follow-up appointment in: 2 months with an echocardiogram  If you have any questions or concerns before your next appointment please send [place] a message through [facility] or call our office at [phone].    TO LEAVE A MESSAGE FOR THE NURSE SELECT OPTION 2, PLEASE LEAVE A MESSAGE INCLUDING: YOUR NAME DATE OF BIRTH CALL BACK NUMBER REASON FOR CALL**this is important as we prioritize the call backs  YOU WILL RECEIVE A CALL BACK THE SAME DAY AS LONG AS YOU CALL BEFORE 4:00 PM  At the [facility], you and your health needs are our priority. As part of our continuing mission to provide you with exceptional heart care, we have created designated Provider Care Teams. These Care Teams include your primary Cardiologist (physician) and Advanced Practice Providers (APPs- Physician Assistants and Nurse Practitioners) who all work together to provide you with the care you need, when you need it.   You may see any of the following providers on your designated Care Team at your next follow up: Dr Glori Bickers Dr Haynes Kerns, NP Lyda Jester, [place] [facility] [place], [place] Audry Riles, PharmD   Please be sure to bring in all your medications bottles to every appointment.

## 2021-12-23 NOTE — Addendum Note (Signed)
Encounter addended by: Scarlette Calico, RN on: 12/23/2021 4:19 PM  Actions taken: Order list changed, Clinical Note Signed, Charge Capture section accepted

## 2021-12-23 NOTE — Addendum Note (Signed)
Encounter addended by: Scarlette Calico, RN on: 12/23/2021 4:21 PM  Actions taken: Order list changed, Diagnosis association updated

## 2021-12-23 NOTE — Progress Notes (Signed)
ADVANCED [facility] CLINIC  NOTE  Referring Physician: Dr Lindi Adie  Primary Care:Dr Osei Bonsu Primary Cardiologist: none  Oncology: Dr Lindi Adie.   HPI: Lynn Mcgee is a 66 year old with history of HTN, smoker, breast cancer, chronic systolic HF  Diagnosed with HF in 10/21 at [facility]. - Echo 10/21 ([place]) EF 40-45% - Myoview 10/21 EF 36% ? anterior wall defect -> treated medically   Subsequently found to have breast CA. Followed by Dr Lindi Adie - Stage IIIB (cT4d, cN1, cM0, G3, ER-, PR-, HER2+).   Pre-chemo echo 10/22: EF 45-50% , Grade I DD RV normal   Started Docetaxel + Carboplatin + Trastuzumab + Pertuzumab  (TCHP) q21d on 09/23/21.   Scans on 09/28/21 no sign of metastatic disease.   Echo 11/09/21; EF 45-50% [facility]. Lynn Mcgee was increased to 25 mg daily   Returns today for 3 month f/u. BP moderately elevated, 144/78. Her wt is up 5 lb from previous visit, from 116>>121 lb today. Says she is feeling ok. Not taking her BP regularly. Denies CP or SOB. Still has 2 more chemo treatments. Still smoking 10 cigarettes/day    SH: smokes 10 cigarettes per day. No alcohol. Occasionally smokes marijuana.   FH: Brother heart failure. Mom HTN    History reviewed. No pertinent past medical history.  Current Outpatient Medications  Medication Sig Dispense Refill   amLODipine (NORVASC) 5 MG tablet Take 1 tablet (5 mg total) by mouth daily. 90 tablet 3   atorvastatin (LIPITOR) 40 MG tablet Take 40 mg by mouth daily.     carvedilol (COREG) 6.25 MG tablet Take 1 tablet (6.25 mg total) by mouth 2 (two) times daily. 60 tablet 3   dexamethasone (DECADRON) 4 MG tablet Take 1 tablet (4 mg total) by mouth daily. Take 1 tab day before chemo and 1 tab day after chemo with food 30 tablet 1   lidocaine-prilocaine (EMLA) cream Apply to affected area once 30 g 3   losartan (COZAAR) 50 MG tablet Take 50 mg by mouth daily.     ondansetron (ZOFRAN) 8 MG tablet Take 1 tablet (8 mg total) by mouth 2 (two) times  daily as needed (Nausea or vomiting). Start on the third day after chemotherapy. 30 tablet 1   potassium chloride (KLOR-CON) 20 MEQ packet Take 20 mEq by mouth daily. 30 packet 1   prochlorperazine (COMPAZINE) 10 MG tablet Take 1 tablet (10 mg total) by mouth every 6 (six) hours as needed (Nausea or vomiting). 30 tablet 1   spironolactone (ALDACTONE) 25 MG tablet Take 1 tablet (25 mg total) by mouth daily. 30 tablet 6   No current facility-administered medications for this encounter.    No Known Allergies    Social History   Socioeconomic History   Marital status: Single    Spouse name: Not on file   Number of children: Not on file   Years of education: Not on file   Highest education level: Not on file  Occupational History   Not on file  Tobacco Use   Smoking status: Every Day    Types: Cigarettes   Smokeless tobacco: Never  Substance and Sexual Activity   Alcohol use: Not on file   Drug use: Not on file   Sexual activity: Not on file  Other Topics Concern   Not on file  Social History Narrative   Not on file   Social Determinants of Health   Financial Resource Strain: Not on file  Food Insecurity:  Not on file  Transportation Needs: Not on file  Physical Activity: Not on file  Stress: Not on file  Social Connections: Not on file  Intimate Partner Violence: Not on file     Vitals:   12/23/21 1531  BP: (!) 144/78  Pulse: 71  SpO2: 97%  Weight: 55 kg (121 lb 3.2 oz)    PHYSICAL EXAM: General:  Elderly thin No resp difficulty HEENT: normal Neck: supple. no JVD. Carotids 2+ bilat; no bruits. No lymphadenopathy or thryomegaly appreciated. Cor: PMI nondisplaced. Regular rate & rhythm. No rubs, gallops or murmurs. Lungs: decreased throughout  Abdomen: soft, nontender, nondistended. No hepatosplenomegaly. No bruits or masses. Good bowel sounds. Extremities: no cyanosis, clubbing, rash, edema Neuro: alert & orientedx3, cranial nerves grossly intact. moves all 4  extremities w/o difficulty. Affect pleasant   ASSESSMENT & PLAN:  1. Chronic systolic HF - Echo 98/42 ([place]) EF 40-45% - Myoview 10/21 EF 36% ? anterior wall defect -> treated medically - CT chest  10/22 + 3v coronary calcium - Echo 10/22 (pre-chemo) EF 45-50% No RWMA  - Stable NYHA I-II Volume status stable. No diuretics - Increase losartan to 100 mg daily - Continue spiro 25 mg daily - Continue carvedilol at 6.25 mg  bid - LV dysfunction predated chemotherapy. 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 Does have evidence of CAD but no ischemic symptoms or regional wall motion abnormalities. - Focus on aggressive medical therapy with GDMT and tight control of BP. We discussed again. Repeat echo in 6 weeks. If EF worsening or develops ischemic symptoms will need cath.   2. Breast Cancer  - Stage IIIB (cT4d, cN1, cM0, G3, ER-, PR-, HER2+)  - Had Echo 09/22/21 EF 45-50% -->Pre chemo - Started on  Docetaxel + Carboplatin + Trastuzumab + Pertuzumab  (TCHP) q21d  on 09/23/21 - Echo 11/09/21 EF 50-55%  - Continue chemo + H/P - Follow with echos q3 months. Will recheck in March  3. HTN  - remains elevated  - Increasing losartan  4. Tobacco Abuse - discussed need for cessation  Glori Bickers, MD  4:02 PM

## 2021-12-24 ENCOUNTER — Inpatient Hospital Stay: Payer: Medicare HMO | Admitting: Hematology and Oncology

## 2021-12-24 ENCOUNTER — Encounter: Payer: Self-pay | Admitting: *Deleted

## 2021-12-24 ENCOUNTER — Inpatient Hospital Stay: Payer: Medicare HMO

## 2021-12-24 ENCOUNTER — Other Ambulatory Visit: Payer: Self-pay

## 2021-12-24 DIAGNOSIS — Z171 Estrogen receptor negative status [ER-]: Secondary | ICD-10-CM | POA: Diagnosis not present

## 2021-12-24 DIAGNOSIS — C50411 Malignant neoplasm of upper-outer quadrant of right female breast: Secondary | ICD-10-CM | POA: Diagnosis not present

## 2021-12-24 DIAGNOSIS — I10 Essential (primary) hypertension: Secondary | ICD-10-CM

## 2021-12-24 DIAGNOSIS — E876 Hypokalemia: Secondary | ICD-10-CM

## 2021-12-24 DIAGNOSIS — Z95828 Presence of other vascular implants and grafts: Secondary | ICD-10-CM

## 2021-12-24 DIAGNOSIS — Z5112 Encounter for antineoplastic immunotherapy: Secondary | ICD-10-CM | POA: Diagnosis not present

## 2021-12-24 LAB — CBC WITH DIFFERENTIAL (CANCER CENTER ONLY)
Abs Immature Granulocytes: 0.01 10*3/uL (ref 0.00–0.07)
Basophils Absolute: 0 10*3/uL (ref 0.0–0.1)
Basophils Relative: 1 %
Eosinophils Absolute: 0 10*3/uL (ref 0.0–0.5)
Eosinophils Relative: 1 %
HCT: 26.3 % — ABNORMAL LOW (ref 36.0–46.0)
Hemoglobin: 8.7 g/dL — ABNORMAL LOW (ref 12.0–15.0)
Immature Granulocytes: 0 %
Lymphocytes Relative: 42 %
Lymphs Abs: 2.3 10*3/uL (ref 0.7–4.0)
MCH: 25.8 pg — ABNORMAL LOW (ref 26.0–34.0)
MCHC: 33.1 g/dL (ref 30.0–36.0)
MCV: 78 fL — ABNORMAL LOW (ref 80.0–100.0)
Monocytes Absolute: 0.6 10*3/uL (ref 0.1–1.0)
Monocytes Relative: 11 %
Neutro Abs: 2.5 10*3/uL (ref 1.7–7.7)
Neutrophils Relative %: 45 %
Platelet Count: 352 10*3/uL (ref 150–400)
RBC: 3.37 MIL/uL — ABNORMAL LOW (ref 3.87–5.11)
RDW: 18.4 % — ABNORMAL HIGH (ref 11.5–15.5)
Smear Review: NORMAL
WBC Count: 5.5 10*3/uL (ref 4.0–10.5)
nRBC: 0 % (ref 0.0–0.2)

## 2021-12-24 LAB — CMP (CANCER CENTER ONLY)
ALT: 5 U/L (ref 0–44)
AST: 9 U/L — ABNORMAL LOW (ref 15–41)
Albumin: 3.3 g/dL — ABNORMAL LOW (ref 3.5–5.0)
Alkaline Phosphatase: 81 U/L (ref 38–126)
Anion gap: 8 (ref 5–15)
BUN: 8 mg/dL (ref 8–23)
CO2: 30 mmol/L (ref 22–32)
Calcium: 8.2 mg/dL — ABNORMAL LOW (ref 8.9–10.3)
Chloride: 104 mmol/L (ref 98–111)
Creatinine: 0.5 mg/dL (ref 0.44–1.00)
GFR, Estimated: >60 mL/min (ref >60)
Glucose, Bld: 88 mg/dL (ref 70–99)
Potassium: 2.7 mmol/L — CL (ref 3.5–5.1)
Sodium: 142 mmol/L (ref 135–145)
Total Bilirubin: 0.4 mg/dL (ref 0.3–1.2)
Total Protein: 6.3 g/dL — ABNORMAL LOW (ref 6.5–8.1)

## 2021-12-24 MED ORDER — SODIUM CHLORIDE 0.9 % IV SOLN
420.0000 mg | Freq: Once | INTRAVENOUS | Status: AC
  Administered 2021-12-24: 420 mg via INTRAVENOUS
  Filled 2021-12-24: qty 14

## 2021-12-24 MED ORDER — SODIUM CHLORIDE 0.9 % IV SOLN
10.0000 mg | Freq: Once | INTRAVENOUS | Status: AC
  Administered 2021-12-24: 10 mg via INTRAVENOUS
  Filled 2021-12-24: qty 10

## 2021-12-24 MED ORDER — PALONOSETRON HCL INJECTION 0.25 MG/5ML
0.2500 mg | Freq: Once | INTRAVENOUS | Status: AC
  Administered 2021-12-24: 0.25 mg via INTRAVENOUS
  Filled 2021-12-24: qty 5

## 2021-12-24 MED ORDER — SODIUM CHLORIDE 0.9 % IV SOLN: Freq: Once | INTRAVENOUS | Status: AC

## 2021-12-24 MED ORDER — SODIUM CHLORIDE 0.9% FLUSH
10.0000 mL | Freq: Once | INTRAVENOUS | Status: AC
  Administered 2021-12-24: 10 mL

## 2021-12-24 MED ORDER — CLONIDINE HCL 0.1 MG PO TABS
0.1000 mg | ORAL_TABLET | Freq: Once | ORAL | Status: AC
  Administered 2021-12-24: 0.1 mg via ORAL
  Filled 2021-12-24: qty 1

## 2021-12-24 MED ORDER — SODIUM CHLORIDE 0.9 % IV SOLN
460.0000 mg | Freq: Once | INTRAVENOUS | Status: AC
  Administered 2021-12-24: 460 mg via INTRAVENOUS
  Filled 2021-12-24: qty 46

## 2021-12-24 MED ORDER — TRASTUZUMAB-ANNS CHEMO 150 MG IV SOLR
6.0000 mg/kg | Freq: Once | INTRAVENOUS | Status: AC
  Administered 2021-12-24: 336 mg via INTRAVENOUS
  Filled 2021-12-24: qty 16

## 2021-12-24 MED ORDER — ACETAMINOPHEN 325 MG PO TABS
650.0000 mg | ORAL_TABLET | Freq: Once | ORAL | Status: AC
  Administered 2021-12-24: 650 mg via ORAL
  Filled 2021-12-24: qty 2

## 2021-12-24 MED ORDER — DIPHENHYDRAMINE HCL 25 MG PO CAPS
50.0000 mg | ORAL_CAPSULE | Freq: Once | ORAL | Status: AC
  Administered 2021-12-24: 50 mg via ORAL
  Filled 2021-12-24: qty 2

## 2021-12-24 MED ORDER — SODIUM CHLORIDE 0.9 % IV SOLN
75.0000 mg/m2 | Freq: Once | INTRAVENOUS | Status: AC
  Administered 2021-12-24: 120 mg via INTRAVENOUS
  Filled 2021-12-24: qty 12

## 2021-12-24 MED ORDER — SODIUM CHLORIDE 0.9% FLUSH
10.0000 mL | INTRAVENOUS | Status: DC | PRN
  Administered 2021-12-24: 10 mL

## 2021-12-24 MED ORDER — POTASSIUM CHLORIDE CRYS ER 20 MEQ PO TBCR
40.0000 meq | EXTENDED_RELEASE_TABLET | Freq: Once | ORAL | Status: AC
  Administered 2021-12-24: 40 meq via ORAL
  Filled 2021-12-24: qty 2

## 2021-12-24 MED ORDER — HEPARIN SOD (PORK) LOCK FLUSH 100 UNIT/ML IV SOLN
500.0000 [IU] | Freq: Once | INTRAVENOUS | Status: AC | PRN
  Administered 2021-12-24: 500 [IU]

## 2021-12-24 MED ORDER — SODIUM CHLORIDE 0.9 % IV SOLN
150.0000 mg | Freq: Once | INTRAVENOUS | Status: AC
  Administered 2021-12-24: 150 mg via INTRAVENOUS
  Filled 2021-12-24: qty 150

## 2021-12-24 NOTE — Assessment & Plan Note (Addendum)
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 Neoadjuvant chemotherapy withTCHP 2. Followed by breast conserving surgery with sentinel lymph node study vs targeted axillary dissection 3. 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 We will proceed with the treatment because of benefits of anti-HER2 therapy or tremendous)  Chemo Toxicities: Denies any diarrhea or nausea or vomiting. 1. Decreased appetite and weight loss: She was given dexamethasone 4 mg and she started eating very well and picked up on her weight.  I instructed her to stop the steroids at this time. 2. Chemo induced anemia: Hemoglobin is 9.2.  It is microcytic in nature.  Ferritin 218, iron saturation 24%, TIBC 288.  There is no iron deficiency.  Therefore there could be some underlying beta thalassemia.  Most likely cause of anemia is chemo induced bone marrow suppression.  Denies peripheral neuropathy. We will plan for breast MRI and surgery follow-up soon after RTC in 3 weeks for cycle 6

## 2021-12-24 NOTE — Patient Instructions (Signed)
[facility] ONCOLOG[facility]  Discharge Instructions: Thank you for choosing [facility] to provide your oncology and hematology care.   If you have a lab appointment with the [facility], please go directly to the [facility] and check in at the registration area.   Wear comfortable clothing and clothing appropriate for easy access to any Portacath or PICC line.   We strive to give you quality time with your provider. You may need to reschedule your appointment if you arrive late (15 or more minutes).  Arriving late affects you and other patients whose appointments are after yours.  Also, if you miss three or more appointments without notifying the office, you may be dismissed from the clinic at the providers discretion.      For prescription refill requests, have your pharmacy contact our office and allow 72 hours for refills to be completed.    Today you received the following chemotherapy and/or immunotherapy agents: Kanjinti, Perjeta, Docetaxel, & Carboplatin    To help prevent nausea and vomiting after your treatment, we encourage you to take your nausea medication as directed.  BELOW ARE SYMPTOMS THAT SHOULD BE REPORTED IMMEDIATELY: *FEVER GREATER THAN 100.4 F (38 C) OR HIGHER *CHILLS OR SWEATING *NAUSEA AND VOMITING THAT IS NOT CONTROLLED WITH YOUR NAUSEA MEDICATION *UNUSUAL SHORTNESS OF BREATH *UNUSUAL BRUISING OR BLEEDING *URINARY PROBLEMS (pain or burning when urinating, or frequent urination) *BOWEL PROBLEMS (unusual diarrhea, constipation, pain near the anus) TENDERNESS IN MOUTH AND THROAT WITH OR WITHOUT PRESENCE OF ULCERS (sore throat, sores in mouth, or a toothache) UNUSUAL RASH, SWELLING OR PAIN  UNUSUAL VAGINAL DISCHARGE OR ITCHING   Items with * indicate a potential emergency and should be followed up as soon as possible or go to the Emergency Department if any problems should occur.  Please show the CHEMOTHERAPY ALERT CARD or  IMMUNOTHERAPY ALERT CARD at check-in to the Emergency Department and triage nurse.  Should you have questions after your visit or need to cancel or reschedule your appointment, please contact [facility]  Dept: [phone]  and follow the prompts.  Office hours are 8:00 a.m. to 4:30 p.m. Monday - Friday. Please note that voicemails left after 4:00 p.m. may not be returned until the following business day.  We are closed weekends and major holidays. You have access to a nurse at all times for urgent questions. Please call the main number to the clinic Dept: [phone] and follow the prompts.   For any non-urgent questions, you may also contact your provider using MyChart. We now offer e-Visits for anyone 10 and older to request care online for non-urgent symptoms. For details visit [email or web address].   Also download the MyChart app! Go to the app store, search "MyChart", open the app, select [facility], and log in with your MyChart username and password.  Due to Covid, a mask is required upon entering the hospital/clinic. If you do not have a mask, one will be given to you upon arrival. For doctor visits, patients may have 1 support person aged 13 or older with them. For treatment visits, patients cannot have anyone with them due to current Covid guidelines and our immunocompromised population.

## 2021-12-24 NOTE — Progress Notes (Signed)
OK to trt today w/ elevated BP today per Dr. Lindi Adie.  RN received a v/o from Dr. Lindi Adie for 0.1 mg of Clonidine PO to be administered in infusion today.

## 2021-12-24 NOTE — Progress Notes (Signed)
CRITICAL VALUE STICKER  CRITICAL VALUE: K 2.7  RECEIVER (on-site recipient of call): Merleen Nicely, RN  DATE & TIME NOTIFIED: 12/24/21 at 48  MD NOTIFIED: Nicholas Lose, MD  [place]: 12/24/21 at 1054  RESPONSE:  MD notified, verbalized understanding. Okay to proceed with Tx today.  Pt states she will start taking home K that was ordered in December.  MD also requesting pt to receive Potassium 40 mEq p.o today during tx. Orders placed.

## 2021-12-27 ENCOUNTER — Other Ambulatory Visit: Payer: Self-pay

## 2021-12-27 ENCOUNTER — Inpatient Hospital Stay: Payer: Medicare HMO

## 2021-12-27 ENCOUNTER — Telehealth (HOSPITAL_COMMUNITY): Payer: Self-pay | Admitting: Licensed Clinical Social Worker

## 2021-12-27 VITALS — BP 168/92 | HR 96 | Temp 98.4°F | Resp 18

## 2021-12-27 DIAGNOSIS — Z171 Estrogen receptor negative status [ER-]: Secondary | ICD-10-CM

## 2021-12-27 DIAGNOSIS — C50411 Malignant neoplasm of upper-outer quadrant of right female breast: Secondary | ICD-10-CM

## 2021-12-27 DIAGNOSIS — Z5112 Encounter for antineoplastic immunotherapy: Secondary | ICD-10-CM | POA: Diagnosis not present

## 2021-12-27 MED ORDER — PEGFILGRASTIM-CBQV 6 MG/0.6ML ~~LOC~~ SOSY
6.0000 mg | PREFILLED_SYRINGE | Freq: Once | SUBCUTANEOUS | Status: AC
  Administered 2021-12-27: 6 mg via SUBCUTANEOUS
  Filled 2021-12-27: qty 0.6

## 2021-12-27 NOTE — Progress Notes (Signed)
Pt came in for Udenyca injection with blood pressure at 168/92, asymptomatic and has already taken her blood pressure medications this morning. Dr. Lindi Adie is aware and gave the okay to proceed with injection today.

## 2021-12-27 NOTE — Telephone Encounter (Signed)
CSW called pt to remind of [facility] this coming Wednesday at 10am- unable to reach- left VM with details of meeting  Jorge Ny, [facility] Desk#: [phone] Cell#: [phone]

## 2021-12-28 ENCOUNTER — Telehealth: Payer: Self-pay | Admitting: Hematology and Oncology

## 2021-12-28 ENCOUNTER — Encounter: Payer: Self-pay | Admitting: *Deleted

## 2021-12-28 NOTE — Telephone Encounter (Signed)
Left message with follow-up appointments per 1/27 los. °

## 2022-01-13 MED FILL — Fosaprepitant Dimeglumine For IV Infusion 150 MG (Base Eq): INTRAVENOUS | Qty: 5 | Status: AC

## 2022-01-13 MED FILL — Dexamethasone Sodium Phosphate Inj 100 MG/10ML: INTRAMUSCULAR | Qty: 1 | Status: AC

## 2022-01-13 NOTE — Progress Notes (Addendum)
Patient Care Team: Pcp, No as PCP - General Nicholas Lose, MD as Consulting Physician (Hematology and Oncology) Rockwell Germany, RN as Oncology Nurse Navigator Mauro Kaufmann, RN as Oncology Nurse Navigator  DIAGNOSIS:    ICD-10-CM   1. Malignant neoplasm of upper-outer quadrant of right breast in female, estrogen receptor negative ([place])  C50.411    Z17.1       SUMMARY OF ONCOLOGIC HISTORY: Oncology History  Malignant neoplasm of upper-outer quadrant of right breast in female, estrogen receptor negative ([place])  09/10/2021 Initial Diagnosis   Right breast thickening and swelling: Mammogram revealed extremely hard right breast with peau d'orange skin changes, ultrasound revealed very large irregular mass measuring 11 cm with diffuse skin thickening, 3 abnormal right axillary lymph nodes, biopsy revealed grade 3 invasive mammary cancer with pleomorphic features, lymph node positive, ER 0%, PR 0%, HER2 3+, Ki-67 30%   09/16/2021 Cancer Staging   Staging form: Breast, AJCC 8th Edition - Clinical stage from 09/16/2021: Stage IIIB (cT4d, cN1, cM0, G3, ER-, PR-, HER2+) - Signed by Nicholas Lose, MD on 09/16/2021 Stage prefix: Initial diagnosis Histologic grading system: 3 grade system    09/24/2021 -  Chemotherapy   Patient is on Treatment Plan : BREAST  Docetaxel + Carboplatin + Trastuzumab + Pertuzumab  (TCHP) q21d        CHIEF COMPLIANT: Cycle 6 TCHP  INTERVAL HISTORY: Tamre Cass is a 66 y.o. with above-mentioned history of right breast cancer, currently on chemotherapy with TCHP. She presents to the clinic today for treatment.   ALLERGIES:  has No Known Allergies.  MEDICATIONS:  Current Outpatient Medications  Medication Sig Dispense Refill   amLODipine (NORVASC) 5 MG tablet Take 1 tablet (5 mg total) by mouth daily. 90 tablet 3   atorvastatin (LIPITOR) 40 MG tablet Take 40 mg by mouth daily.     carvedilol (COREG) 6.25 MG tablet Take 1 tablet (6.25 mg total) by mouth 2  (two) times daily. 60 tablet 3   dexamethasone (DECADRON) 4 MG tablet Take 1 tablet (4 mg total) by mouth daily. Take 1 tab day before chemo and 1 tab day after chemo with food 30 tablet 1   lidocaine-prilocaine (EMLA) cream Apply to affected area once 30 g 3   losartan (COZAAR) 100 MG tablet Take 1 tablet (100 mg total) by mouth daily. 30 tablet 3   ondansetron (ZOFRAN) 8 MG tablet Take 1 tablet (8 mg total) by mouth 2 (two) times daily as needed (Nausea or vomiting). Start on the third day after chemotherapy. 30 tablet 1   potassium chloride (KLOR-CON) 20 MEQ packet Take 20 mEq by mouth daily. 30 packet 1   prochlorperazine (COMPAZINE) 10 MG tablet Take 1 tablet (10 mg total) by mouth every 6 (six) hours as needed (Nausea or vomiting). 30 tablet 1   spironolactone (ALDACTONE) 25 MG tablet Take 1 tablet (25 mg total) by mouth daily. 30 tablet 6   No current facility-administered medications for this visit.    PHYSICAL EXAMINATION: ECOG PERFORMANCE STATUS: 1 - Symptomatic but completely ambulatory  Vitals:   01/14/22 1028  BP: (!) 166/91  Pulse: 95  Resp: 17  Temp: 98.1 F (36.7 C)  SpO2: 92%   Filed Weights   01/14/22 1028  Weight: 112 lb 4.8 oz (50.9 kg)    LABORATORY DATA:  I have reviewed the data as listed CMP Latest Ref Rng & Units 01/14/2022 12/24/2021 12/03/2021  Glucose 70 - 99 mg/dL 103(H) 88 109(H)  BUN 8 - 23 mg/dL 15 8 19   Creatinine 0.44 - 1.00 mg/dL 0.54 0.50 0.57  Sodium 135 - 145 mmol/L 139 142 140  Potassium 3.5 - 5.1 mmol/L 3.3(L) 2.7(LL) 3.2(L)  Chloride 98 - 111 mmol/L 105 104 106  CO2 22 - 32 mmol/L 26 30 27   Calcium 8.9 - 10.3 mg/dL 8.8(L) 8.2(L) 8.6(L)  Total Protein 6.5 - 8.1 g/dL 6.7 6.3(L) 6.1(L)  Total Bilirubin 0.3 - 1.2 mg/dL 0.5 0.4 0.3  Alkaline Phos 38 - 126 U/L 83 81 79  AST 15 - 41 U/L 8(L) 9(L) 11(L)  ALT 0 - 44 U/L 5 5 8     Lab Results  Component Value Date   WBC 9.5 01/14/2022   HGB 8.7 (L) 01/14/2022   HCT 26.5 (L) 01/14/2022    MCV 78.4 (L) 01/14/2022   PLT 324 01/14/2022   NEUTROABS 7.0 01/14/2022    ASSESSMENT & PLAN:  Malignant neoplasm of upper-outer quadrant of right breast in female, estrogen receptor negative ([place]) 09/10/2021 right breast thickening and swelling: Mammogram revealed extremely hard right breast with peau d'orange skin changes, ultrasound revealed very large irregular mass measuring 11 cm with diffuse skin thickening, 3 abnormal right axillary lymph nodes, biopsy revealed grade 3 invasive mammary cancer with pleomorphic features, lymph node positive, ER 0%, PR 0%, HER2 3+, Ki-67 30% Stage IIIb   Treatment plan: 1. Neoadjuvant chemotherapy with TCHP 2. Followed by mastectomy with targeted axillary dissection 3. 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  We will proceed with the treatment because of benefits of anti-HER2 therapy or tremendous)   Chemo Toxicities: Denies any diarrhea or nausea or vomiting. 1. Decreased appetite and weight loss  2. Chemo induced anemia: Hemoglobin is 8.7  It is microcytic in nature.  Ferritin 218, iron saturation 24%, TIBC 288.   Most likely cause of anemia is chemo induced bone marrow suppression.   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  Breast MRI scheduled for 01/17/2022 She will come back in 3 weeks for Herceptin Perjeta maintenance. I will see her after surgery to discuss the final pathology report.    No orders of the defined  types were placed in this encounter.  The patient has a good understanding of the overall plan. she agrees with it. she will call with any problems that may develop before the next visit here.  Total time spent: 30 mins including face to face time and time spent for planning, charting and coordination of care  Rulon Eisenmenger, MD, MPH 01/14/2022  I, Thana Ates, am acting as scribe for Dr. Nicholas Lose.  I have reviewed the above documentation for accuracy and completeness, and I agree with the above.

## 2022-01-14 ENCOUNTER — Encounter: Payer: Self-pay | Admitting: *Deleted

## 2022-01-14 ENCOUNTER — Other Ambulatory Visit: Payer: Self-pay

## 2022-01-14 ENCOUNTER — Inpatient Hospital Stay (HOSPITAL_BASED_OUTPATIENT_CLINIC_OR_DEPARTMENT_OTHER): Payer: Medicare HMO | Admitting: Hematology and Oncology

## 2022-01-14 ENCOUNTER — Inpatient Hospital Stay: Payer: Medicare HMO | Attending: Hematology and Oncology

## 2022-01-14 ENCOUNTER — Inpatient Hospital Stay: Payer: Medicare HMO

## 2022-01-14 VITALS — BP 163/89 | HR 78 | Resp 17

## 2022-01-14 DIAGNOSIS — C50411 Malignant neoplasm of upper-outer quadrant of right female breast: Secondary | ICD-10-CM | POA: Diagnosis not present

## 2022-01-14 DIAGNOSIS — D6481 Anemia due to antineoplastic chemotherapy: Secondary | ICD-10-CM | POA: Insufficient documentation

## 2022-01-14 DIAGNOSIS — Z5189 Encounter for other specified aftercare: Secondary | ICD-10-CM | POA: Diagnosis not present

## 2022-01-14 DIAGNOSIS — C773 Secondary and unspecified malignant neoplasm of axilla and upper limb lymph nodes: Secondary | ICD-10-CM | POA: Diagnosis not present

## 2022-01-14 DIAGNOSIS — Z171 Estrogen receptor negative status [ER-]: Secondary | ICD-10-CM

## 2022-01-14 DIAGNOSIS — Z5111 Encounter for antineoplastic chemotherapy: Secondary | ICD-10-CM | POA: Insufficient documentation

## 2022-01-14 DIAGNOSIS — Z5112 Encounter for antineoplastic immunotherapy: Secondary | ICD-10-CM | POA: Diagnosis not present

## 2022-01-14 DIAGNOSIS — Z95828 Presence of other vascular implants and grafts: Secondary | ICD-10-CM

## 2022-01-14 LAB — CBC WITH DIFFERENTIAL (CANCER CENTER ONLY)
Abs Immature Granulocytes: 0.04 10*3/uL (ref 0.00–0.07)
Basophils Absolute: 0 10*3/uL (ref 0.0–0.1)
Basophils Relative: 0 %
Eosinophils Absolute: 0 10*3/uL (ref 0.0–0.5)
Eosinophils Relative: 0 %
HCT: 26.5 % — ABNORMAL LOW (ref 36.0–46.0)
Hemoglobin: 8.7 g/dL — ABNORMAL LOW (ref 12.0–15.0)
Immature Granulocytes: 0 %
Lymphocytes Relative: 17 %
Lymphs Abs: 1.6 10*3/uL (ref 0.7–4.0)
MCH: 25.7 pg — ABNORMAL LOW (ref 26.0–34.0)
MCHC: 32.8 g/dL (ref 30.0–36.0)
MCV: 78.4 fL — ABNORMAL LOW (ref 80.0–100.0)
Monocytes Absolute: 0.8 10*3/uL (ref 0.1–1.0)
Monocytes Relative: 8 %
Neutro Abs: 7 10*3/uL (ref 1.7–7.7)
Neutrophils Relative %: 75 %
Platelet Count: 324 10*3/uL (ref 150–400)
RBC: 3.38 MIL/uL — ABNORMAL LOW (ref 3.87–5.11)
RDW: 18.7 % — ABNORMAL HIGH (ref 11.5–15.5)
WBC Count: 9.5 10*3/uL (ref 4.0–10.5)
nRBC: 0 % (ref 0.0–0.2)

## 2022-01-14 LAB — CMP (CANCER CENTER ONLY)
ALT: 5 U/L (ref 0–44)
AST: 8 U/L — ABNORMAL LOW (ref 15–41)
Albumin: 3.6 g/dL (ref 3.5–5.0)
Alkaline Phosphatase: 83 U/L (ref 38–126)
Anion gap: 8 (ref 5–15)
BUN: 15 mg/dL (ref 8–23)
CO2: 26 mmol/L (ref 22–32)
Calcium: 8.8 mg/dL — ABNORMAL LOW (ref 8.9–10.3)
Chloride: 105 mmol/L (ref 98–111)
Creatinine: 0.54 mg/dL (ref 0.44–1.00)
GFR, Estimated: >60 mL/min (ref >60)
Glucose, Bld: 103 mg/dL — ABNORMAL HIGH (ref 70–99)
Potassium: 3.3 mmol/L — ABNORMAL LOW (ref 3.5–5.1)
Sodium: 139 mmol/L (ref 135–145)
Total Bilirubin: 0.5 mg/dL (ref 0.3–1.2)
Total Protein: 6.7 g/dL (ref 6.5–8.1)

## 2022-01-14 MED ORDER — TRASTUZUMAB-ANNS CHEMO 150 MG IV SOLR
6.0000 mg/kg | Freq: Once | INTRAVENOUS | Status: AC
  Administered 2022-01-14: 336 mg via INTRAVENOUS
  Filled 2022-01-14: qty 16

## 2022-01-14 MED ORDER — PALONOSETRON HCL INJECTION 0.25 MG/5ML
0.2500 mg | Freq: Once | INTRAVENOUS | Status: AC
  Administered 2022-01-14: 0.25 mg via INTRAVENOUS
  Filled 2022-01-14: qty 5

## 2022-01-14 MED ORDER — HEPARIN SOD (PORK) LOCK FLUSH 100 UNIT/ML IV SOLN
500.0000 [IU] | Freq: Once | INTRAVENOUS | Status: AC | PRN
  Administered 2022-01-14: 500 [IU]

## 2022-01-14 MED ORDER — SODIUM CHLORIDE 0.9 % IV SOLN
75.0000 mg/m2 | Freq: Once | INTRAVENOUS | Status: AC
  Administered 2022-01-14: 120 mg via INTRAVENOUS
  Filled 2022-01-14: qty 12

## 2022-01-14 MED ORDER — SODIUM CHLORIDE 0.9 % IV SOLN: Freq: Once | INTRAVENOUS | Status: AC

## 2022-01-14 MED ORDER — SODIUM CHLORIDE 0.9 % IV SOLN
10.0000 mg | Freq: Once | INTRAVENOUS | Status: AC
  Administered 2022-01-14: 10 mg via INTRAVENOUS
  Filled 2022-01-14: qty 10

## 2022-01-14 MED ORDER — DIPHENHYDRAMINE HCL 25 MG PO CAPS
50.0000 mg | ORAL_CAPSULE | Freq: Once | ORAL | Status: AC
  Administered 2022-01-14: 50 mg via ORAL
  Filled 2022-01-14: qty 2

## 2022-01-14 MED ORDER — SODIUM CHLORIDE 0.9% FLUSH
10.0000 mL | Freq: Once | INTRAVENOUS | Status: AC
  Administered 2022-01-14: 10 mL

## 2022-01-14 MED ORDER — ACETAMINOPHEN 325 MG PO TABS
650.0000 mg | ORAL_TABLET | Freq: Once | ORAL | Status: AC
  Administered 2022-01-14: 650 mg via ORAL
  Filled 2022-01-14: qty 2

## 2022-01-14 MED ORDER — SODIUM CHLORIDE 0.9 % IV SOLN
150.0000 mg | Freq: Once | INTRAVENOUS | Status: AC
  Administered 2022-01-14: 150 mg via INTRAVENOUS
  Filled 2022-01-14: qty 150

## 2022-01-14 MED ORDER — SODIUM CHLORIDE 0.9 % IV SOLN
420.0000 mg | Freq: Once | INTRAVENOUS | Status: AC
  Administered 2022-01-14: 420 mg via INTRAVENOUS
  Filled 2022-01-14: qty 14

## 2022-01-14 MED ORDER — SODIUM CHLORIDE 0.9% FLUSH
10.0000 mL | INTRAVENOUS | Status: DC | PRN
  Administered 2022-01-14: 10 mL

## 2022-01-14 MED ORDER — SODIUM CHLORIDE 0.9 % IV SOLN
456.0000 mg | Freq: Once | INTRAVENOUS | Status: AC
  Administered 2022-01-14: 460 mg via INTRAVENOUS
  Filled 2022-01-14: qty 46

## 2022-01-14 NOTE — Assessment & Plan Note (Signed)
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 Neoadjuvant chemotherapy withTCHP 2. Followed by breast conserving surgery with sentinel lymph node study vs targeted axillary dissection 3. 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 We will proceed with the treatment because of benefits of anti-HER2 therapy or tremendous)  Chemo Toxicities: Denies any diarrhea or nausea or vomiting. 1. Decreased appetite and weight loss  2. Chemo induced anemia:Hemoglobin is 8.7 It is microcytic in nature.  Ferritin 218, iron saturation 24%, TIBC 288.   Most likely cause of anemia is chemo induced bone marrow suppression.  Denies peripheral neuropathy. Breast MRI scheduled for 01/17/2022 She will come back in 3 weeks for Herceptin Perjeta maintenance. I will see her after surgery to discuss the final pathology report.

## 2022-01-14 NOTE — Patient Instructions (Signed)
[facility]COL[facility]  Discharge Instructions: Thank you for choosing [facility] to provide your oncology and hematology care.   If you have a lab appointment with the [facility], please go directly to the [facility] and check in at the registration area.   Wear comfortable clothing and clothing appropriate for easy access to any Portacath or PICC line.   We strive to give you quality time with your provider. You may need to reschedule your appointment if you arrive late (15 or more minutes).  Arriving late affects you and other patients whose appointments are after yours.  Also, if you miss three or more appointments without notifying the office, you may be dismissed from the clinic at the providers discretion.      For prescription refill requests, have your pharmacy contact our office and allow 72 hours for refills to be completed.    Today you received the following chemotherapy and/or immunotherapy agents: Kanjinti/Perjeta/Docetaxel/Carboplatin   To help prevent nausea and vomiting after your treatment, we encourage you to take your nausea medication as directed.  BELOW ARE SYMPTOMS THAT SHOULD BE REPORTED IMMEDIATELY: *FEVER GREATER THAN 100.4 F (38 C) OR HIGHER *CHILLS OR SWEATING *NAUSEA AND VOMITING THAT IS NOT CONTROLLED WITH YOUR NAUSEA MEDICATION *UNUSUAL SHORTNESS OF BREATH *UNUSUAL BRUISING OR BLEEDING *URINARY PROBLEMS (pain or burning when urinating, or frequent urination) *BOWEL PROBLEMS (unusual diarrhea, constipation, pain near the anus) TENDERNESS IN MOUTH AND THROAT WITH OR WITHOUT PRESENCE OF ULCERS (sore throat, sores in mouth, or a toothache) UNUSUAL RASH, SWELLING OR PAIN  UNUSUAL VAGINAL DISCHARGE OR ITCHING   Items with * indicate a potential emergency and should be followed up as soon as possible or go to the Emergency Department if any problems should occur.  Please show the CHEMOTHERAPY ALERT CARD or IMMUNOTHERAPY  ALERT CARD at check-in to the Emergency Department and triage nurse.  Should you have questions after your visit or need to cancel or reschedule your appointment, please contact [facility]  Dept: [phone]  and follow the prompts.  Office hours are 8:00 a.m. to 4:30 p.m. Monday - Friday. Please note that voicemails left after 4:00 p.m. may not be returned until the following business day.  We are closed weekends and major holidays. You have access to a nurse at all times for urgent questions. Please call the main number to the clinic Dept: [phone] and follow the prompts.   For any non-urgent questions, you may also contact your provider using MyChart. We now offer e-Visits for anyone 75 and older to request care online for non-urgent symptoms. For details visit [email or web address].   Also download the MyChart app! Go to the app store, search "MyChart", open the app, select [facility], and log in with your MyChart username and password.  Due to Covid, a mask is required upon entering the hospital/clinic. If you do not have a mask, one will be given to you upon arrival. For doctor visits, patients may have 1 support person aged 86 or older with them. For treatment visits, patients cannot have anyone with them due to current Covid guidelines and our immunocompromised population.

## 2022-01-17 ENCOUNTER — Ambulatory Visit
Admission: RE | Admit: 2022-01-17 | Discharge: 2022-01-17 | Disposition: A | Discharge status: Home / Self Care | Payer: Medicare HMO | Source: Ambulatory Visit | Attending: Hematology and Oncology | Admitting: Hematology and Oncology

## 2022-01-17 ENCOUNTER — Other Ambulatory Visit: Payer: Self-pay

## 2022-01-17 ENCOUNTER — Inpatient Hospital Stay: Payer: Medicare HMO

## 2022-01-17 VITALS — BP 162/92 | HR 87 | Temp 98.3°F | Resp 18

## 2022-01-17 DIAGNOSIS — C50411 Malignant neoplasm of upper-outer quadrant of right female breast: Secondary | ICD-10-CM

## 2022-01-17 DIAGNOSIS — Z5112 Encounter for antineoplastic immunotherapy: Secondary | ICD-10-CM | POA: Diagnosis not present

## 2022-01-17 DIAGNOSIS — Z171 Estrogen receptor negative status [ER-]: Secondary | ICD-10-CM

## 2022-01-17 MED ORDER — GADOBUTROL 1 MMOL/ML IV SOLN
5.0000 mL | Freq: Once | INTRAVENOUS | Status: AC | PRN
  Administered 2022-01-17: 5 mL via INTRAVENOUS

## 2022-01-17 MED ORDER — PEGFILGRASTIM-CBQV 6 MG/0.6ML ~~LOC~~ SOSY
6.0000 mg | PREFILLED_SYRINGE | Freq: Once | SUBCUTANEOUS | Status: AC
  Administered 2022-01-17: 6 mg via SUBCUTANEOUS
  Filled 2022-01-17: qty 0.6

## 2022-01-17 NOTE — Progress Notes (Signed)
Pt is here for Udenyca injection with blood pressure of 162/92. She took her blood pressure medication this morning and is asymptomatic. Dr. Lindi Adie is aware and gave the okay to proceed with injection.

## 2022-01-18 ENCOUNTER — Encounter: Payer: Self-pay | Admitting: *Deleted

## 2022-01-25 ENCOUNTER — Other Ambulatory Visit: Payer: Self-pay | Admitting: Surgery

## 2022-01-28 ENCOUNTER — Telehealth: Payer: Self-pay | Admitting: *Deleted

## 2022-01-28 ENCOUNTER — Encounter: Payer: Self-pay | Admitting: *Deleted

## 2022-01-28 NOTE — Telephone Encounter (Signed)
Attempted to call patient but VM was full and I was unable leave message.  ? ?Was able to get in touch with her daughter Lynn Mcgee and she is going to call Dr. Trevor Mace office to get an appt. And will make sure she gets to the appt. ?

## 2022-02-01 ENCOUNTER — Other Ambulatory Visit: Payer: Self-pay | Admitting: Surgery

## 2022-02-03 ENCOUNTER — Encounter: Payer: Self-pay | Admitting: *Deleted

## 2022-02-03 ENCOUNTER — Other Ambulatory Visit: Payer: Self-pay | Admitting: *Deleted

## 2022-02-03 DIAGNOSIS — Z171 Estrogen receptor negative status [ER-]: Secondary | ICD-10-CM

## 2022-02-04 ENCOUNTER — Other Ambulatory Visit: Payer: Self-pay | Admitting: Pharmacist

## 2022-02-04 ENCOUNTER — Inpatient Hospital Stay: Payer: Medicare HMO | Attending: Hematology and Oncology

## 2022-02-04 ENCOUNTER — Other Ambulatory Visit: Payer: Self-pay

## 2022-02-04 VITALS — BP 175/91 | HR 83 | Temp 98.5°F | Resp 18 | Wt 111.5 lb

## 2022-02-04 DIAGNOSIS — Z5112 Encounter for antineoplastic immunotherapy: Secondary | ICD-10-CM | POA: Insufficient documentation

## 2022-02-04 DIAGNOSIS — C50411 Malignant neoplasm of upper-outer quadrant of right female breast: Secondary | ICD-10-CM | POA: Insufficient documentation

## 2022-02-04 MED ORDER — SODIUM CHLORIDE 0.9% FLUSH
10.0000 mL | INTRAVENOUS | Status: DC | PRN
  Administered 2022-02-04: 10 mL

## 2022-02-04 MED ORDER — SODIUM CHLORIDE 0.9 % IV SOLN: Freq: Once | INTRAVENOUS | Status: AC

## 2022-02-04 MED ORDER — DIPHENHYDRAMINE HCL 25 MG PO CAPS
50.0000 mg | ORAL_CAPSULE | Freq: Once | ORAL | Status: AC
  Administered 2022-02-04: 50 mg via ORAL
  Filled 2022-02-04: qty 2

## 2022-02-04 MED ORDER — ACETAMINOPHEN 325 MG PO TABS
650.0000 mg | ORAL_TABLET | Freq: Once | ORAL | Status: AC
  Administered 2022-02-04: 650 mg via ORAL
  Filled 2022-02-04: qty 2

## 2022-02-04 MED ORDER — TRASTUZUMAB-ANNS CHEMO 150 MG IV SOLR
6.0000 mg/kg | Freq: Once | INTRAVENOUS | Status: AC
  Administered 2022-02-04: 315 mg via INTRAVENOUS
  Filled 2022-02-04: qty 15

## 2022-02-04 MED ORDER — HEPARIN SOD (PORK) LOCK FLUSH 100 UNIT/ML IV SOLN
500.0000 [IU] | Freq: Once | INTRAVENOUS | Status: AC | PRN
  Administered 2022-02-04: 500 [IU]

## 2022-02-04 MED ORDER — SODIUM CHLORIDE 0.9 % IV SOLN
420.0000 mg | Freq: Once | INTRAVENOUS | Status: AC
  Administered 2022-02-04: 420 mg via INTRAVENOUS
  Filled 2022-02-04: qty 14

## 2022-02-04 NOTE — Patient Instructions (Signed)
[facility]  Discharge Instructions: ?Thank you for choosing [facility] to provide your oncology and hematology care.  ? ?If you have a lab appointment with the [facility], please go directly to the [facility] and check in at the registration area. ?  ?Wear comfortable clothing and clothing appropriate for easy access to any Portacath or PICC line.  ? ?We strive to give you quality time with your provider. You may need to reschedule your appointment if you arrive late (15 or more minutes).  Arriving late affects you and other patients whose appointments are after yours.  Also, if you miss three or more appointments without notifying the office, you may be dismissed from the clinic at the provider?s discretion.    ?  ?For prescription refill requests, have your pharmacy contact our office and allow 72 hours for refills to be completed.   ? ?Today you received the following chemotherapy and/or immunotherapy agents: Herceptin/perjeta    ?  ?To help prevent nausea and vomiting after your treatment, we encourage you to take your nausea medication as directed. ? ?BELOW ARE SYMPTOMS THAT SHOULD BE REPORTED IMMEDIATELY: ?*FEVER GREATER THAN 100.4 F (38 ?C) OR HIGHER ?*CHILLS OR SWEATING ?*NAUSEA AND VOMITING THAT IS NOT CONTROLLED WITH YOUR NAUSEA MEDICATION ?*UNUSUAL SHORTNESS OF BREATH ?*UNUSUAL BRUISING OR BLEEDING ?*URINARY PROBLEMS (pain or burning when urinating, or frequent urination) ?*BOWEL PROBLEMS (unusual diarrhea, constipation, pain near the anus) ?TENDERNESS IN MOUTH AND THROAT WITH OR WITHOUT PRESENCE OF ULCERS (sore throat, sores in mouth, or a toothache) ?UNUSUAL RASH, SWELLING OR PAIN  ?UNUSUAL VAGINAL DISCHARGE OR ITCHING  ? ?Items with * indicate a potential emergency and should be followed up as soon as possible or go to the Emergency Department if any problems should occur. ? ?Please show the CHEMOTHERAPY ALERT CARD or IMMUNOTHERAPY ALERT CARD at  check-in to the Emergency Department and triage nurse. ? ?Should you have questions after your visit or need to cancel or reschedule your appointment, please contact [facility]  Dept: [phone]  and follow the prompts.  Office hours are 8:00 a.m. to 4:30 p.m. Monday - Friday. Please note that voicemails left after 4:00 p.m. may not be returned until the following business day.  We are closed weekends and major holidays. You have access to a nurse at all times for urgent questions. Please call the main number to the clinic Dept: [phone] and follow the prompts. ? ? ?For any non-urgent questions, you may also contact your provider using MyChart. We now offer e-Visits for anyone 63 and older to request care online for non-urgent symptoms. For details visit [email or web address]. ?  ?Also download the MyChart app! Go to the app store, search "MyChart", open the app, select [facility], and log in with your MyChart username and password. ? ?Due to Covid, a mask is required upon entering the hospital/clinic. If you do not have a mask, one will be given to you upon arrival. For doctor visits, patients may have 1 support person aged 44 or older with them. For treatment visits, patients cannot have anyone with them due to current Covid guidelines and our immunocompromised population.  ? ?

## 2022-02-04 NOTE — Progress Notes (Signed)
[facility]  ?     Telephone: [phone]?Fax: [phone]  ? ?Oncology Clinical Pharmacist Practitioner Progress Note ? ?Lynn Mcgee is a 66 y.o. female with a diagnosis of breast cancer currently on pertuzumab/trastuzumab under the care of Dr. Nicholas Lose.  ? ?Clinical pharmacy was contacted today by Dr. Lindi Adie asking to put in a pertuzumab/trastuzumab treatment plan.  [name] last received neoadjuvant TCHP on February 17 and she has breast surgery planned tentatively for March 28.  Last ECHO was done on December 13 which showed a LVEF estimated at 45-50%.  She is followed by Dr. Haroldine Laws and Dr. Lindi Adie is okay with proceeding today with treatment based on her last ECHO results. Next echo is planned for 02/28/22. Cycle 1, day 1 signed and nursing made aware. ? ?Clinical pharmacy will continue to support Paulette Blanch and Dr. Nicholas Lose as needed. ? ?Raina Mina, RPH-CPP,  ?02/04/2022  8:55 AM  ? ?

## 2022-02-04 NOTE — Progress Notes (Signed)
Patient declined 30 minute post infusion observation.  No s/s or c/o distress or discomfort.  VSS.   ?

## 2022-02-08 ENCOUNTER — Encounter: Payer: Self-pay | Admitting: Hematology and Oncology

## 2022-02-14 ENCOUNTER — Other Ambulatory Visit: Payer: Self-pay | Admitting: *Deleted

## 2022-02-14 ENCOUNTER — Encounter (HOSPITAL_COMMUNITY): Payer: Self-pay | Admitting: Internal Medicine

## 2022-02-14 ENCOUNTER — Encounter: Payer: Self-pay | Admitting: Hematology and Oncology

## 2022-02-14 MED ORDER — GABAPENTIN 100 MG PO CAPS: 100.0000 mg | ORAL_CAPSULE | Freq: Every day | ORAL | 0 refills | Status: DC

## 2022-02-14 NOTE — Progress Notes (Signed)
Surgical Instructions ? ? ? Your procedure is scheduled on 02/22/22. ? Report to [facility] Main Entrance "A" at 11:00 A.M., then check in with the Admitting office. ? Call this number if you have problems the morning of surgery: ? [phone] ? ? If you have any questions prior to your surgery date call [phone]: Open Monday-Friday 8am-4pm ? ? ? Remember: ? Do not eat after midnight the night before your surgery ? ?You may drink clear liquids until 10:00am the morning of your surgery.   ?Clear liquids allowed are: Water, Non-Citrus Juices (without pulp), Carbonated Beverages, Clear Tea, Black Coffee ONLY (NO MILK, CREAM OR POWDERED CREAMER of any kind), and Gatorade ? ?Patient Instructions ? ?The night before surgery:  ?No food after midnight. ONLY clear liquids after midnight ? ?The day of surgery (if you do NOT have diabetes):  ?Drink ONE (1) Pre-Surgery Clear Ensure by 10:00am the morning of surgery. Drink in one sitting. Do not sip.  ?This drink was given to you during your hospital  ?pre-op appointment visit. ?Nothing else to drink after completing the  ?Pre-Surgery Clear Ensure. ? ? ? ?       If you have questions, please contact your surgeon?s office. ? ?  ? Take these medicines the morning of surgery with A SIP OF WATER:  ?amLODipine (NORVASC) ?atorvastatin (LIPITOR) ?carvedilol (COREG)  ? ?As of today, STOP taking any Aspirin (unless otherwise instructed by your surgeon) Aleve, Naproxen, Ibuprofen, Motrin, Advil, Goody's, BC's, all herbal medications, fish oil, and all vitamins. ? ?         ?Do not wear jewelry or makeup ?Do not wear lotions, powders, perfumes/colognes, or deodorant. ?Do not shave 48 hours prior to surgery.   ?Do not bring valuables to the hospital. ?Do not wear nail polish, gel polish, artificial nails, or any other type of covering on natural nails (fingers and toes) ?If you have artificial nails or gel coating that need to be removed by a nail salon, please have this removed prior  to surgery. Artificial nails or gel coating may interfere with anesthesia's ability to adequately monitor your vital signs. ? ?[facility] is not responsible for any belongings or valuables. .  ? ?Do NOT Smoke (Tobacco/Vaping)  24 hours prior to your procedure ? ?If you use a CPAP at night, you may bring your mask for your overnight stay. ?  ?Contacts, glasses, hearing aids, dentures or partials may not be worn into surgery, please bring cases for these belongings ?  ?For patients admitted to the hospital, discharge time will be determined by your treatment team. ?  ?Patients discharged the day of surgery will not be allowed to drive home, and someone needs to stay with them for 24 hours. ? ?NO VISITORS WILL BE ALLOWED IN PRE-OP WHERE PATIENTS ARE PREPPED FOR SURGERY.  ONLY 1 SUPPORT PERSON MAY BE PRESENT IN THE WAITING ROOM WHILE YOU ARE IN SURGERY.  IF YOU ARE TO BE ADMITTED, ONCE YOU ARE IN YOUR ROOM YOU WILL BE ALLOWED TWO (2) VISITORS. 1 (ONE) VISITOR MAY STAY OVERNIGHT BUT MUST ARRIVE TO THE ROOM BY 8pm.  Minor children may have two parents present. Special consideration for safety and communication needs will be reviewed on a case by case basis. ? ?Special instructions:   ? ?Oral Hygiene is also important to reduce your risk of infection.  Remember - BRUSH YOUR TEETH THE MORNING OF SURGERY WITH YOUR REGULAR TOOTHPASTE ? ? ?[facility]- Preparing For Surgery ? ?Before  surgery, you can play an important role. Because skin is not sterile, your skin needs to be as free of germs as possible. You can reduce the number of germs on your skin by washing with CHG (chlorahexidine gluconate) Soap before surgery.  CHG is an antiseptic cleaner which kills germs and bonds with the skin to continue killing germs even after washing.   ? ? ?Please do not use if you have an allergy to CHG or antibacterial soaps. If your skin becomes reddened/irritated stop using the CHG.  ?Do not shave (including legs and underarms) for at  least 48 hours prior to first CHG shower. It is OK to shave your face. ? ?Please follow these instructions carefully. ?  ? ? Shower the NIGHT BEFORE SURGERY and the MORNING OF SURGERY with CHG Soap.  ? If you chose to wash your hair, wash your hair first as usual with your normal shampoo. After you shampoo, rinse your hair and body thoroughly to remove the shampoo.  Then [employer] and genitals (private parts) with your normal soap and rinse thoroughly to remove soap. ? ?After that Use CHG Soap as you would any other liquid soap. You can apply CHG directly to the skin and wash gently with a scrungie or a clean washcloth.  ? ?Apply the CHG Soap to your body ONLY FROM THE NECK DOWN.  Do not use on open wounds or open sores. Avoid contact with your eyes, ears, mouth and genitals (private parts). Wash Face and genitals (private parts)  with your normal soap.  ? ?Wash thoroughly, paying special attention to the area where your surgery will be performed. ? ?Thoroughly rinse your body with warm water from the neck down. ? ?DO NOT shower/wash with your normal soap after using and rinsing off the CHG Soap. ? ?Pat yourself dry with a CLEAN TOWEL. ? ?Wear CLEAN PAJAMAS to bed the night before surgery ? ?Place CLEAN SHEETS on your bed the night before your surgery ? ?DO NOT SLEEP WITH PETS. ? ? ?Day of Surgery: ?Take a shower with CHG soap. ?Wear Clean/Comfortable clothing the morning of surgery ?Do not apply any deodorants/lotions.   ?Remember to brush your teeth WITH YOUR REGULAR TOOTHPASTE. ? ? ? ?COVID testing ? ?If you are going to stay overnight or be admitted after your procedure/surgery and require a pre-op COVID test, please follow these instructions after your COVID test  ? ?You are not required to quarantine however you are required to wear a well-fitting mask when you are out and around people not in your household.  If your mask becomes wet or soiled, replace with a new one. ? ?Wash your hands often with soap and  water for 20 seconds or clean your hands with an alcohol-based hand sanitizer that contains at least 60% alcohol. ? ?Do not share personal items. ? ?Notify your provider: ?if you are in close contact with someone who has COVID  ?or if you develop a fever of 100.4 or greater, sneezing, cough, sore throat, shortness of breath or body aches. ? ?  ?Please read over the following fact sheets that you were given.  ? ?

## 2022-02-15 ENCOUNTER — Other Ambulatory Visit: Payer: Self-pay | Admitting: Surgery

## 2022-02-15 ENCOUNTER — Other Ambulatory Visit: Payer: Self-pay

## 2022-02-15 ENCOUNTER — Encounter (HOSPITAL_COMMUNITY)
Admission: RE | Admit: 2022-02-15 | Discharge: 2022-02-15 | Disposition: A | Discharge status: Home / Self Care | Payer: Medicare HMO | Source: Ambulatory Visit | Attending: Surgery | Admitting: Surgery

## 2022-02-15 ENCOUNTER — Encounter (HOSPITAL_COMMUNITY): Payer: Self-pay

## 2022-02-15 VITALS — BP 176/92 | HR 82 | Temp 97.5°F | Resp 17 | Ht 65.0 in | Wt 112.1 lb

## 2022-02-15 DIAGNOSIS — I5020 Unspecified systolic (congestive) heart failure: Secondary | ICD-10-CM | POA: Diagnosis not present

## 2022-02-15 DIAGNOSIS — I2510 Atherosclerotic heart disease of native coronary artery without angina pectoris: Secondary | ICD-10-CM | POA: Insufficient documentation

## 2022-02-15 DIAGNOSIS — D649 Anemia, unspecified: Secondary | ICD-10-CM | POA: Insufficient documentation

## 2022-02-15 DIAGNOSIS — Z01812 Encounter for preprocedural laboratory examination: Secondary | ICD-10-CM | POA: Diagnosis present

## 2022-02-15 DIAGNOSIS — Z01818 Encounter for other preprocedural examination: Secondary | ICD-10-CM

## 2022-02-15 HISTORY — DX: Heart failure, unspecified: I50.9

## 2022-02-15 HISTORY — DX: Malignant (primary) neoplasm, unspecified: C80.1

## 2022-02-15 HISTORY — DX: Essential (primary) hypertension: I10

## 2022-02-15 HISTORY — DX: Cardiac murmur, unspecified: R01.1

## 2022-02-15 LAB — CBC
HCT: 32.4 % — ABNORMAL LOW (ref 36.0–46.0)
Hemoglobin: 9.9 g/dL — ABNORMAL LOW (ref 12.0–15.0)
MCH: 25.6 pg — ABNORMAL LOW (ref 26.0–34.0)
MCHC: 30.6 g/dL (ref 30.0–36.0)
MCV: 83.9 fL (ref 80.0–100.0)
Platelets: 554 10*3/uL — ABNORMAL HIGH (ref 150–400)
RBC: 3.86 MIL/uL — ABNORMAL LOW (ref 3.87–5.11)
RDW: 19 % — ABNORMAL HIGH (ref 11.5–15.5)
WBC: 6 10*3/uL (ref 4.0–10.5)
nRBC: 0 % (ref 0.0–0.2)

## 2022-02-15 LAB — BASIC METABOLIC PANEL
Anion gap: 10 (ref 5–15)
BUN: 12 mg/dL (ref 8–23)
CO2: 22 mmol/L (ref 22–32)
Calcium: 9.1 mg/dL (ref 8.9–10.3)
Chloride: 105 mmol/L (ref 98–111)
Creatinine, Ser: 0.62 mg/dL (ref 0.44–1.00)
GFR, Estimated: >60 mL/min (ref >60)
Glucose, Bld: 107 mg/dL — ABNORMAL HIGH (ref 70–99)
Potassium: 4 mmol/L (ref 3.5–5.1)
Sodium: 137 mmol/L (ref 135–145)

## 2022-02-15 NOTE — Progress Notes (Addendum)
PCP - Benito Mccreedy- pt states she will see MD next week, prior to surgery ?Cardiologist - Dr. Haroldine Laws ? ?Chest x-ray - N/A ?EKG - 10/06/21 ?Stress Test - 09/02/20 ?ECHO - 11/09/21 ?Cardiac Cath - 09/06/20 ? ?Sleep Study - denies ? ?Aspirin Instructions:As of today, STOP taking any Aspirin (unless otherwise instructed by your surgeon) Aleve, Naproxen, Ibuprofen, Motrin, Advil, Goody's, BC's, all herbal medications, fish oil, and all vitamins. ? ?ERAS Protcol -ERAS ordered- Ensure given to patient at PAT appointment ? ?COVID TEST- N/A pt asymptomatic and no exposures per patient.  ? ? ?Anesthesia review: yes, heart history, CBC results abnormal (results routed in epic to Dr. Ninfa Linden- [facility] triage nurse notified via phone) ? ?Patient denies shortness of breath, fever, cough and chest pain at PAT appointment ? ? ?All instructions explained to the patient, with a verbal understanding of the material. Patient agrees to go over the instructions while at home for a better understanding. The opportunity to ask questions was provided. ? ? ?

## 2022-02-15 NOTE — Progress Notes (Signed)
Surgical Instructions ? ? Your procedure is scheduled on Tuesday, March 28 ? Report to [facility] Main Entrance "A" at 11:00 A.M., then check in with the Admitting office. ? Call this number if you have problems the morning of surgery: ? [phone] ? ? If you have any questions prior to your surgery date call [phone]: Open Monday-Friday 8am-4pm ? ? Remember: ? Do not eat after midnight the night before your surgery ? ?You may drink clear liquids until 10:00am the morning of your surgery.   ?Clear liquids allowed are: Water, Non-Citrus Juices (without pulp), Carbonated Beverages, Clear Tea, Black Coffee ONLY (NO MILK, CREAM OR POWDERED CREAMER of any kind), and Gatorade ? ?Patient Instructions ? ?The night before surgery:  ?No food after midnight. ONLY clear liquids after midnight ? ?The day of surgery (if you do NOT have diabetes):  ?Drink ONE (1) Pre-Surgery Clear Ensure by 10:00am the morning of surgery. Drink in one sitting. Do not sip.  ?This drink was given to you during your hospital  ?pre-op appointment visit. ?Nothing else to drink after completing the  ?Pre-Surgery Clear Ensure. ? ?       If you have questions, please contact your surgeon?s office. ? ? Take these medicines the morning of surgery with A SIP OF WATER:  ?amLODipine (NORVASC) ?atorvastatin (LIPITOR) ?carvedilol (COREG)  ?hydrOXYzine (ATARAX) if needed ?Acetaminophen (Tylenol) if needed ? ?As of today, STOP taking any Aspirin (unless otherwise instructed by your surgeon) Aleve, Naproxen, Ibuprofen, Motrin, Advil, Goody's, BC's, all herbal medications, fish oil, and all vitamins. ? ?         ?Do not wear jewelry or makeup ?Do not wear lotions, powders, perfumes/colognes, or deodorant. ?Do not shave 48 hours prior to surgery.   ?Do not bring valuables to the hospital. ?Do not wear nail polish, gel polish, artificial nails, or any other type of covering on natural nails (fingers and toes) ?If you have artificial nails or gel coating that  need to be removed by a nail salon, please have this removed prior to surgery. Artificial nails or gel coating may interfere with anesthesia's ability to adequately monitor your vital signs. ? ?[facility] is not responsible for any belongings or valuables. .  ? ?Do NOT Smoke (Tobacco/Vaping)  24 hours prior to your procedure ? ?If you use a CPAP at night, you may bring your mask for your overnight stay. ?  ?Contacts, glasses, hearing aids, dentures or partials may not be worn into surgery, please bring cases for these belongings ?  ?For patients admitted to the hospital, discharge time will be determined by your treatment team. ?  ?Patients discharged the day of surgery will not be allowed to drive home, and someone needs to stay with them for 24 hours. ? ?NO VISITORS WILL BE ALLOWED IN PRE-OP WHERE PATIENTS ARE PREPPED FOR SURGERY.  ONLY 1 SUPPORT PERSON MAY BE PRESENT IN THE WAITING ROOM WHILE YOU ARE IN SURGERY.  IF YOU ARE TO BE ADMITTED, ONCE YOU ARE IN YOUR ROOM YOU WILL BE ALLOWED TWO (2) VISITORS. 1 (ONE) VISITOR MAY STAY OVERNIGHT BUT MUST ARRIVE TO THE ROOM BY 8pm.  Minor children may have two parents present. Special consideration for safety and communication needs will be reviewed on a case by case basis. ? ?Special instructions:   ? ?Oral Hygiene is also important to reduce your risk of infection.  Remember - BRUSH YOUR TEETH THE MORNING OF SURGERY WITH YOUR REGULAR TOOTHPASTE ? ? ?[facility]- Preparing  For Surgery ? ?Before surgery, you can play an important role. Because skin is not sterile, your skin needs to be as free of germs as possible. You can reduce the number of germs on your skin by washing with CHG (chlorahexidine gluconate) Soap before surgery.  CHG is an antiseptic cleaner which kills germs and bonds with the skin to continue killing germs even after washing.   ? ? ?Please do not use if you have an allergy to CHG or antibacterial soaps. If your skin becomes reddened/irritated stop using  the CHG.  ?Do not shave (including legs and underarms) for at least 48 hours prior to first CHG shower. It is OK to shave your face. ? ?Please follow these instructions carefully. ?  ? ? Shower the NIGHT BEFORE SURGERY and the MORNING OF SURGERY with CHG Soap.  ? If you chose to wash your hair, wash your hair first as usual with your normal shampoo. After you shampoo, rinse your hair and body thoroughly to remove the shampoo.  Then [employer] and genitals (private parts) with your normal soap and rinse thoroughly to remove soap. ? ?After that Use CHG Soap as you would any other liquid soap. You can apply CHG directly to the skin and wash gently with a scrungie or a clean washcloth.  ? ?Apply the CHG Soap to your body ONLY FROM THE NECK DOWN.  Do not use on open wounds or open sores. Avoid contact with your eyes, ears, mouth and genitals (private parts). Wash Face and genitals (private parts)  with your normal soap.  ? ?Wash thoroughly, paying special attention to the area where your surgery will be performed. ? ?Thoroughly rinse your body with warm water from the neck down. ? ?DO NOT shower/wash with your normal soap after using and rinsing off the CHG Soap. ? ?Pat yourself dry with a CLEAN TOWEL. ? ?Wear CLEAN PAJAMAS to bed the night before surgery ? ?Place CLEAN SHEETS on your bed the night before your surgery ? ?DO NOT SLEEP WITH PETS. ? ? ?Day of Surgery: ?Take a shower with CHG soap. ?Wear Clean/Comfortable clothing the morning of surgery ?Do not apply any deodorants/lotions.   ?Remember to brush your teeth WITH YOUR REGULAR TOOTHPASTE. ? ? ?Notify your provider: ?if you are in close contact with someone who has COVID  ?or if you develop a fever of 100.4 or greater, sneezing, cough, sore throat, shortness of breath or body aches. ? ?  ?Please read over the following fact sheets that you were given.  ? ?

## 2022-02-15 NOTE — Progress Notes (Signed)
[facility] triage notified that pt consent order needs to be updated. Current order states "left breast cancer". Pt states she has right breast cancer. Per triage, Dr. Ninfa Linden will be notified.  ?

## 2022-02-16 NOTE — Anesthesia Preprocedure Evaluation (Addendum)
Anesthesia Evaluation  ?Patient identified by MRN, date of birth, ID band ?Patient awake ? ? ? ?Reviewed: ?Allergy & Precautions, H&P , NPO status , Patient's Chart, lab work & pertinent test results ? ?Airway ?Mallampati: II ? ? ?Neck ROM: full ? ? ? Dental ?  ?Pulmonary ?Current Smoker,  ?  ?breath sounds clear to auscultation ? ? ? ? ? ? Cardiovascular ?hypertension, + Peripheral Vascular Disease and +CHF  ? ?Rhythm:regular Rate:Normal ? ?EF 45% ?  ?Neuro/Psych ?PSYCHIATRIC DISORDERS Anxiety   ? GI/Hepatic ?  ?Endo/Other  ? ? Renal/GU ?  ? ?  ?Musculoskeletal ? ? Abdominal ?  ?Peds ? Hematology ?  ?Anesthesia Other Findings ? ? Reproductive/Obstetrics ? ?  ? ? ? ? ? ? ? ? ? ? ? ? ? ?  ?  ? ? ? ? ? ? ? ?Anesthesia Physical ?Anesthesia Plan ? ?ASA: 3 ? ?Anesthesia Plan: General  ? ?Post-op Pain Management: Regional block*  ? ?Induction:  ? ?PONV Risk Score and Plan: 2 and Ondansetron, Dexamethasone and Treatment may vary due to age or medical condition ? ?Airway Management Planned: LMA ? ?Additional Equipment:  ? ?Intra-op Plan:  ? ?Post-operative Plan: Extubation in OR ? ?Informed Consent: I have reviewed the patients History and Physical, chart, labs and discussed the procedure including the risks, benefits and alternatives for the proposed anesthesia with the patient or authorized representative who has indicated his/her understanding and acceptance.  ? ? ? ?Dental advisory given ? ?Plan Discussed with: CRNA, Anesthesiologist and Surgeon ? ?Anesthesia Plan Comments: (PAT note by Karoline Caldwell, PA-C: ?Pt follows with cardiologist Dr. Haroldine Laws for hx of HFrEF (EF 45-50% by echo 12/22). It is noted that her depressed EF predated the start of chemotherapy. 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 Last seen by Dr. Haroldine Laws 12/23/21. Per note, "LV dysfunction predated chemotherapy. 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 Does have evidence of  CAD but no ischemic symptoms or regional wall motion abnormalities. - Focus on aggressive medical therapy with GDMT and tight control of BP." ? ?Proep labs reviewed, stable anemia with Hgb 9.9, platelets mildly elevated at 554, labs otherwise unremarkable.  ? ?EKG 10/06/21: Sinus bradycardia. Rate 56.  Left ventricular hypertrophy with repolarization abnormality. Cannot rule out Septal infarct , age undetermined ? ?TTE 11/09/21: ??1. Left ventricular ejection fraction, by estimation, is 45 to 50%. The  ?left ventricle has mildly decreased function. The left ventricle has no  ?regional wall motion abnormalities. Left ventricular diastolic parameters  ?are consistent with Grade I  ?diastolic dysfunction (impaired relaxation).  ??2. Right ventricular systolic function is normal. The right ventricular  ?size is normal. There is normal pulmonary artery systolic pressure.  ??3. Left atrial size was moderately dilated.  ??4. The mitral valve is normal in structure. Mild mitral valve  ?regurgitation. No evidence of mitral stenosis.  ??5. The aortic valve is tricuspid. There is mild calcification of the  ?aortic valve. Aortic valve regurgitation is mild to moderate. No aortic  ?stenosis is present.  ??6. The inferior vena cava is normal in size with greater than 50%  ?respiratory variability, suggesting right atrial pressure of 3 mmHg. ? ?CT Chest 09/23/21: ?IMPRESSION: ?1. Large, heterogeneous mass of the central right breast with ?extensive overlying skin thickening, in keeping with known primary ?breast malignancy, better assessed by recent MR. ?2. Abnormally enlarged right axillary lymph nodes, consistent with ?nodal metastatic disease. ?3. No evidence of abdominal lymphadenopathy or distant metastatic ?disease in  the chest, abdomen, or pelvis. Coronary artery disease. ?4. Emphysema. ?5. Cardiomegaly and coronary artery disease. ?? ?Cath 09/06/20 (care everywhere): ?Mild nonobstructive CAD  ?Severe global LV dysfunction  (echo). 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 ? ? ? ? ?Anesthesia Quick Evaluation ? ?

## 2022-02-16 NOTE — Progress Notes (Signed)
Anesthesia Chart Review: ? ?Pt follows with cardiologist Dr. Haroldine Laws for hx of HFrEF (EF 45-50% by echo 12/22). It is noted that her depressed EF predated the start of chemotherapy. 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 Last seen by Dr. Haroldine Laws 12/23/21. Per note, "LV dysfunction predated chemotherapy. 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 Does have evidence of CAD but no ischemic symptoms or regional wall motion abnormalities. - Focus on aggressive medical therapy with GDMT and tight control of BP." ? ?Proep labs reviewed, stable anemia with Hgb 9.9, platelets mildly elevated at 554, labs otherwise unremarkable.  ? ?EKG 10/06/21: Sinus bradycardia. Rate 56.  Left ventricular hypertrophy with repolarization abnormality. Cannot rule out Septal infarct , age undetermined ? ?TTE 11/09/21: ? 1. Left ventricular ejection fraction, by estimation, is 45 to 50%. The  ?left ventricle has mildly decreased function. The left ventricle has no  ?regional wall motion abnormalities. Left ventricular diastolic parameters  ?are consistent with Grade I  ?diastolic dysfunction (impaired relaxation).  ? 2. Right ventricular systolic function is normal. The right ventricular  ?size is normal. There is normal pulmonary artery systolic pressure.  ? 3. Left atrial size was moderately dilated.  ? 4. The mitral valve is normal in structure. Mild mitral valve  ?regurgitation. No evidence of mitral stenosis.  ? 5. The aortic valve is tricuspid. There is mild calcification of the  ?aortic valve. Aortic valve regurgitation is mild to moderate. No aortic  ?stenosis is present.  ? 6. The inferior vena cava is normal in size with greater than 50%  ?respiratory variability, suggesting right atrial pressure of 3 mmHg. ? ?CT Chest 09/23/21: ?IMPRESSION: ?1. Large, heterogeneous mass of the central right breast with ?extensive overlying skin thickening, in keeping with known primary ?breast malignancy,  better assessed by recent MR. ?2. Abnormally enlarged right axillary lymph nodes, consistent with ?nodal metastatic disease. ?3. No evidence of abdominal lymphadenopathy or distant metastatic ?disease in the chest, abdomen, or pelvis. Coronary artery disease. ?4. Emphysema. ?5. Cardiomegaly and coronary artery disease. ?  ?Cath 09/06/20 (care everywhere): ?Mild nonobstructive CAD  ?Severe global LV dysfunction (echo). 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 ? ?Karoline Caldwell, PA-C ?[facility]/Anesthesiology ?Phone [phone] ?02/16/2022 2:49 PM ? ?

## 2022-02-21 NOTE — H&P (Signed)
Lynn Mcgee is an 66 y.o. female.   ?Chief Complaint: Inflammatory right breast cancer ?HPI: This is a 66 year old female who has been undergoing neoadjuvant therapy for inflammatory right breast cancer.  Her most recent MRI is now showed almost complete resolution of the enhancement in the right breast as well as resolution of lymphadenopathy.  A right modified radical mastectomy is recommended.  She is also requesting a left mastectomy.  Reconstruction will not be performed.  She has been doing well ? ?Past Medical History:  ?Diagnosis Date  ? Cancer [facility])   ? right breast cancer  ? CHF (congestive heart failure) ([place])   ? per patient  ? Heart murmur   ? pt states MD stated she had heart murmur "years ago"  ? Hypertension   ? ? ?Past Surgical History:  ?Procedure Laterality Date  ? ABDOMINAL HYSTERECTOMY    ? CARDIAC CATHETERIZATION    ? 2021  ? IR IMAGING GUIDED PORT INSERTION  09/17/2021  ? ? ?No family history on file. ?Social History:  reports that she has been smoking cigarettes. She has a 1.00 pack-year smoking history. She has never used smokeless tobacco. She reports that she does not currently use alcohol. She reports current drug use. Drug: Marijuana. ? ?Allergies: No Known Allergies ? ?No medications prior to admission.  ? ? ?BP: (!) 166/91  ?Pulse: 95  ?Resp: 17  ?Temp: 98.1 ?F (36.7 ?C)  ?SpO2: 92%  ? ? ?Review of Systems  ?All other systems reviewed and are negative. ? ? ?Physical Exam  ?Generally well in appearance ?Lungs clear bilaterally ?Cardiovascular regular rate and rhythm ?Abdomen is soft and nontender ?Port-A-Cath site is stable ?Near normal breast exam bilaterally ? ?Assessment/Plan ?Inflammatory right breast cancer ? ?She has had significant improvement with neoadjuvant therapy based on her MRI.  She still needs a modified radical mastectomy on the right side.  She is also requesting removal of the left breast which would be with a total mastectomy.  We discussed the surgical procedure in  detail.  We discussed the drains will be needed postoperatively on both sides.  We discussed the risks which includes but is not limited to bleeding, infection, the need for further surgery if margins are positive, injury to surrounding structures, seroma formation, postoperative recovery, etc.  The Port-A-Cath will be left in place.  Surgery is scheduled ? ?[name], MD ?02/21/2022, 12:56 PM ? ? ? ?

## 2022-02-22 ENCOUNTER — Other Ambulatory Visit: Payer: Self-pay

## 2022-02-22 ENCOUNTER — Encounter (HOSPITAL_COMMUNITY)
Admission: RE | Disposition: A | Discharge status: Home / Self Care | Payer: Self-pay | Source: Home / Self Care | Attending: Surgery

## 2022-02-22 ENCOUNTER — Encounter (HOSPITAL_COMMUNITY): Payer: Self-pay | Admitting: Surgery

## 2022-02-22 ENCOUNTER — Observation Stay (HOSPITAL_COMMUNITY)
Admission: RE | Admit: 2022-02-22 | Discharge: 2022-02-23 | Disposition: A | Discharge status: Home / Self Care | Payer: Medicare HMO | Attending: Surgery | Admitting: Surgery

## 2022-02-22 ENCOUNTER — Ambulatory Visit (HOSPITAL_COMMUNITY): Payer: Medicare HMO | Admitting: Physician Assistant

## 2022-02-22 ENCOUNTER — Ambulatory Visit (HOSPITAL_BASED_OUTPATIENT_CLINIC_OR_DEPARTMENT_OTHER): Payer: Medicare HMO | Admitting: Critical Care Medicine

## 2022-02-22 DIAGNOSIS — I509 Heart failure, unspecified: Secondary | ICD-10-CM | POA: Insufficient documentation

## 2022-02-22 DIAGNOSIS — N6031 Fibrosclerosis of right breast: Secondary | ICD-10-CM | POA: Diagnosis not present

## 2022-02-22 DIAGNOSIS — N6032 Fibrosclerosis of left breast: Secondary | ICD-10-CM | POA: Insufficient documentation

## 2022-02-22 DIAGNOSIS — I110 Hypertensive heart disease with heart failure: Secondary | ICD-10-CM

## 2022-02-22 DIAGNOSIS — C50911 Malignant neoplasm of unspecified site of right female breast: Secondary | ICD-10-CM

## 2022-02-22 DIAGNOSIS — Z79899 Other long term (current) drug therapy: Secondary | ICD-10-CM | POA: Diagnosis not present

## 2022-02-22 DIAGNOSIS — I739 Peripheral vascular disease, unspecified: Secondary | ICD-10-CM | POA: Diagnosis not present

## 2022-02-22 DIAGNOSIS — Z9013 Acquired absence of bilateral breasts and nipples: Secondary | ICD-10-CM

## 2022-02-22 DIAGNOSIS — F17210 Nicotine dependence, cigarettes, uncomplicated: Secondary | ICD-10-CM | POA: Diagnosis not present

## 2022-02-22 HISTORY — PX: MODIFIED MASTECTOMY: SHX5268

## 2022-02-22 HISTORY — PX: TOTAL MASTECTOMY: SHX6129

## 2022-02-22 SURGERY — MODIFIED MASTECTOMY
Anesthesia: General | Site: Breast | Laterality: Right

## 2022-02-22 MED ORDER — CHLORHEXIDINE GLUCONATE CLOTH 2 % EX PADS: 6.0000 | MEDICATED_PAD | Freq: Once | CUTANEOUS | Status: DC

## 2022-02-22 MED ORDER — FENTANYL CITRATE (PF) 250 MCG/5ML IJ SOLN
INTRAMUSCULAR | Status: AC
  Filled 2022-02-22: qty 5

## 2022-02-22 MED ORDER — PROPOFOL 10 MG/ML IV BOLUS
INTRAVENOUS | Status: DC | PRN
Start: 2022-02-22 — End: 2022-02-22
  Administered 2022-02-22: 120 mg via INTRAVENOUS

## 2022-02-22 MED ORDER — ORAL CARE MOUTH RINSE: 15.0000 mL | Freq: Once | OROMUCOSAL | Status: AC

## 2022-02-22 MED ORDER — HYDROMORPHONE HCL 1 MG/ML IJ SOLN: 1.0000 mg | INTRAMUSCULAR | Status: DC | PRN

## 2022-02-22 MED ORDER — ACETAMINOPHEN 500 MG PO TABS: 1000.0000 mg | ORAL_TABLET | ORAL | Status: AC

## 2022-02-22 MED ORDER — BUPIVACAINE HCL (PF) 0.25 % IJ SOLN
INTRAMUSCULAR | Status: DC | PRN
  Administered 2022-02-22 (×2): 25 mL via PERINEURAL

## 2022-02-22 MED ORDER — POTASSIUM CHLORIDE IN NACL 20-0.9 MEQ/L-% IV SOLN
INTRAVENOUS | Status: DC
  Filled 2022-02-22 (×2): qty 1000

## 2022-02-22 MED ORDER — FENTANYL CITRATE (PF) 100 MCG/2ML IJ SOLN
25.0000 ug | INTRAMUSCULAR | Status: DC | PRN
  Administered 2022-02-22 (×3): 25 ug via INTRAVENOUS

## 2022-02-22 MED ORDER — CHLORHEXIDINE GLUCONATE 0.12 % MT SOLN: 15.0000 mL | Freq: Once | OROMUCOSAL | Status: AC

## 2022-02-22 MED ORDER — MIDAZOLAM HCL 2 MG/2ML IJ SOLN
INTRAMUSCULAR | Status: AC
  Filled 2022-02-22: qty 2

## 2022-02-22 MED ORDER — FENTANYL CITRATE (PF) 100 MCG/2ML IJ SOLN
INTRAMUSCULAR | Status: AC
  Administered 2022-02-22: 50 ug via INTRAVENOUS
  Filled 2022-02-22: qty 2

## 2022-02-22 MED ORDER — ACETAMINOPHEN 500 MG PO TABS
ORAL_TABLET | ORAL | Status: AC
  Administered 2022-02-22: 1000 mg via ORAL
  Filled 2022-02-22: qty 2

## 2022-02-22 MED ORDER — LIDOCAINE HCL (CARDIAC) PF 100 MG/5ML IV SOSY
PREFILLED_SYRINGE | INTRAVENOUS | Status: DC | PRN
  Administered 2022-02-22: 50 mg via INTRAVENOUS

## 2022-02-22 MED ORDER — DEXAMETHASONE SODIUM PHOSPHATE 4 MG/ML IJ SOLN
INTRAMUSCULAR | Status: DC | PRN
  Administered 2022-02-22: 5 mg via INTRAVENOUS

## 2022-02-22 MED ORDER — PHENYLEPHRINE HCL-NACL 20-0.9 MG/250ML-% IV SOLN
INTRAVENOUS | Status: DC | PRN
  Administered 2022-02-22: 20 ug/min via INTRAVENOUS

## 2022-02-22 MED ORDER — 0.9 % SODIUM CHLORIDE (POUR BTL) OPTIME
TOPICAL | Status: DC | PRN
  Administered 2022-02-22: 1000 mL

## 2022-02-22 MED ORDER — SPIRONOLACTONE 25 MG PO TABS
25.0000 mg | ORAL_TABLET | Freq: Every day | ORAL | Status: DC
Start: 2022-02-23 — End: 2022-02-23
  Administered 2022-02-23: 25 mg via ORAL
  Filled 2022-02-22 (×2): qty 1

## 2022-02-22 MED ORDER — FENTANYL CITRATE (PF) 100 MCG/2ML IJ SOLN: 50.0000 ug | Freq: Once | INTRAMUSCULAR | Status: AC

## 2022-02-22 MED ORDER — OXYCODONE HCL 5 MG PO TABS: 5.0000 mg | ORAL_TABLET | ORAL | Status: DC | PRN

## 2022-02-22 MED ORDER — CEFAZOLIN SODIUM-DEXTROSE 2-4 GM/100ML-% IV SOLN: 2.0000 g | INTRAVENOUS | Status: DC

## 2022-02-22 MED ORDER — ENOXAPARIN SODIUM 40 MG/0.4ML IJ SOSY
40.0000 mg | PREFILLED_SYRINGE | INTRAMUSCULAR | Status: DC
  Administered 2022-02-23: 40 mg via SUBCUTANEOUS
  Filled 2022-02-22: qty 0.4

## 2022-02-22 MED ORDER — LOSARTAN POTASSIUM 50 MG PO TABS
50.0000 mg | ORAL_TABLET | Freq: Every day | ORAL | Status: DC
  Administered 2022-02-23: 50 mg via ORAL
  Filled 2022-02-22 (×2): qty 1

## 2022-02-22 MED ORDER — DIPHENHYDRAMINE HCL 12.5 MG/5ML PO ELIX: 12.5000 mg | ORAL_SOLUTION | Freq: Four times a day (QID) | ORAL | Status: DC | PRN

## 2022-02-22 MED ORDER — OXYCODONE HCL 5 MG PO TABS: 5.0000 mg | ORAL_TABLET | Freq: Once | ORAL | Status: DC | PRN

## 2022-02-22 MED ORDER — CARVEDILOL 3.125 MG PO TABS
6.2500 mg | ORAL_TABLET | Freq: Two times a day (BID) | ORAL | Status: DC
  Administered 2022-02-22: 6.25 mg via ORAL

## 2022-02-22 MED ORDER — FENTANYL CITRATE (PF) 100 MCG/2ML IJ SOLN
INTRAMUSCULAR | Status: DC | PRN
  Administered 2022-02-22: 25 ug via INTRAVENOUS
  Administered 2022-02-22 (×2): 50 ug via INTRAVENOUS

## 2022-02-22 MED ORDER — CARVEDILOL 3.125 MG PO TABS
ORAL_TABLET | ORAL | Status: AC
  Filled 2022-02-22: qty 2

## 2022-02-22 MED ORDER — CEFAZOLIN SODIUM-DEXTROSE 2-4 GM/100ML-% IV SOLN
INTRAVENOUS | Status: AC
  Filled 2022-02-22: qty 100

## 2022-02-22 MED ORDER — HYDROXYZINE HCL 25 MG PO TABS: 25.0000 mg | ORAL_TABLET | Freq: Three times a day (TID) | ORAL | Status: DC | PRN

## 2022-02-22 MED ORDER — ONDANSETRON HCL 4 MG/2ML IJ SOLN: 4.0000 mg | Freq: Four times a day (QID) | INTRAMUSCULAR | Status: DC | PRN

## 2022-02-22 MED ORDER — ONDANSETRON HCL 4 MG/2ML IJ SOLN
INTRAMUSCULAR | Status: DC | PRN
  Administered 2022-02-22: 4 mg via INTRAVENOUS

## 2022-02-22 MED ORDER — CARVEDILOL 6.25 MG PO TABS
6.2500 mg | ORAL_TABLET | Freq: Two times a day (BID) | ORAL | Status: DC
  Administered 2022-02-22 – 2022-02-23 (×3): 6.25 mg via ORAL
  Filled 2022-02-22 (×3): qty 1

## 2022-02-22 MED ORDER — OXYCODONE HCL 5 MG/5ML PO SOLN: 5.0000 mg | Freq: Once | ORAL | Status: DC | PRN

## 2022-02-22 MED ORDER — LACTATED RINGERS IV SOLN: INTRAVENOUS | Status: DC

## 2022-02-22 MED ORDER — EPHEDRINE SULFATE (PRESSORS) 50 MG/ML IJ SOLN
INTRAMUSCULAR | Status: DC | PRN
  Administered 2022-02-22 (×3): 10 mg via INTRAVENOUS
  Administered 2022-02-22: 5 mg via INTRAVENOUS

## 2022-02-22 MED ORDER — ONDANSETRON 4 MG PO TBDP: 4.0000 mg | ORAL_TABLET | Freq: Four times a day (QID) | ORAL | Status: DC | PRN

## 2022-02-22 MED ORDER — ACETAMINOPHEN 500 MG PO TABS
1000.0000 mg | ORAL_TABLET | Freq: Four times a day (QID) | ORAL | Status: DC
  Administered 2022-02-22 – 2022-02-23 (×2): 1000 mg via ORAL
  Filled 2022-02-22 (×2): qty 2

## 2022-02-22 MED ORDER — CHLORHEXIDINE GLUCONATE 0.12 % MT SOLN
OROMUCOSAL | Status: AC
  Administered 2022-02-22: 15 mL via OROMUCOSAL
  Filled 2022-02-22: qty 15

## 2022-02-22 MED ORDER — ENSURE PRE-SURGERY PO LIQD: 296.0000 mL | Freq: Once | ORAL | Status: DC

## 2022-02-22 MED ORDER — TRAMADOL HCL 50 MG PO TABS: 50.0000 mg | ORAL_TABLET | Freq: Four times a day (QID) | ORAL | Status: DC | PRN

## 2022-02-22 MED ORDER — PROPOFOL 10 MG/ML IV BOLUS
INTRAVENOUS | Status: AC
  Filled 2022-02-22: qty 20

## 2022-02-22 MED ORDER — AMLODIPINE BESYLATE 5 MG PO TABS
5.0000 mg | ORAL_TABLET | Freq: Every day | ORAL | Status: DC
Start: 2022-02-23 — End: 2022-02-23
  Administered 2022-02-23: 5 mg via ORAL
  Filled 2022-02-22 (×2): qty 1

## 2022-02-22 MED ORDER — GABAPENTIN 100 MG PO CAPS
100.0000 mg | ORAL_CAPSULE | Freq: Every day | ORAL | Status: DC
  Administered 2022-02-22: 100 mg via ORAL
  Filled 2022-02-22: qty 1

## 2022-02-22 MED ORDER — DIPHENHYDRAMINE HCL 50 MG/ML IJ SOLN: 12.5000 mg | Freq: Four times a day (QID) | INTRAMUSCULAR | Status: DC | PRN

## 2022-02-22 MED ORDER — FENTANYL CITRATE (PF) 100 MCG/2ML IJ SOLN
INTRAMUSCULAR | Status: AC
  Filled 2022-02-22: qty 2

## 2022-02-22 SURGICAL SUPPLY — 42 items
APPLIER CLIP 9.375 MED OPEN (MISCELLANEOUS) ×6
BINDER BREAST LRG (GAUZE/BANDAGES/DRESSINGS) IMPLANT
BINDER BREAST XLRG (GAUZE/BANDAGES/DRESSINGS) ×1 IMPLANT
BIOPATCH RED 1 DISK 7.0 (GAUZE/BANDAGES/DRESSINGS) ×3 IMPLANT
CANISTER SUCT 3000ML PPV (MISCELLANEOUS) ×3 IMPLANT
CHLORAPREP W/TINT 26 (MISCELLANEOUS) ×3 IMPLANT
CLIP APPLIE 9.375 MED OPEN (MISCELLANEOUS) ×2 IMPLANT
COVER SURGICAL LIGHT HANDLE (MISCELLANEOUS) ×4 IMPLANT
DERMABOND ADVANCED (GAUZE/BANDAGES/DRESSINGS) ×3
DERMABOND ADVANCED .7 DNX12 (GAUZE/BANDAGES/DRESSINGS) ×2 IMPLANT
DRAIN CHANNEL 19F RND (DRAIN) ×7 IMPLANT
DRAPE LAPAROSCOPIC ABDOMINAL (DRAPES) ×3 IMPLANT
DRSG TEGADERM 4X4.75 (GAUZE/BANDAGES/DRESSINGS) ×2 IMPLANT
ELECT REM PT RETURN 9FT ADLT (ELECTROSURGICAL) ×3
ELECTRODE REM PT RTRN 9FT ADLT (ELECTROSURGICAL) ×2 IMPLANT
EVACUATOR SILICONE 100CC (DRAIN) ×7 IMPLANT
GAUZE SPONGE 4X4 12PLY STRL (GAUZE/BANDAGES/DRESSINGS) ×2 IMPLANT
GAUZE SPONGE 4X4 12PLY STRL LF (GAUZE/BANDAGES/DRESSINGS) ×2 IMPLANT
GLOVE SURG SIGNA 7.5 PF LTX (GLOVE) ×3 IMPLANT
GOWN STRL REUS W/ TWL LRG LVL3 (GOWN DISPOSABLE) ×4 IMPLANT
GOWN STRL REUS W/ TWL XL LVL3 (GOWN DISPOSABLE) ×2 IMPLANT
GOWN STRL REUS W/TWL LRG LVL3 (GOWN DISPOSABLE) ×2
GOWN STRL REUS W/TWL XL LVL3 (GOWN DISPOSABLE) ×1
KIT BASIN OR (CUSTOM PROCEDURE TRAY) ×3 IMPLANT
KIT TURNOVER KIT B (KITS) ×3 IMPLANT
NS IRRIG 1000ML POUR BTL (IV SOLUTION) ×3 IMPLANT
PACK GENERAL/GYN (CUSTOM PROCEDURE TRAY) ×3 IMPLANT
PAD ABD 7.5X8 STRL (GAUZE/BANDAGES/DRESSINGS) ×1 IMPLANT
PAD ABD 8X10 STRL (GAUZE/BANDAGES/DRESSINGS) ×1 IMPLANT
PAD ARMBOARD 7.5X6 YLW CONV (MISCELLANEOUS) ×6 IMPLANT
PENCIL SMOKE EVACUATOR (MISCELLANEOUS) ×3 IMPLANT
SPECIMEN JAR X LARGE (MISCELLANEOUS) ×3 IMPLANT
STAPLER VISISTAT 35W (STAPLE) ×3 IMPLANT
SUT ETHILON 2 0 FS 18 (SUTURE) ×1 IMPLANT
SUT ETHILON 3 0 FSL (SUTURE) ×4 IMPLANT
SUT MNCRL AB 4-0 PS2 18 (SUTURE) ×3 IMPLANT
SUT MON AB 4-0 PC3 18 (SUTURE) ×2 IMPLANT
SUT SILK 2 0 SH (SUTURE) ×1 IMPLANT
SUT VIC AB 3-0 SH 18 (SUTURE) ×3 IMPLANT
SUT VIC AB 3-0 SH 27 (SUTURE) ×2
SUT VIC AB 3-0 SH 27XBRD (SUTURE) ×2 IMPLANT
TOWEL GREEN STERILE (TOWEL DISPOSABLE) ×3 IMPLANT

## 2022-02-22 NOTE — Anesthesia Procedure Notes (Addendum)
Anesthesia Regional Block: Pectoralis block  ? ?Pre-Anesthetic Checklist: , timeout performed,  Correct Patient, Correct Site, Correct Laterality,  Correct Procedure, Correct Position, site marked,  Risks and benefits discussed,  Surgical consent,  Pre-op evaluation,  At surgeon's request and post-op pain management ? ?Laterality: Left ? ?Prep: chloraprep     ?  ?Needles:  ?Injection technique: Single-shot ? ?Needle Type: Echogenic Needle   ? ? ?Needle Length: 9cm  ?Needle Gauge: 21  ? ? ? ?Additional Needles: ? ? ?Narrative:  ?Start time: 02/22/2022 12:11 PM ?End time: 02/22/2022 12:17 PM ?Injection made incrementally with aspirations every 5 mL. ? ?Performed by: Personally  ?Anesthesiologist: Albertha Ghee, MD ? ?Additional Notes: ?Pt tolerated the procedure well. ? ? ? ? ?

## 2022-02-22 NOTE — Op Note (Signed)
RIGHT MODIFIED RADICAL MASTECTOMY, LEFT TOTAL MASTECTOMY  Procedure Note ? ?Paulette Blanch ?02/22/2022 ? ? ?Pre-op Diagnosis: RIGHT BREAST CANCER ?    ?Post-op Diagnosis: same ? ?Procedure(s): ?RIGHT MODIFIED RADICAL MASTECTOMY ?LEFT TOTAL MASTECTOMY ? ?Surgeon(s): ?[name], MD ? ?Assistant: Vernell Leep, MD [facility] Resident ?Anesthesia: General ? ?Staff:  ?Circulator: Hal Morales, RN ?Relief Circulator: Rometta Emery, RN ?Scrub Person: Dollene Cleveland T ?Circulator Assistant: Hinda Glatter, RN ? ?Estimated Blood Loss: Minimal ?              ?Specimens: sent to path ? ?Indications: This is a 66 year old female who was found to have inflammatory right breast cancer.  She underwent neoadjuvant chemotherapy.  Follow-up MRI has shown almost complete response to chemotherapy.  The decision has been made to proceed with a right breast modified radical mastectomy and a left total mastectomy. ? ?Procedure: The patient was brought to operating identifies correct patient.  She is placed upon the operating table general anesthesia was induced.  Her bilateral breast, chest, and axilla were prepped and draped in usual sterile fashion.  We turned our attention toward the left breast first.  I made elliptical incision going medial to lateral on the left breast incorporating the nipple areolar complex.  I take this all the way toward the axilla.  I then dissected down to the breast tissue with electrocautery.  I then dissected out the superior skin flap first.  The patient had a Port-A-Cath in place which was down and was on the breast tissue.  I had to stay underneath this and then go down to the chest wall to keep the Port-A-Cath intact.  This was again done with the cautery.  I then took the flap more laterally going toward the axilla.  I then dissected out the inferior skin flap staying just underneath the skin and dermis going down toward the inframammary ridge.  I then dissected the breast tissue off of the  pectoralis fascia going medial to laterally with electrocautery.  Once the total mastectomy was completed I then marked the lateral margin with a suture.  The left mastectomy specimen was then sent to pathology for evaluation. ?We next turned our attention toward the right modified radical mastectomy.  We again made an elliptical incision from medial to lateral going around the nipple areolar complex more widely and then toward the axilla.  With that the dissection down to the breast tissue circumferentially with electrocautery.  The superior skin flap was again dissected toward the clavicle and down toward the chest wall.  We then dissected the inferior skin flap going down to the inframammary ridge staying just underneath the skin and dermis as well.  There was edema in the skin flaps on the inferior side.  We then took the dissection toward the axilla.  Once we reached the axilla we dissected just slightly into the axillary tissue.  We next dissected the breast off the pectoralis fashion moving medial to laterally with electrocautery.  Several small perforating vessels were controlled with cautery and surgical clips.  We then reached the level of the axilla.  The patient had only 1 large palpable lymph node.  We took the dissection of the breast past the pectoralis major and minor muscles and then tacked down toward the axilla.  We identified the long thoracic and thoracodorsal complexes which were left intact.  Several bridging vessels and lymphatics had to be clipped with surgical clips.  We completed dissection with the cautery removing the  nodal package.  We stay below the right axillary vein.  Once the mastectomy was complete as well as the axillary dissection we marked again the lateral margin with a suture.  We then sent this to pathology for evaluation.  I palpated the area further in the axilla as well as underneath the pectoralis major minor muscles and found no further lymph nodes.  At this point but  chest incision then irrigated with saline.  We made skin incisions and placed two 19 French Blake drains on the right side with 1 going into the axilla and the other going along the chest wall and on the left side placed 1 going along the chest wall.  These were all sewn in with nylon sutures.  We then closed both chest incisions with interrupted 3-0 Vicryl sutures and running 4-0 Monocryl sutures.  Dermabond was then applied.  Patient tolerated procedure well.  All the counts were correct at the end of the procedure.  The patient was then extubated in the operating room and taken in stable condition to the recovery room.  She had been placed in a breast binder as well. ?        ? ?[name]  ? ?Date: 02/22/2022  Time: 2:35 PM ? ? ? ?

## 2022-02-22 NOTE — Transfer of Care (Signed)
Immediate Anesthesia Transfer of Care Note ? ?Patient: Lynn Mcgee ? ?Procedure(s) Performed: RIGHT MODIFIED RADICAL MASTECTOMY (Right: Breast) ?LEFT TOTAL MASTECTOMY (Left: Breast) ? ?Patient Location: PACU ? ?Anesthesia Type:General ? ?Level of Consciousness: awake, alert , oriented and patient cooperative ? ?Airway & Oxygen Therapy: Patient Spontanous Breathing ? ?Post-op Assessment: Report given to RN and Post -op Vital signs reviewed and stable ? ?Post vital signs: Reviewed and stable ? ?Last Vitals:  ?Vitals Value Taken Time  ?BP 162/96 02/22/22 1500  ?Temp 36.4 ?C 02/22/22 1445  ?Pulse 72 02/22/22 1502  ?Resp 14 02/22/22 1502  ?SpO2 97 % 02/22/22 1502  ?Vitals shown include unvalidated device data. ? ?Last Pain:  ?Vitals:  ? 02/22/22 1445  ?PainSc: 0-No pain  ?   ? ?  ? ?Complications: No notable events documented. ?

## 2022-02-22 NOTE — Anesthesia Procedure Notes (Signed)
Procedure Name: LMA Insertion ?Date/Time: 02/22/2022 12:28 PM ?Performed by: Oletta Lamas, CRNA ?Pre-anesthesia Checklist: Patient identified, Emergency Drugs available, Suction available and Patient being monitored ?Patient Re-evaluated:Patient Re-evaluated prior to induction ?Oxygen Delivery Method: Circle System Utilized ?Preoxygenation: Pre-oxygenation with 100% oxygen ?Induction Type: IV induction ?Ventilation: Mask ventilation without difficulty ?LMA: LMA inserted ?LMA Size: 4.0 ?Number of attempts: 1 ?Placement Confirmation: positive ETCO2 ?Tube secured with: Tape ?Dental Injury: Teeth and Oropharynx as per pre-operative assessment  ? ? ? ? ?

## 2022-02-22 NOTE — Anesthesia Postprocedure Evaluation (Signed)
Anesthesia Post Note ? ?Patient: Lynn Mcgee ? ?Procedure(s) Performed: RIGHT MODIFIED RADICAL MASTECTOMY (Right: Breast) ?LEFT TOTAL MASTECTOMY (Left: Breast) ? ?  ? ?Patient location during evaluation: PACU ?Anesthesia Type: General ?Level of consciousness: awake ?Pain management: pain level controlled ?Vital Signs Assessment: post-procedure vital signs reviewed and stable ?Respiratory status: spontaneous breathing and respiratory function stable ?Cardiovascular status: stable ?Postop Assessment: no apparent nausea or vomiting ?Anesthetic complications: no ? ? ?No notable events documented. ? ?Last Vitals:  ?Vitals:  ? 02/22/22 1600 02/22/22 1615  ?BP: (!) 169/93 (!) 160/90  ?Pulse: 79 81  ?Resp: 15 15  ?Temp:    ?SpO2: 93% 91%  ?  ?Last Pain:  ?Vitals:  ? 02/22/22 1615  ?PainSc: 0-No pain  ? ? ?  ?  ?  ?  ?  ?  ? ?Merlinda Frederick ? ? ? ? ?

## 2022-02-22 NOTE — Anesthesia Procedure Notes (Addendum)
Anesthesia Regional Block: Pectoralis block  ? ?Pre-Anesthetic Checklist: , timeout performed,  Correct Patient, Correct Site, Correct Laterality,  Correct Procedure, Correct Position, site marked,  Risks and benefits discussed,  Surgical consent,  Pre-op evaluation,  At surgeon's request and post-op pain management ? ?Laterality: Right ? ?Prep: chloraprep     ?  ?Needles:  ?Injection technique: Single-shot ? ?Needle Type: Echogenic Needle   ? ? ?Needle Length: 9cm  ?Needle Gauge: 21  ? ? ? ?Additional Needles: ? ? ?Narrative:  ?Start time: 02/22/2022 12:01 PM ?End time: 02/22/2022 12:10 PM ?Injection made incrementally with aspirations every 5 mL. ? ?Performed by: Personally  ?Anesthesiologist: Albertha Ghee, MD ? ?Additional Notes: ?Pt tolerated the procedure well. ? ? ? ? ?

## 2022-02-22 NOTE — Interval H&P Note (Signed)
History and Physical Interval Note: no change in H and P ? ?02/22/2022 ?11:27 AM ? ?Lynn Mcgee  has presented today for surgery, with the diagnosis of RIGHT BREAST CANCER.  The various methods of treatment have been discussed with the patient and family. After consideration of risks, benefits and other options for treatment, the patient has consented to  Procedure(s): ?RIGHT MODIFIED RADICAL MASTECTOMY (Right) ?LEFT TOTAL MASTECTOMY (Left) as a surgical intervention.  The patient's history has been reviewed, patient examined, no change in status, stable for surgery.  I have reviewed the patient's chart and labs.  Questions were answered to the patient's satisfaction.   ? ? ?[name] ? ? ?

## 2022-02-22 NOTE — Progress Notes (Signed)
Patient arrived to [place] room 20 alert and oriented x4. Pain level 4/10. Bed in lowest position call light in reach. Family at bedside ?

## 2022-02-23 ENCOUNTER — Encounter (HOSPITAL_COMMUNITY): Payer: Self-pay | Admitting: Surgery

## 2022-02-23 DIAGNOSIS — C50911 Malignant neoplasm of unspecified site of right female breast: Secondary | ICD-10-CM | POA: Diagnosis not present

## 2022-02-23 MED ORDER — TRAMADOL HCL 50 MG PO TABS: 50.0000 mg | ORAL_TABLET | Freq: Four times a day (QID) | ORAL | 0 refills | Status: DC | PRN

## 2022-02-23 NOTE — Progress Notes (Signed)
Discharge instructions given to pt. Pt verbalized understanding of all teaching and had no further questions. Patient currently in room waiting for ride to pick her up ?

## 2022-02-23 NOTE — Progress Notes (Signed)
Pt's daughter would like [place] not to give her mother oxycodone. She states her mother is very anxious about receiving oxycodone and does not want it. Reports that tylenol is working and does not stronger pain medication at this time.  ?

## 2022-02-23 NOTE — Discharge Summary (Signed)
Physician Discharge Summary  ?Patient ID: ?Paulette Blanch ?MRN: [id number] ?DOB/AGE: [id number] 66 y.o. ? ?Admit date: 02/22/2022 ?Discharge date: 02/23/2022 ? ?Admission Diagnoses: ? ?Discharge Diagnoses:  ?Principal Problem: ?  S/P bilateral mastectomy ? ? ?Discharged Condition: good ? ?Hospital Course: uneventful postop course.  Discharged home POD#1 ? ?Consults: None ? ?Significant Diagnostic Studies:  ? ?Treatments: surgery: right modified radical mastectomy, left total mastectomy ? ?Discharge Exam: ?Blood pressure (!) 147/77, pulse 79, temperature 98.2 ?F (36.8 ?C), temperature source Oral, resp. rate 16, height '5\' 5"'$  (1.651 m), weight 50.3 kg, SpO2 95 %. ?General appearance: alert, cooperative, and no distress ?Resp: clear to auscultation bilaterally ?Cardio: regular rate and rhythm, S1, S2 normal, no murmur, click, rub or gallop ?Incision/Wound: skin flaps viable, no hematoma, drains serosang ? ?Disposition: Discharge disposition: 01-Home or Self Care ? ? ? ? ? ? ? ?Allergies as of 02/23/2022   ?No Known Allergies ?  ? ?  ?Medication List  ?  ? ?TAKE these medications   ? ?acetaminophen 500 MG tablet ?Commonly known as: TYLENOL ?Take 1,000 mg by mouth every 6 (six) hours as needed for mild pain. ?  ?amLODipine 5 MG tablet ?Commonly known as: NORVASC ?Take 1 tablet (5 mg total) by mouth daily. ?  ?atorvastatin 40 MG tablet ?Commonly known as: LIPITOR ?Take 40 mg by mouth daily. ?  ?carvedilol 6.25 MG tablet ?Commonly known as: COREG ?Take 1 tablet (6.25 mg total) by mouth 2 (two) times daily. ?  ?gabapentin 100 MG capsule ?Commonly known as: Neurontin ?Take 1 capsule (100 mg total) by mouth at bedtime. ?  ?hydrOXYzine 25 MG tablet ?Commonly known as: ATARAX ?Take 25 mg by mouth every 8 (eight) hours as needed for anxiety. ?  ?losartan 50 MG tablet ?Commonly known as: COZAAR ?Take 50 mg by mouth daily. ?  ?potassium chloride 20 MEQ packet ?Commonly known as: KLOR-CON ?Take 20 mEq by mouth daily. ?  ?spironolactone 25  MG tablet ?Commonly known as: ALDACTONE ?Take 1 tablet (25 mg total) by mouth daily. ?  ?traMADol 50 MG tablet ?Commonly known as: ULTRAM ?Take 1 tablet (50 mg total) by mouth every 6 (six) hours as needed for moderate pain or severe pain. ?  ? ?  ? ? Follow-up Information   ? ? [name], MD Follow up on 03/15/2022.   ?Specialty: General Surgery ?Why: call the office for the visit time ?Contact information: ?[address] ?[address] ?[place] [address] ?[phone] ? ? ?  ?  ? ?  ?  ? ?  ? ? ?Signed: ?[name] ?02/23/2022, 7:50 AM ? ? ?

## 2022-02-23 NOTE — Progress Notes (Signed)
10am morning meds given to patient before discharge as requested by patient ?

## 2022-02-23 NOTE — Progress Notes (Signed)
Patient taught how to empty JP drains and verbalized understanding of teaching before discharge. Patient also given extra containers for home to empty JP drains. ?

## 2022-02-23 NOTE — Discharge Instructions (Signed)
[facility] [phone]  MASTECTOMY: POST OP INSTRUCTIONS  Always review your discharge instruction sheet given to you by the facility where your surgery was performed. IF YOU HAVE DISABILITY OR FAMILY LEAVE FORMS, YOU MUST BRING THEM TO THE OFFICE FOR PROCESSING.   DO NOT GIVE THEM TO YOUR DOCTOR. A prescription for pain medication may be given to you upon discharge.  Take your pain medication as prescribed, if needed.  If narcotic pain medicine is not needed, then you may take acetaminophen (Tylenol) or ibuprofen (Advil) as needed. Take your usually prescribed medications unless otherwise directed. If you need a refill on your pain medication, please contact your pharmacy.  They will contact our office to request authorization.  Prescriptions will not be filled after 5pm or on week-ends. You should follow a light diet the first few days after arrival home, such as soup and crackers, etc.  Resume your normal diet the day after surgery. Most patients will experience some swelling and bruising on the chest and underarm.  Ice packs will help.  Swelling and bruising can take several days to resolve.  It is common to experience some constipation if taking pain medication after surgery.  Increasing fluid intake and taking a stool softener (such as Colace) will usually help or prevent this problem from occurring.  A mild laxative (Milk of Magnesia or Miralax) should be taken according to package instructions if there are no bowel movements after 48 hours. Unless discharge instructions indicate otherwise, leave your bandage dry and in place until your next appointment in 3-5 days.  You may take a limited sponge bath.  No tube baths or showers until the drains are removed.  You may have steri-strips (small skin tapes) in place directly over the incision.  These strips should be left on the skin for 7-10 days.  If your surgeon used skin glue on the incision, you may shower in 24 hours.   The glue will flake off over the next 2-3 weeks.  Any sutures or staples will be removed at the office during your follow-up visit. DRAINS:  If you have drains in place, it is important to keep a list of the amount of drainage produced each day in your drains.  Before leaving the hospital, you should be instructed on drain care.  Call our office if you have any questions about your drains. ACTIVITIES:  You may resume regular (light) daily activities beginning the next day--such as daily self-care, walking, climbing stairs--gradually increasing activities as tolerated.  You may have sexual intercourse when it is comfortable.  Refrain from any heavy lifting or straining until approved by your doctor. You may drive when you are no longer taking prescription pain medication, you can comfortably wear a seatbelt, and you can safely maneuver your car and apply brakes. RETURN TO WORK:  __________________________________________________________ You should see your doctor in the office for a follow-up appointment approximately 3-5 days after your surgery.  Your doctor's nurse will typically make your follow-up appointment when she calls you with your pathology report.  Expect your pathology report 2-3 business days after your surgery.  You may call to check if you do not hear from us after three days.   OTHER INSTRUCTIONS: ______________________________________________________________________________________________ ____________________________________________________________________________________________ WHEN TO CALL YOUR DOCTOR: Fever over 101.0 Nausea and/or vomiting Extreme swelling or bruising Continued bleeding from incision. Increased pain, redness, or drainage from the incision. The clinic staff is available to answer your questions during regular business hours.  Please don't hesitate   to call and ask to speak to one of the nurses for clinical concerns.  If you have a medical emergency, go to the  nearest emergency room or call 911.  A surgeon from [facility] is always on call at the hospital. ?[address] [place], [place], [place]  [address] ? [address], [place], [place]   [address] ?[phone] ? [phone] ? FAX [phone] ?Web site: [email or web address] [facility] [place] ?[phone] ? ?MASTECTOMY: POST OP INSTRUCTIONS ? ?Always review your discharge instruction sheet given to you by the facility where your surgery was performed. ?IF YOU HAVE DISABILITY OR FAMILY LEAVE FORMS, YOU MUST BRING THEM TO THE OFFICE FOR PROCESSING.   ?DO NOT GIVE THEM TO YOUR DOCTOR. ?A prescription for pain medication may be given to you upon discharge.  Take your pain medication as prescribed, if needed.  If narcotic pain medicine is not needed, then you may take acetaminophen (Tylenol) or ibuprofen (Advil) as needed. ?Take your usually prescribed medications unless otherwise directed. ?If you need a refill on your pain medication, please contact your pharmacy.  They will contact our office to request authorization.  Prescriptions will not be filled after 5pm or on week-ends. ?You should follow a light diet the first few days after arrival home, such as soup and crackers, etc.  Resume your normal diet the day after surgery. ?Most patients will experience some swelling and bruising on the chest and underarm.  Ice packs will help.  Swelling and bruising can take several days to resolve.  ?It is common to experience some constipation if taking pain medication after surgery.  Increasing fluid intake and taking a stool softener (such as Colace) will usually help or prevent this problem from occurring.  A mild laxative (Milk of Magnesia or Miralax) should be taken according to package instructions if there are no bowel movements after 48 hours. ?Unless discharge instructions indicate otherwise, leave your bandage dry and in place until your next appointment in 3-5 days.  You may take a limited  sponge bath.  No tube baths or showers until the drains are removed.  You may have steri-strips (small skin tapes) in place directly over the incision.  These strips should be left on the skin for 7-10 days.  If your surgeon used skin glue on the incision, you may shower in 24 hours.  The glue will flake off over the next 2-3 weeks.  Any sutures or staples will be removed at the office during your follow-up visit. ?DRAINS:  If you have drains in place, it is important to keep a list of the amount of drainage produced each day in your drains.  Before leaving the hospital, you should be instructed on drain care.  Call our office if you have any questions about your drains. ?ACTIVITIES:  You may resume regular (light) daily activities beginning the next day--such as daily self-care, walking, climbing stairs--gradually increasing activities as tolerated.  You may have sexual intercourse when it is comfortable.  Refrain from any heavy lifting or straining until approved by your doctor. ?You may drive when you are no longer taking prescription pain medication, you can comfortably wear a seatbelt, and you can safely maneuver your car and apply brakes. ?RETURN TO WORK:  __________________________________________________________ ?You should see your doctor in the office for a follow-up appointment approximately 3-5 days after your surgery.  Your doctor?s nurse will typically make your follow-up appointment when she calls you with your pathology report.  Expect  your pathology report 2-3 business days after your surgery.  You may call to check if you do not hear from [place] after three days.   ?OTHER INSTRUCTIONS: OK TO SHOWER EVEN WITH DRAINS IN ?______________________________________________________________________________________________ ____________________________________________________________________________________________ ?WHEN TO CALL YOUR DOCTOR: ?Fever over 101.0 ?Nausea and/or vomiting ?Extreme swelling or  bruising ?Continued bleeding from incision. ?Increased pain, redness, or drainage from the incision. ?The clinic staff is available to answer your questions during regular business hours.  Please don?t hesitate to c

## 2022-02-24 LAB — SURGICAL PATHOLOGY

## 2022-02-28 ENCOUNTER — Encounter (HOSPITAL_COMMUNITY): Payer: Self-pay | Admitting: Internal Medicine

## 2022-02-28 ENCOUNTER — Ambulatory Visit (HOSPITAL_BASED_OUTPATIENT_CLINIC_OR_DEPARTMENT_OTHER)
Admission: RE | Admit: 2022-02-28 | Discharge: 2022-02-28 | Disposition: A | Discharge status: Home / Self Care | Payer: Medicare HMO | Source: Ambulatory Visit | Attending: Internal Medicine | Admitting: Internal Medicine

## 2022-02-28 ENCOUNTER — Ambulatory Visit (HOSPITAL_COMMUNITY)
Admission: RE | Admit: 2022-02-28 | Discharge: 2022-02-28 | Disposition: A | Discharge status: Home / Self Care | Payer: Medicare HMO | Source: Ambulatory Visit | Attending: Internal Medicine | Admitting: Internal Medicine

## 2022-02-28 VITALS — BP 124/70 | HR 59 | Wt 115.2 lb

## 2022-02-28 DIAGNOSIS — Z9013 Acquired absence of bilateral breasts and nipples: Secondary | ICD-10-CM | POA: Diagnosis not present

## 2022-02-28 DIAGNOSIS — F1290 Cannabis use, unspecified, uncomplicated: Secondary | ICD-10-CM | POA: Insufficient documentation

## 2022-02-28 DIAGNOSIS — F17210 Nicotine dependence, cigarettes, uncomplicated: Secondary | ICD-10-CM | POA: Insufficient documentation

## 2022-02-28 DIAGNOSIS — Z171 Estrogen receptor negative status [ER-]: Secondary | ICD-10-CM | POA: Insufficient documentation

## 2022-02-28 DIAGNOSIS — Z720 Tobacco use: Secondary | ICD-10-CM

## 2022-02-28 DIAGNOSIS — C50411 Malignant neoplasm of upper-outer quadrant of right female breast: Secondary | ICD-10-CM | POA: Diagnosis not present

## 2022-02-28 DIAGNOSIS — I110 Hypertensive heart disease with heart failure: Secondary | ICD-10-CM | POA: Insufficient documentation

## 2022-02-28 DIAGNOSIS — Z79899 Other long term (current) drug therapy: Secondary | ICD-10-CM | POA: Insufficient documentation

## 2022-02-28 DIAGNOSIS — I5022 Chronic systolic (congestive) heart failure: Secondary | ICD-10-CM | POA: Insufficient documentation

## 2022-02-28 DIAGNOSIS — I10 Essential (primary) hypertension: Secondary | ICD-10-CM | POA: Diagnosis not present

## 2022-02-28 DIAGNOSIS — Z9221 Personal history of antineoplastic chemotherapy: Secondary | ICD-10-CM | POA: Insufficient documentation

## 2022-02-28 NOTE — Patient Instructions (Signed)
Your physician has requested that you have an echocardiogram. Echocardiography is a painless test that uses sound waves to create images of your heart. It provides your doctor with information about the size and shape of your heart and how well your heart?s chambers and valves are working. This procedure takes approximately one hour. There are no restrictions for this procedure. IN 2 WEEKS ? ?Your physician recommends that you schedule a follow-up appointment in: 3-4 months with an echocardiogram ? ?If you have any questions or concerns before your next appointment please send [place] a message through [place] or call our office at [phone].   ? ?TO LEAVE A MESSAGE FOR THE NURSE SELECT OPTION 2, PLEASE LEAVE A MESSAGE INCLUDING: ?YOUR NAME ?DATE OF BIRTH ?CALL BACK NUMBER ?REASON FOR CALL**this is important as we prioritize the call backs ? ?YOU WILL RECEIVE A CALL BACK THE SAME DAY AS LONG AS YOU CALL BEFORE 4:00 PM ? ?At the [facility], you and your health needs are our priority. As part of our continuing mission to provide you with exceptional heart care, we have created designated Provider Care Teams. These Care Teams include your primary Cardiologist (physician) and Advanced Practice Providers (APPs- Physician Assistants and Nurse Practitioners) who all work together to provide you with the care you need, when you need it.  ? ?You may see any of the following providers on your designated Care Team at your next follow up: ?Dr Glori Bickers ?Dr Loralie Champagne ?Darrick Grinder, NP ?Lyda Jester, PA ?Jessica Milford,NP ?Marlyce Huge, PA ?Audry Riles, PharmD ? ? ?Please be sure to bring in all your medications bottles to every appointment.  ? ? ?

## 2022-02-28 NOTE — Progress Notes (Signed)
? ?ADVANCED HF CLINIC  NOTE ? ?Referring Physician: Dr Lindi Adie  ?Primary Care:Dr Osei Bonsu ?Primary Cardiologist: none  ?Oncology: Dr Lindi Adie.  ? ?HPI: ?Lynn Mcgee is a 66 year old with history of HTN, smoker, breast cancer, chronic systolic HF ? ?Diagnosed with HF in 10/21 at [facility]. ?- Echo 10/21 ([place] [facility]) EF 40-45% ?- Myoview 10/21 EF 36% ? anterior wall defect -> treated medically  ? ?Subsequently found to have breast CA. Followed by Dr Lindi Adie - Stage IIIB (cT4d, cN1, cM0, G3, ER-, PR-, HER2+).  ? ?Pre-chemo echo 10/22: EF 45-50% , Grade I DD RV normal  ? ?Started Docetaxel + Carboplatin + Trastuzumab + Pertuzumab  (TCHP) q21d on 09/23/21.  ? ?Scans on 09/28/21 no sign of metastatic disease.  ? ?Echo 11/09/21; EF 45-50% [facility]. [name] was increased to 25 mg daily  ? ?Returns today for f/u. Has finished chemo. Now getting Herceptin/Perjeta. Had bilateral mastectomy last week. Path was negative.  Chest sore but otherwise doing ok. Still smoking 10 cigarettes/day  ? ? ?SH: smokes 10 cigarettes per day. No alcohol. Occasionally smokes marijuana.  ? ?FH: Brother heart failure. Mom HTN ? ? ?Past Medical History:  ?Diagnosis Date  ? Cancer [facility])   ? right breast cancer  ? CHF (congestive heart failure) ([place])   ? per patient  ? Heart murmur   ? pt states MD stated she had heart murmur "years ago"  ? Hypertension   ? ? ?Current Outpatient Medications  ?Medication Sig Dispense Refill  ? acetaminophen (TYLENOL) 500 MG tablet Take 1,000 mg by mouth every 6 (six) hours as needed for mild pain.    ? amLODipine (NORVASC) 5 MG tablet Take 1 tablet (5 mg total) by mouth daily. 90 tablet 3  ? atorvastatin (LIPITOR) 40 MG tablet Take 40 mg by mouth daily.    ? carvedilol (COREG) 6.25 MG tablet Take 1 tablet (6.25 mg total) by mouth 2 (two) times daily. 60 tablet 3  ? gabapentin (NEURONTIN) 100 MG capsule Take 1 capsule (100 mg total) by mouth at bedtime. 30 capsule 0  ? hydrOXYzine (ATARAX) 25 MG tablet Take 25 mg by mouth  every 8 (eight) hours as needed for anxiety.    ? losartan (COZAAR) 50 MG tablet Take 50 mg by mouth daily.    ? potassium chloride (KLOR-CON) 20 MEQ packet Take 20 mEq by mouth daily. 30 packet 1  ? spironolactone (ALDACTONE) 25 MG tablet Take 1 tablet (25 mg total) by mouth daily. 30 tablet 6  ? traMADol (ULTRAM) 50 MG tablet Take 1 tablet (50 mg total) by mouth every 6 (six) hours as needed for moderate pain or severe pain. 30 tablet 0  ? ?No current facility-administered medications for this encounter.  ? ? ?No Known Allergies ? ?  ?Social History  ? ?Socioeconomic History  ? Marital status: Single  ?  Spouse name: Not on file  ? Number of children: Not on file  ? Years of education: Not on file  ? Highest education level: Not on file  ?Occupational History  ? Not on file  ?Tobacco Use  ? Smoking status: Every Day  ?  Packs/day: 0.50  ?  Years: 2.00  ?  Pack years: 1.00  ?  Types: Cigarettes  ? Smokeless tobacco: Never  ?Vaping Use  ? Vaping Use: Never used  ?Substance and Sexual Activity  ? Alcohol use: Not Currently  ?  Comment: "way back"  ? Drug use: Yes  ?  Types: Marijuana  ?  Comment: "occasionally" every couple of months  ? Sexual activity: Not on file  ?Other Topics Concern  ? Not on file  ?Social History Narrative  ? Not on file  ? ?Social Determinants of Health  ? ?Financial Resource Strain: Not on file  ?Food Insecurity: Not on file  ?Transportation Needs: Not on file  ?Physical Activity: Not on file  ?Stress: Not on file  ?Social Connections: Not on file  ?Intimate Partner Violence: Not on file  ? ? ? ?Vitals:  ? 02/28/22 1348  ?BP: 124/70  ?Pulse: (!) 59  ?SpO2: 99%  ?Weight: 52.3 kg (115 lb 3.2 oz)  ? ? ? ?PHYSICAL EXAM: ?General:  Elderly thin No resp difficulty ?HEENT: normal ?Neck: supple. no JVD. Carotids 2+ bilat; no bruits. No lymphadenopathy or thryomegaly appreciated. ?Cor: PMI nondisplaced. Regular rate & rhythm. No rubs, gallops or murmurs. + dressing ?Lungs: clear ?Abdomen: soft,  nontender, nondistended. No hepatosplenomegaly. No bruits or masses. Good bowel sounds. ?Extremities: no cyanosis, clubbing, rash, edema ?Neuro: alert & orientedx3, cranial nerves grossly intact. moves all 4 extremities w/o difficulty. Affect pleasant ? ? ?ASSESSMENT & PLAN: ? ?1. Chronic systolic HF ?- Echo 67/61 ([place] [facility]) EF 40-45% ?- Myoview 10/21 EF 36% ? anterior wall defect -> treated medically ?- CT chest  10/22 + 3v coronary calcium ?- Echo 10/22 (pre-chemo) EF 45-50% No RWMA  ?- Stable NYHA I-II Volume status stable. No diuretics ?- Continue losartan100 mg daily ?- Continue spiro 25 mg daily ?- Continue carvedilol at 6.25 mg  bid ?- LV dysfunction predated chemotherapy. 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 Does have evidence of CAD but no ischemic symptoms or regional wall motion abnormalities. ?- Will need echo. Had to be rescheduled today due to recent surgery. Will plan to do it in 2 weeks.  ?- Focus on aggressive medical therapy with GDMT and tight control of BP. Repeat echo pending. If EF worsening or develops ischemic symptoms will need cath.  ? ?2. Breast Cancer  ?- Stage IIIB (cT4d, cN1, cM0, G3, ER-, PR-, HER2+)  ?- Had Echo 09/22/21 EF 45-50% -->Pre chemo ?- Started on  Docetaxel + Carboplatin + Trastuzumab + Pertuzumab  (TCHP) q21d  on 09/23/21 ?- Echo 11/09/21 EF 50-55%  ?- Echo pending as above ? ?3. HTN  ?-Blood pressure well controlled. Continue current regimen. ? ?4. Tobacco Abuse ?- discussed need for cessation ? ?Glori Bickers, MD  ?2:21 PM ? ?

## 2022-03-01 ENCOUNTER — Encounter: Payer: Self-pay | Admitting: *Deleted

## 2022-03-01 ENCOUNTER — Inpatient Hospital Stay: Payer: Medicare HMO | Attending: Hematology and Oncology | Admitting: Hematology and Oncology

## 2022-03-01 ENCOUNTER — Telehealth: Payer: Self-pay | Admitting: Hematology and Oncology

## 2022-03-01 ENCOUNTER — Other Ambulatory Visit: Payer: Self-pay

## 2022-03-01 DIAGNOSIS — C50411 Malignant neoplasm of upper-outer quadrant of right female breast: Secondary | ICD-10-CM | POA: Insufficient documentation

## 2022-03-01 DIAGNOSIS — Z5112 Encounter for antineoplastic immunotherapy: Secondary | ICD-10-CM | POA: Insufficient documentation

## 2022-03-01 DIAGNOSIS — Z171 Estrogen receptor negative status [ER-]: Secondary | ICD-10-CM | POA: Diagnosis not present

## 2022-03-01 NOTE — Telephone Encounter (Signed)
.  Called patient to schedule appointment per 4/4, patient daughter is aware of date and time.   ?

## 2022-03-01 NOTE — Assessment & Plan Note (Signed)
09/10/2021 right breast thickening and swelling: Mammogram revealed extremely hard right breast with peau d'orange skin changes, ultrasound revealed very large irregular mass measuring 11 cm with diffuse skin thickening, 3 abnormal right axillary lymph nodes, biopsy revealed grade 3 invasive mammary cancer with pleomorphic features, lymph node positive, ER 0%, PR 0%, HER2 3+, Ki-67 30% ?Stage IIIb ?? ?Treatment plan: ?1. Neoadjuvant chemotherapy with?TCHP ?2. 02/22/2022:Bilateral mastectomies: Pathologic complete response, 6 lymph nodes negative ?3. 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 I discussed the margins as well as lymph node surgeries. We also discussed the final staging along with previously performed ER/PR and HER-2/neu testing. ? ?Treatment plan: Adjuvant radiation followed by surveillance ?Herceptin Perjeta maintenance ? ?Return to clinic every 3 weeks for Herceptin Perjeta and every 6 weeks for follow-up with me. ? ? ?

## 2022-03-01 NOTE — Progress Notes (Signed)
? ?Patient Care Team: ?Pcp, No as PCP - General ?Nicholas Lose, MD as Consulting Physician (Hematology and Oncology) ?Rockwell Germany, RN as Oncology Nurse Navigator ?Mauro Kaufmann, RN as Oncology Nurse Navigator ? ?DIAGNOSIS:  ?Encounter Diagnosis  ?Name Primary?  ? Malignant neoplasm of upper-outer quadrant of right breast in female, estrogen receptor negative ([place])   ? ? ?SUMMARY OF ONCOLOGIC HISTORY: ?Oncology History  ?Malignant neoplasm of upper-outer quadrant of right breast in female, estrogen receptor negative ([employer])  ?09/10/2021 Initial Diagnosis  ? Right breast thickening and swelling: Mammogram revealed extremely hard right breast with peau d'orange skin changes, ultrasound revealed very large irregular mass measuring 11 cm with diffuse skin thickening, 3 abnormal right axillary lymph nodes, biopsy revealed grade 3 invasive mammary cancer with pleomorphic features, lymph node positive, ER 0%, PR 0%, HER2 3+, Ki-67 30% ?  ?09/16/2021 Cancer Staging  ? Staging form: Breast, AJCC 8th Edition ?- Clinical stage from 09/16/2021: Stage IIIB (cT4d, cN1, cM0, G3, ER-, PR-, HER2+) - Signed by Nicholas Lose, MD on 09/16/2021 ?Stage prefix: Initial diagnosis ?Histologic grading system: 3 grade system ? ?  ?09/24/2021 - 01/17/2022 Chemotherapy  ? Patient is on Treatment Plan : BREAST  Docetaxel + Carboplatin + Trastuzumab + Pertuzumab  (TCHP) q21d   ?   ?02/04/2022 -  Chemotherapy  ? Patient is on Treatment Plan : BREAST Trastuzumab + Pertuzumab q21d  ?   ?02/22/2022 Surgery  ? Bilateral mastectomies: Pathologic complete response, 6 lymph nodes negative ?  ? ? ?CHIEF COMPLIANT: Discuss Pathology report after bilateral mastectomies ? ?INTERVAL HISTORY: Almetta Liddicoat is a 66 y.o. with above-mentioned history of right breast cancer, completed chemotherapy with TCHP. She presents to the clinic today for treatment.  She reports no major pain or discomfort but she currently has drains in place.  She is recovering and healing  very well from recent surgeries. ? ? ?ALLERGIES:  has No Known Allergies. ? ?MEDICATIONS:  ?Current Outpatient Medications  ?Medication Sig Dispense Refill  ? acetaminophen (TYLENOL) 500 MG tablet Take 1,000 mg by mouth every 6 (six) hours as needed for mild pain.    ? amLODipine (NORVASC) 5 MG tablet Take 1 tablet (5 mg total) by mouth daily. 90 tablet 3  ? atorvastatin (LIPITOR) 40 MG tablet Take 40 mg by mouth daily.    ? carvedilol (COREG) 6.25 MG tablet Take 1 tablet (6.25 mg total) by mouth 2 (two) times daily. 60 tablet 3  ? gabapentin (NEURONTIN) 100 MG capsule Take 1 capsule (100 mg total) by mouth at bedtime. 30 capsule 0  ? hydrOXYzine (ATARAX) 25 MG tablet Take 25 mg by mouth every 8 (eight) hours as needed for anxiety.    ? losartan (COZAAR) 50 MG tablet Take 50 mg by mouth daily.    ? potassium chloride (KLOR-CON) 20 MEQ packet Take 20 mEq by mouth daily. 30 packet 1  ? spironolactone (ALDACTONE) 25 MG tablet Take 1 tablet (25 mg total) by mouth daily. 30 tablet 6  ? traMADol (ULTRAM) 50 MG tablet Take 1 tablet (50 mg total) by mouth every 6 (six) hours as needed for moderate pain or severe pain. 30 tablet 0  ? ?No current facility-administered medications for this visit.  ? ? ?PHYSICAL EXAMINATION: ?ECOG PERFORMANCE STATUS: 1 - Symptomatic but completely ambulatory ? ?Vitals:  ? 03/01/22 1444  ?BP: (!) 166/85  ?Pulse: 85  ?Resp: 19  ?Temp: 97.8 ?F (36.6 ?C)  ?SpO2: 98%  ? ?Filed Weights  ? 03/01/22  1444  ?Weight: 117 lb 9.6 oz (53.3 kg)  ?  ? ?LABORATORY DATA:  ?I have reviewed the data as listed ? ?  Latest Ref Rng & Units 02/15/2022  ? 11:31 AM 01/14/2022  ? 10:02 AM 12/24/2021  ?  9:45 AM  ?CMP  ?Glucose 70 - 99 mg/dL 107   103   88    ?BUN 8 - 23 mg/dL 12   15   8     ?Creatinine 0.44 - 1.00 mg/dL 0.62   0.54   0.50    ?Sodium 135 - 145 mmol/L 137   139   142    ?Potassium 3.5 - 5.1 mmol/L 4.0   3.3   2.7    ?Chloride 98 - 111 mmol/L 105   105   104    ?CO2 22 - 32 mmol/L 22   26   30     ?Calcium 8.9  - 10.3 mg/dL 9.1   8.8   8.2    ?Total Protein 6.5 - 8.1 g/dL  6.7   6.3    ?Total Bilirubin 0.3 - 1.2 mg/dL  0.5   0.4    ?Alkaline Phos 38 - 126 U/L  83   81    ?AST 15 - 41 U/L  8   9    ?ALT 0 - 44 U/L  5   5    ? ? ?Lab Results  ?Component Value Date  ? WBC 6.0 02/15/2022  ? HGB 9.9 (L) 02/15/2022  ? HCT 32.4 (L) 02/15/2022  ? MCV 83.9 02/15/2022  ? PLT 554 (H) 02/15/2022  ? NEUTROABS 7.0 01/14/2022  ? ? ?ASSESSMENT & PLAN:  ?Malignant neoplasm of upper-outer quadrant of right breast in female, estrogen receptor negative ([place]) ?09/10/2021 right breast thickening and swelling: Mammogram revealed extremely hard right breast with peau d'orange skin changes, ultrasound revealed very large irregular mass measuring 11 cm with diffuse skin thickening, 3 abnormal right axillary lymph nodes, biopsy revealed grade 3 invasive mammary cancer with pleomorphic features, lymph node positive, ER 0%, PR 0%, HER2 3+, Ki-67 30% ?Stage IIIb ?  ?Treatment plan: ?1. Neoadjuvant chemotherapy with TCHP ?2. 02/22/2022:Bilateral mastectomies: Pathologic complete response, 6 lymph nodes negative ?3. 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 I discussed the margins as well as lymph node surgeries. We also discussed the final staging along with previously performed ER/PR and HER-2/neu testing. ? ?Treatment plan: Adjuvant radiation followed by surveillance ?Herceptin Perjeta maintenance ?Echocardiogram has been rescheduled for 03/21/2022. ? ?Return to clinic every 3 weeks for Herceptin  Perjeta and every 6 weeks for follow-up with me. ? ? ? ? ?No orders of the defined types were placed in this encounter. ? ?The patient has a good understanding of the overall plan. she agrees with it. she will call with any problems that may develop before the next visit here. ?Total time spent: 30 mins including face to face time and time spent for planning, charting and co-ordination of care ? ? Harriette Ohara, MD ?03/01/22 ? ? ? I Gardiner Coins am scribing for Dr. Lindi Adie ? ?I have reviewed the above documentation for accuracy and completeness, and I agree with the above. ?  ?

## 2022-03-02 ENCOUNTER — Encounter: Payer: Self-pay | Admitting: Hematology and Oncology

## 2022-03-04 ENCOUNTER — Encounter: Payer: Self-pay | Admitting: *Deleted

## 2022-03-09 NOTE — Progress Notes (Signed)
Location of Breast Cancer: upper-outer quadrant of right breast  ? ?Histology per Pathology Report:  ?A.BREAST, LEFT, MASTECTOMY:  ?(Weight: 299 gms.)  ?Focal fibrocystic changes and fibrosclerosis without epithelial  ?hyperplasia.  ?Isolated foci of dystrophic calcification in the intramammary arteries.  ?Negative for atypical ductal hyperplasia, atypical lobular hyperplasia,  ?DCIS and invasive carcinoma.  ? ?B. BREAST, RIGHT, MODIFIED RADICAL MASTECTOMY POST NEOADJUVANT  ?CHEMOTHERAPY, WITH LYMPH NODES:  ?(Weight: 484 gms.)  ?Fibrous scar in the breast without any identifiable residual carcinoma.  ?Extensive dystrophic microcalcification in the intramammary arteries.  ?Six axillary lymph nodes, some with focal fibrous scarring and all  ?negative for metastatic carcinoma.  ? ?Receptor Status:  ?The tumor cells are POSITIVE for Her2 (3+). ?Estrogen Receptor: 0%, NEGATIVE ?Progesterone Receptor: <1%, NEGATIVE ?Proliferation Marker Ki67: 30% ? ?Did patient present with symptoms (if so, please note symptoms) or was this found on screening mammography?: Right breast thickening and swelling: Mammogram revealed extremely hard right breast with peau d'orange skin changes ? ?Past/Anticipated interventions by surgeon, if any:  ?02/22/2022  ?Procedure(s): ?RIGHT MODIFIED RADICAL MASTECTOMY ?LEFT TOTAL MASTECTOMY ?Surgeon(s): ?[name], MD ? ?Past/Anticipated interventions by medical oncology, if any: Chemotherapy Dr Lindi Adie ?09/24/2021 - 01/17/2022 Chemotherapy  ?  Patient is on Treatment Plan : BREAST  Docetaxel + Carboplatin + Trastuzumab + Pertuzumab  (TCHP) q21d   ?   ?02/04/2022 -  Chemotherapy  ?  Patient is on Treatment Plan : BREAST Trastuzumab + Pertuzumab q21d  ?   ? ? ?Lymphedema issues, if any:  no   ? ?Pain issues, if any:   soreness from surgery in right breast ? ?SAFETY ISSUES: ?Prior radiation? no ?Pacemaker/ICD? no ?Possible current pregnancy?no, hysterectomy ?Is the patient on methotrexate?  no ? ?Current Complaints / other details:  nothing of note ?   ?Vitals:  ? 03/17/22 1233  ?BP: (!) 178/94  ?Pulse: 73  ?Resp: 18  ?Temp: (!) 96.9 ?F (36.1 ?C)  ?TempSrc: Temporal  ?SpO2: 100%  ?Weight: 113 lb 4 oz (51.4 kg)  ?Height: _0  (1.651 m)  ? ? ? ? ?

## 2022-03-11 ENCOUNTER — Inpatient Hospital Stay: Payer: Medicare HMO

## 2022-03-11 ENCOUNTER — Other Ambulatory Visit: Payer: Self-pay

## 2022-03-11 VITALS — BP 177/87 | HR 72 | Temp 99.0°F | Resp 18 | Wt 113.2 lb

## 2022-03-11 DIAGNOSIS — Z171 Estrogen receptor negative status [ER-]: Secondary | ICD-10-CM

## 2022-03-11 DIAGNOSIS — Z5112 Encounter for antineoplastic immunotherapy: Secondary | ICD-10-CM | POA: Diagnosis not present

## 2022-03-11 LAB — CBC WITH DIFFERENTIAL (CANCER CENTER ONLY)
Abs Immature Granulocytes: 0.02 10*3/uL (ref 0.00–0.07)
Basophils Absolute: 0 10*3/uL (ref 0.0–0.1)
Basophils Relative: 1 %
Eosinophils Absolute: 0.3 10*3/uL (ref 0.0–0.5)
Eosinophils Relative: 5 %
HCT: 32.1 % — ABNORMAL LOW (ref 36.0–46.0)
Hemoglobin: 10.2 g/dL — ABNORMAL LOW (ref 12.0–15.0)
Immature Granulocytes: 0 %
Lymphocytes Relative: 33 %
Lymphs Abs: 2.1 10*3/uL (ref 0.7–4.0)
MCH: 25.4 pg — ABNORMAL LOW (ref 26.0–34.0)
MCHC: 31.8 g/dL (ref 30.0–36.0)
MCV: 79.9 fL — ABNORMAL LOW (ref 80.0–100.0)
Monocytes Absolute: 0.6 10*3/uL (ref 0.1–1.0)
Monocytes Relative: 9 %
Neutro Abs: 3.4 10*3/uL (ref 1.7–7.7)
Neutrophils Relative %: 52 %
Platelet Count: 346 10*3/uL (ref 150–400)
RBC: 4.02 MIL/uL (ref 3.87–5.11)
RDW: 15.8 % — ABNORMAL HIGH (ref 11.5–15.5)
WBC Count: 6.5 10*3/uL (ref 4.0–10.5)
nRBC: 0 % (ref 0.0–0.2)

## 2022-03-11 LAB — CMP (CANCER CENTER ONLY)
ALT: 6 U/L (ref 0–44)
AST: 14 U/L — ABNORMAL LOW (ref 15–41)
Albumin: 3.8 g/dL (ref 3.5–5.0)
Alkaline Phosphatase: 95 U/L (ref 38–126)
Anion gap: 7 (ref 5–15)
BUN: 17 mg/dL (ref 8–23)
CO2: 23 mmol/L (ref 22–32)
Calcium: 8.9 mg/dL (ref 8.9–10.3)
Chloride: 110 mmol/L (ref 98–111)
Creatinine: 0.73 mg/dL (ref 0.44–1.00)
GFR, Estimated: >60 mL/min (ref >60)
Glucose, Bld: 111 mg/dL — ABNORMAL HIGH (ref 70–99)
Potassium: 3.7 mmol/L (ref 3.5–5.1)
Sodium: 140 mmol/L (ref 135–145)
Total Bilirubin: 0.4 mg/dL (ref 0.3–1.2)
Total Protein: 7 g/dL (ref 6.5–8.1)

## 2022-03-11 MED ORDER — ACETAMINOPHEN 325 MG PO TABS
650.0000 mg | ORAL_TABLET | Freq: Once | ORAL | Status: AC
  Administered 2022-03-11: 650 mg via ORAL
  Filled 2022-03-11: qty 2

## 2022-03-11 MED ORDER — SODIUM CHLORIDE 0.9 % IV SOLN: Freq: Once | INTRAVENOUS | Status: AC

## 2022-03-11 MED ORDER — DIPHENHYDRAMINE HCL 25 MG PO CAPS
50.0000 mg | ORAL_CAPSULE | Freq: Once | ORAL | Status: AC
  Administered 2022-03-11: 50 mg via ORAL
  Filled 2022-03-11: qty 2

## 2022-03-11 MED ORDER — SODIUM CHLORIDE 0.9 % IV SOLN
420.0000 mg | Freq: Once | INTRAVENOUS | Status: AC
  Administered 2022-03-11: 420 mg via INTRAVENOUS
  Filled 2022-03-11: qty 14

## 2022-03-11 MED ORDER — SODIUM CHLORIDE 0.9% FLUSH: 10.0000 mL | Freq: Once | INTRAVENOUS | Status: DC

## 2022-03-11 MED ORDER — TRASTUZUMAB-ANNS CHEMO 150 MG IV SOLR
6.0000 mg/kg | Freq: Once | INTRAVENOUS | Status: AC
  Administered 2022-03-11: 315 mg via INTRAVENOUS
  Filled 2022-03-11: qty 15

## 2022-03-11 NOTE — Patient Instructions (Signed)
[facility]  Discharge Instructions: ?Thank you for choosing [facility] to provide your oncology and hematology care.  ? ?If you have a lab appointment with the [facility], please go directly to the [facility] and check in at the registration area. ?  ?Wear comfortable clothing and clothing appropriate for easy access to any Portacath or PICC line.  ? ?We strive to give you quality time with your provider. You may need to reschedule your appointment if you arrive late (15 or more minutes).  Arriving late affects you and other patients whose appointments are after yours.  Also, if you miss three or more appointments without notifying the office, you may be dismissed from the clinic at the provider?s discretion.    ?  ?For prescription refill requests, have your pharmacy contact our office and allow 72 hours for refills to be completed.   ? ?Today you received the following chemotherapy and/or immunotherapy agents: Herceptin/perjeta    ?  ?To help prevent nausea and vomiting after your treatment, we encourage you to take your nausea medication as directed. ? ?BELOW ARE SYMPTOMS THAT SHOULD BE REPORTED IMMEDIATELY: ?*FEVER GREATER THAN 100.4 F (38 ?C) OR HIGHER ?*CHILLS OR SWEATING ?*NAUSEA AND VOMITING THAT IS NOT CONTROLLED WITH YOUR NAUSEA MEDICATION ?*UNUSUAL SHORTNESS OF BREATH ?*UNUSUAL BRUISING OR BLEEDING ?*URINARY PROBLEMS (pain or burning when urinating, or frequent urination) ?*BOWEL PROBLEMS (unusual diarrhea, constipation, pain near the anus) ?TENDERNESS IN MOUTH AND THROAT WITH OR WITHOUT PRESENCE OF ULCERS (sore throat, sores in mouth, or a toothache) ?UNUSUAL RASH, SWELLING OR PAIN  ?UNUSUAL VAGINAL DISCHARGE OR ITCHING  ? ?Items with * indicate a potential emergency and should be followed up as soon as possible or go to the Emergency Department if any problems should occur. ? ?Please show the CHEMOTHERAPY ALERT CARD or IMMUNOTHERAPY ALERT CARD at  check-in to the Emergency Department and triage nurse. ? ?Should you have questions after your visit or need to cancel or reschedule your appointment, please contact [facility]  Dept: [phone]  and follow the prompts.  Office hours are 8:00 a.m. to 4:30 p.m. Monday - Friday. Please note that voicemails left after 4:00 p.m. may not be returned until the following business day.  We are closed weekends and major holidays. You have access to a nurse at all times for urgent questions. Please call the main number to the clinic Dept: [phone] and follow the prompts. ? ? ?For any non-urgent questions, you may also contact your provider using MyChart. We now offer e-Visits for anyone 66 and older to request care online for non-urgent symptoms. For details visit [email or web address]. ?  ?Also download the MyChart app! Go to the app store, search "MyChart", open the app, select [facility], and log in with your MyChart username and password. ? ?Due to Covid, a mask is required upon entering the hospital/clinic. If you do not have a mask, one will be given to you upon arrival. For doctor visits, patients may have 1 support person aged 66 or older with them. For treatment visits, patients cannot have anyone with them due to current Covid guidelines and our immunocompromised population.  ? ?

## 2022-03-11 NOTE — Progress Notes (Signed)
Patient arrived in flush room and was concerned about accessing her port due to the proximity of her port to her incision the doctors nurse was contacted and the to have her labs to be drawn from her arm and her treatment to be given via her arm by IV. Her infusion nurse was contacted and she was sent to the lab to draw her labs.     ?

## 2022-03-11 NOTE — Progress Notes (Signed)
Due to patient's recent mastectomy procedure, port will not be used today to prevent damage to incision site. Patient agreed to have labs drawn peripherally and PIV started for herceptin/perjeta infusion.  ? ?Patient declined to stay for 30 minute post perjeta observation. Patient in no distress upon leaving infusion clinic.  ?

## 2022-03-16 NOTE — Progress Notes (Signed)
?Radiation Oncology         [phone] ?________________________________ ? ?Initial Outpatient Consultation ? ?Name: Lynn Mcgee MRN: [id number]  ?Date: 03/17/2022  DOB: [birth year] ? ?CC:Pcp, No  Nicholas Lose, MD  ? ?REFERRING PHYSICIAN: Nicholas Lose, MD ? ?DIAGNOSIS: The encounter diagnosis was Malignant neoplasm of upper-outer quadrant of right breast in female, estrogen receptor negative ([place]). ? ?Stage IIIB (cT4d, cN1, cM0) Right Breast UOQ, Invasive Lobular Carcinoma, ER- / PR- / Her2+, Grade 3; status post chemotherapy and bilateral mastectomies with no evidence of residual carcinoma  ? ?HISTORY OF PRESENT ILLNESS::Lynn Mcgee is a 66 y.o. female who is  seen as a courtesy of Dr. Lindi Adie for an opinion concerning radiation therapy as part of management for her diagnosed right breast cancer.  ? ?The patient initially presented with right breast thickening and swelling this past October 2022. Subsequent diagnostic bilateral mammogram and right breast ultrasound on 09/08/21 revealed: a highly suspicious mass involving the majority of the right breast, measuring at least 11 cm; diffuse right breast skin thickening; and 3 abnormal right axillary lymph nodes likely reflective of malignant involvement. No evidence of malignancy in the left breast was appreciated.  ? ?Biopsy of the 12 o'clock right breast collected on 09/10/21 revealed grade 3 invasive lobular carcinoma measuring 1.4 cm in the greatest linear extent of the sample. Prognostic indicators significant for: estrogen receptor negative; progesterone receptor negative; Proliferation marker Ki67 at 30%; Her2 status positive; Grade 3. Right axillary lymph node biopsy performed was positive for metastatic carcinoma ? ?Bilateral breast MRI on 09/21/21 showed the biopsy proven right breast cancer in the upper outer right breast, overlying diffuse skin thickening and trabecular thickening, as well as the diffuse mass involving all 4 quadrants of the right breast  measuring at least 10 cm in the max extent. The mass was noted to include the biopsy proven carcinoma, compatible with additional multicentric disease throughout the right breast. MRI also showed at least 5 enlarged /morphologically abnormal level 1 and level 2 lymph nodes in the right axilla, one of which being the previously biopsied lymph node. No evidence of malignancy was appreciated in the left breast.  ? ?CT of the chest abdomen and pelvis on 09/23/21 showed no evidence of abdominal lymphadenopathy or distant metastatic ?disease in the chest, abdomen, or pelvis. CT also showed identical breast findings to the MRI above ? ?Accordingly, the patient proceeded to undergo chemotherapy consisting of TCHP from 09/24/21 through 01/17/22 under the care of Dr. Lindi Adie. This was followed by herceptin perjeta maintenance beginning on 02/04/22. 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  ? ?Whole body bone scan on 09/28/21 showed no evidence of osseous metastatic disease, and increased radiopharmaceutical uptake in the right breast consistent with known right breast cancer.  ? ?Bilateral breast MRI on 01/17/22 showed a significant interval decrease and near complete resolution of the previously described enhancement throughout the right breast, compatible with a marked treatment response. Interval resolution was also seen of the previously described right axillary adenopathy. ? ?The patient opted to proceed with bilateral mastectomies on 02/22/22 under the care of Dr. Ninfa Linden. Pathology revealed no evidence of residual carcinoma in the right breast, with extensive dystrophic microcalcifications in the intramammary arteries. 6 right axillary lymph nodes were also examined and were all negative for  metastatic carcinoma. Left breast mastectomy also showed no evidence of invasive carcinoma, DCIS, atypical ductal hyperplasia, or atypical lobular hyperplasia.  ? ?PREVIOUS RADIATION THERAPY: No ? ?PAST MEDICAL HISTORY:  ?Past Medical History:  ?Diagnosis Date  ? Cancer [facility])   ? right breast cancer  ? CHF (congestive heart failure) ([place])   ? per patient  ? Heart murmur   ? pt states MD stated she had heart murmur "years ago"  ? Hypertension   ? ? ?PAST SURGICAL HISTORY: ?Past Surgical History:  ?Procedure Laterality Date  ? ABDOMINAL HYSTERECTOMY    ? CARDIAC CATHETERIZATION    ? 2021  ? IR IMAGING GUIDED PORT INSERTION  09/17/2021  ? MODIFIED MASTECTOMY Right 02/22/2022  ? Procedure: RIGHT MODIFIED RADICAL MASTECTOMY;  Surgeon: [name], MD;  Location: [facility];  Service: General;  Laterality: Right;  ? TOTAL MASTECTOMY Left 02/22/2022  ? Procedure: LEFT TOTAL MASTECTOMY;  Surgeon: [name], MD;  Location: [facility];  Service: General;  Laterality: Left;  ? ? ?FAMILY HISTORY: History reviewed. No pertinent family history. ? ?SOCIAL HISTORY:  ?Social History  ? ?Tobacco Use  ? Smoking status: Every Day  ?  Packs/day: 0.50  ?  Years: 2.00  ?  Pack years: 1.00  ?  Types: Cigarettes  ? Smokeless tobacco: Never  ?Vaping Use  ? Vaping Use: Never used  ?Substance Use Topics  ? Alcohol use: Not Currently  ?  Comment: "way back"  ? Drug use: Yes  ?  Types: Marijuana  ?  Comment: "occasionally" every couple of months  ? ? ?ALLERGIES: No Known Allergies ? ?MEDICATIONS:  ?Current Outpatient Medications  ?Medication Sig Dispense Refill  ? acetaminophen (TYLENOL) 500 MG tablet Take 1,000 mg by mouth every 6 (six) hours as needed for mild pain.    ? amLODipine (NORVASC) 5 MG tablet Take 1 tablet (5 mg total) by mouth daily. 90 tablet 3  ? atorvastatin (LIPITOR) 40 MG tablet Take 40 mg by mouth daily.    ? carvedilol (COREG) 6.25 MG tablet Take 1 tablet (6.25 mg total) by mouth 2 (two) times daily. 60 tablet 3  ?  ezetimibe (ZETIA) 10 MG tablet Take 10 mg by mouth daily.    ? gabapentin (NEURONTIN) 100 MG capsule Take 1 capsule (100 mg total) by mouth at bedtime. 30 capsule 0  ? hydroquinone 4 % cream Apply topically 2 (two) times daily.    ? hydrOXYzine (ATARAX) 25 MG tablet Take 25 mg by mouth every 8 (eight) hours as needed for anxiety.    ? losartan (COZAAR) 50 MG tablet Take 50 mg by mouth daily.    ? potassium chloride (KLOR-CON) 20 MEQ packet Take 20 mEq by mouth daily. 30 packet 1  ? spironolactone (ALDACTONE) 25 MG tablet Take 1 tablet (25 mg total) by mouth daily. 30 tablet 6  ? traMADol (ULTRAM) 50 MG tablet Take 1 tablet (50 mg total) by mouth every 6 (six) hours as needed for moderate pain or severe pain. 30 tablet 0  ? ?No current facility-administered medications for this encounter.  ? ? ?REVIEW OF SYSTEMS:  A 10+ POINT REVIEW OF SYSTEMS WAS OBTAINED including neurology, dermatology, psychiatry, cardiac, respiratory, lymph, extremities, GI, GU, musculoskeletal, constitutional, reproductive, HEENT.  She continues to have some soreness in the chest area from her recent bilateral mastectomies.  She continues to have drains in place. ?  ?PHYSICAL EXAM:  height is 5' 5"  (1.651 m) and weight is 113 lb 4 oz (51.4  kg). Her temporal temperature is 96.9 ?F (36.1 ?C) (abnormal). Her blood pressure is 178/94 (abnormal) and her pulse is 73. Her respiration is 18 and oxygen saturation is 100%.   ?General: Alert and oriented, in no acute distress ?HEENT: Head is normocephalic. Extraocular movements are intact.  ?Neck: Neck is supple, no palpable cervical or supraclavicular lymphadenopathy. ?Heart: Regular in rate and rhythm with no murmurs, rubs, or gallops. ?Chest: Clear to auscultation bilaterally, with no rhonchi, wheezes, or rales.  Bilateral mastectomy scars which appear to be healing well without signs of drainage or infection.  Drainage tubes remain in position at this time. ?Abdomen: Soft, nontender, nondistended,  with no rigidity or guarding. ?Extremities: No cyanosis or edema. ?Lymphatics: see Neck Exam ?Skin: No concerning lesions. ?Musculoskeletal: symmetric strength and muscle tone throughout. ?Neurologic: Cranial ner

## 2022-03-17 ENCOUNTER — Ambulatory Visit
Admission: RE | Admit: 2022-03-17 | Discharge: 2022-03-17 | Disposition: A | Discharge status: Home / Self Care | Payer: Medicare HMO | Source: Ambulatory Visit | Attending: Radiation Oncology | Admitting: Radiation Oncology

## 2022-03-17 ENCOUNTER — Encounter: Payer: Self-pay | Admitting: Radiation Oncology

## 2022-03-17 VITALS — BP 178/94 | HR 73 | Temp 96.9°F | Resp 18 | Ht 65.0 in | Wt 113.2 lb

## 2022-03-17 DIAGNOSIS — Z171 Estrogen receptor negative status [ER-]: Secondary | ICD-10-CM | POA: Diagnosis not present

## 2022-03-17 DIAGNOSIS — Z9013 Acquired absence of bilateral breasts and nipples: Secondary | ICD-10-CM | POA: Diagnosis not present

## 2022-03-17 DIAGNOSIS — I110 Hypertensive heart disease with heart failure: Secondary | ICD-10-CM | POA: Insufficient documentation

## 2022-03-17 DIAGNOSIS — Z79899 Other long term (current) drug therapy: Secondary | ICD-10-CM | POA: Insufficient documentation

## 2022-03-17 DIAGNOSIS — F17210 Nicotine dependence, cigarettes, uncomplicated: Secondary | ICD-10-CM | POA: Insufficient documentation

## 2022-03-17 DIAGNOSIS — I509 Heart failure, unspecified: Secondary | ICD-10-CM | POA: Diagnosis not present

## 2022-03-17 DIAGNOSIS — Z9221 Personal history of antineoplastic chemotherapy: Secondary | ICD-10-CM | POA: Insufficient documentation

## 2022-03-17 DIAGNOSIS — C50411 Malignant neoplasm of upper-outer quadrant of right female breast: Secondary | ICD-10-CM | POA: Insufficient documentation

## 2022-03-17 DIAGNOSIS — C773 Secondary and unspecified malignant neoplasm of axilla and upper limb lymph nodes: Secondary | ICD-10-CM | POA: Diagnosis not present

## 2022-03-17 NOTE — Progress Notes (Signed)
See MD note for nursing evaluation. °

## 2022-03-21 ENCOUNTER — Ambulatory Visit (HOSPITAL_COMMUNITY): Admission: RE | Admit: 2022-03-21 | Payer: Medicare HMO | Source: Ambulatory Visit

## 2022-03-29 ENCOUNTER — Other Ambulatory Visit: Payer: Self-pay

## 2022-03-29 ENCOUNTER — Ambulatory Visit
Admission: RE | Admit: 2022-03-29 | Discharge: 2022-03-29 | Disposition: A | Discharge status: Home / Self Care | Payer: Medicare HMO | Source: Ambulatory Visit | Attending: Radiation Oncology | Admitting: Radiation Oncology

## 2022-03-29 DIAGNOSIS — C50411 Malignant neoplasm of upper-outer quadrant of right female breast: Secondary | ICD-10-CM | POA: Insufficient documentation

## 2022-03-29 DIAGNOSIS — Z171 Estrogen receptor negative status [ER-]: Secondary | ICD-10-CM

## 2022-03-29 DIAGNOSIS — G629 Polyneuropathy, unspecified: Secondary | ICD-10-CM | POA: Diagnosis not present

## 2022-03-29 DIAGNOSIS — Z510 Encounter for antineoplastic radiation therapy: Secondary | ICD-10-CM | POA: Insufficient documentation

## 2022-03-29 DIAGNOSIS — Z5112 Encounter for antineoplastic immunotherapy: Secondary | ICD-10-CM | POA: Diagnosis not present

## 2022-03-31 ENCOUNTER — Other Ambulatory Visit: Payer: Self-pay

## 2022-03-31 DIAGNOSIS — C50411 Malignant neoplasm of upper-outer quadrant of right female breast: Secondary | ICD-10-CM

## 2022-04-01 ENCOUNTER — Other Ambulatory Visit: Payer: Self-pay | Admitting: Hematology and Oncology

## 2022-04-01 ENCOUNTER — Other Ambulatory Visit: Payer: Self-pay

## 2022-04-01 ENCOUNTER — Inpatient Hospital Stay: Payer: Medicare HMO | Attending: Hematology and Oncology

## 2022-04-01 ENCOUNTER — Encounter: Payer: Self-pay | Admitting: *Deleted

## 2022-04-01 ENCOUNTER — Inpatient Hospital Stay: Payer: Medicare HMO

## 2022-04-01 VITALS — BP 162/83 | HR 70 | Temp 98.3°F | Resp 18 | Wt 114.2 lb

## 2022-04-01 DIAGNOSIS — Z5112 Encounter for antineoplastic immunotherapy: Secondary | ICD-10-CM | POA: Diagnosis not present

## 2022-04-01 DIAGNOSIS — Z171 Estrogen receptor negative status [ER-]: Secondary | ICD-10-CM

## 2022-04-01 DIAGNOSIS — C50411 Malignant neoplasm of upper-outer quadrant of right female breast: Secondary | ICD-10-CM | POA: Insufficient documentation

## 2022-04-01 DIAGNOSIS — Z95828 Presence of other vascular implants and grafts: Secondary | ICD-10-CM

## 2022-04-01 DIAGNOSIS — G629 Polyneuropathy, unspecified: Secondary | ICD-10-CM | POA: Insufficient documentation

## 2022-04-01 LAB — CBC WITH DIFFERENTIAL (CANCER CENTER ONLY)
Abs Immature Granulocytes: 0.01 10*3/uL (ref 0.00–0.07)
Basophils Absolute: 0 10*3/uL (ref 0.0–0.1)
Basophils Relative: 0 %
Eosinophils Absolute: 0.2 10*3/uL (ref 0.0–0.5)
Eosinophils Relative: 4 %
HCT: 32.4 % — ABNORMAL LOW (ref 36.0–46.0)
Hemoglobin: 10.3 g/dL — ABNORMAL LOW (ref 12.0–15.0)
Immature Granulocytes: 0 %
Lymphocytes Relative: 53 %
Lymphs Abs: 2.9 10*3/uL (ref 0.7–4.0)
MCH: 24.6 pg — ABNORMAL LOW (ref 26.0–34.0)
MCHC: 31.8 g/dL (ref 30.0–36.0)
MCV: 77.3 fL — ABNORMAL LOW (ref 80.0–100.0)
Monocytes Absolute: 0.4 10*3/uL (ref 0.1–1.0)
Monocytes Relative: 7 %
Neutro Abs: 2 10*3/uL (ref 1.7–7.7)
Neutrophils Relative %: 36 %
Platelet Count: 313 10*3/uL (ref 150–400)
RBC: 4.19 MIL/uL (ref 3.87–5.11)
RDW: 14.7 % (ref 11.5–15.5)
Smear Review: NORMAL
WBC Count: 5.6 10*3/uL (ref 4.0–10.5)
nRBC: 0 % (ref 0.0–0.2)

## 2022-04-01 LAB — CMP (CANCER CENTER ONLY)
ALT: 8 U/L (ref 0–44)
AST: 12 U/L — ABNORMAL LOW (ref 15–41)
Albumin: 3.9 g/dL (ref 3.5–5.0)
Alkaline Phosphatase: 103 U/L (ref 38–126)
Anion gap: 5 (ref 5–15)
BUN: 15 mg/dL (ref 8–23)
CO2: 27 mmol/L (ref 22–32)
Calcium: 9.1 mg/dL (ref 8.9–10.3)
Chloride: 109 mmol/L (ref 98–111)
Creatinine: 0.7 mg/dL (ref 0.44–1.00)
GFR, Estimated: >60 mL/min (ref >60)
Glucose, Bld: 82 mg/dL (ref 70–99)
Potassium: 3.7 mmol/L (ref 3.5–5.1)
Sodium: 141 mmol/L (ref 135–145)
Total Bilirubin: 0.4 mg/dL (ref 0.3–1.2)
Total Protein: 7.2 g/dL (ref 6.5–8.1)

## 2022-04-01 MED ORDER — SODIUM CHLORIDE 0.9 % IV SOLN: Freq: Once | INTRAVENOUS | Status: AC

## 2022-04-01 MED ORDER — SODIUM CHLORIDE 0.9% FLUSH
10.0000 mL | INTRAVENOUS | Status: DC | PRN
  Administered 2022-04-01: 10 mL

## 2022-04-01 MED ORDER — DIPHENHYDRAMINE HCL 25 MG PO CAPS
50.0000 mg | ORAL_CAPSULE | Freq: Once | ORAL | Status: AC
  Administered 2022-04-01: 50 mg via ORAL
  Filled 2022-04-01: qty 2

## 2022-04-01 MED ORDER — ACETAMINOPHEN 325 MG PO TABS
650.0000 mg | ORAL_TABLET | Freq: Once | ORAL | Status: AC
  Administered 2022-04-01: 650 mg via ORAL
  Filled 2022-04-01: qty 2

## 2022-04-01 MED ORDER — SODIUM CHLORIDE 0.9 % IV SOLN
420.0000 mg | Freq: Once | INTRAVENOUS | Status: AC
  Administered 2022-04-01: 420 mg via INTRAVENOUS
  Filled 2022-04-01: qty 14

## 2022-04-01 MED ORDER — TRASTUZUMAB-ANNS CHEMO 150 MG IV SOLR
6.0000 mg/kg | Freq: Once | INTRAVENOUS | Status: AC
  Administered 2022-04-01: 315 mg via INTRAVENOUS
  Filled 2022-04-01: qty 15

## 2022-04-01 MED ORDER — HEPARIN SOD (PORK) LOCK FLUSH 100 UNIT/ML IV SOLN
500.0000 [IU] | Freq: Once | INTRAVENOUS | Status: AC | PRN
  Administered 2022-04-01: 500 [IU]

## 2022-04-01 MED ORDER — SODIUM CHLORIDE 0.9% FLUSH
10.0000 mL | Freq: Once | INTRAVENOUS | Status: AC
  Administered 2022-04-01: 10 mL

## 2022-04-01 NOTE — Progress Notes (Signed)
Per Dr Lindi Adie, okay to proceed with treatment today with previous echo from 11/09/21 of 45-50%. Pt has an echo scheduled for 04/05/22 ?

## 2022-04-01 NOTE — Patient Instructions (Signed)
[facility]  Discharge Instructions: ?Thank you for choosing [facility] to provide your oncology and hematology care.  ? ?If you have a lab appointment with the [facility], please go directly to the [facility] and check in at the registration area. ?  ?Wear comfortable clothing and clothing appropriate for easy access to any Portacath or PICC line.  ? ?We strive to give you quality time with your provider. You may need to reschedule your appointment if you arrive late (15 or more minutes).  Arriving late affects you and other patients whose appointments are after yours.  Also, if you miss three or more appointments without notifying the office, you may be dismissed from the clinic at the provider?s discretion.    ?  ?For prescription refill requests, have your pharmacy contact our office and allow 72 hours for refills to be completed.   ? ?Today you received the following chemotherapy and/or immunotherapy agents: Herceptin/perjeta    ?  ?To help prevent nausea and vomiting after your treatment, we encourage you to take your nausea medication as directed. ? ?BELOW ARE SYMPTOMS THAT SHOULD BE REPORTED IMMEDIATELY: ?*FEVER GREATER THAN 100.4 F (38 ?C) OR HIGHER ?*CHILLS OR SWEATING ?*NAUSEA AND VOMITING THAT IS NOT CONTROLLED WITH YOUR NAUSEA MEDICATION ?*UNUSUAL SHORTNESS OF BREATH ?*UNUSUAL BRUISING OR BLEEDING ?*URINARY PROBLEMS (pain or burning when urinating, or frequent urination) ?*BOWEL PROBLEMS (unusual diarrhea, constipation, pain near the anus) ?TENDERNESS IN MOUTH AND THROAT WITH OR WITHOUT PRESENCE OF ULCERS (sore throat, sores in mouth, or a toothache) ?UNUSUAL RASH, SWELLING OR PAIN  ?UNUSUAL VAGINAL DISCHARGE OR ITCHING  ? ?Items with * indicate a potential emergency and should be followed up as soon as possible or go to the Emergency Department if any problems should occur. ? ?Please show the CHEMOTHERAPY ALERT CARD or IMMUNOTHERAPY ALERT CARD at  check-in to the Emergency Department and triage nurse. ? ?Should you have questions after your visit or need to cancel or reschedule your appointment, please contact [facility]  Dept: [phone]  and follow the prompts.  Office hours are 8:00 a.m. to 4:30 p.m. Monday - Friday. Please note that voicemails left after 4:00 p.m. may not be returned until the following business day.  We are closed weekends and major holidays. You have access to a nurse at all times for urgent questions. Please call the main number to the clinic Dept: [phone] and follow the prompts. ? ? ?For any non-urgent questions, you may also contact your provider using MyChart. We now offer e-Visits for anyone 44 and older to request care online for non-urgent symptoms. For details visit [email or web address]. ?  ?Also download the MyChart app! Go to the app store, search "MyChart", open the app, select [facility], and log in with your MyChart username and password. ? ?Due to Covid, a mask is required upon entering the hospital/clinic. If you do not have a mask, one will be given to you upon arrival. For doctor visits, patients may have 1 support Jayde Mcallister aged 86 or older with them. For treatment visits, patients cannot have anyone with them due to current Covid guidelines and our immunocompromised population.  ? ?

## 2022-04-03 DIAGNOSIS — Z5112 Encounter for antineoplastic immunotherapy: Secondary | ICD-10-CM | POA: Diagnosis not present

## 2022-04-05 ENCOUNTER — Ambulatory Visit (HOSPITAL_COMMUNITY)
Admission: RE | Admit: 2022-04-05 | Discharge: 2022-04-05 | Disposition: A | Discharge status: Home / Self Care | Payer: Medicare HMO | Source: Ambulatory Visit | Attending: Internal Medicine | Admitting: Internal Medicine

## 2022-04-05 DIAGNOSIS — C50411 Malignant neoplasm of upper-outer quadrant of right female breast: Secondary | ICD-10-CM | POA: Diagnosis present

## 2022-04-05 DIAGNOSIS — I5022 Chronic systolic (congestive) heart failure: Secondary | ICD-10-CM

## 2022-04-05 DIAGNOSIS — Z171 Estrogen receptor negative status [ER-]: Secondary | ICD-10-CM

## 2022-04-06 ENCOUNTER — Ambulatory Visit
Admission: RE | Admit: 2022-04-06 | Discharge: 2022-04-06 | Disposition: A | Discharge status: Home / Self Care | Payer: Medicare HMO | Source: Ambulatory Visit | Attending: Radiation Oncology | Admitting: Radiation Oncology

## 2022-04-06 ENCOUNTER — Other Ambulatory Visit: Payer: Self-pay

## 2022-04-06 DIAGNOSIS — Z5112 Encounter for antineoplastic immunotherapy: Secondary | ICD-10-CM | POA: Diagnosis not present

## 2022-04-06 DIAGNOSIS — C50411 Malignant neoplasm of upper-outer quadrant of right female breast: Secondary | ICD-10-CM

## 2022-04-06 LAB — RAD ONC ARIA SESSION SUMMARY

## 2022-04-06 LAB — ECHOCARDIOGRAM COMPLETE
AR max vel: 1.82 cm2
AV Peak grad: 15.6 mmHg
Ao pk vel: 1.98 m/s
Area-P 1/2: 4.39 cm2
P 1/2 time: 549 msec
S' Lateral: 4.2 cm

## 2022-04-07 ENCOUNTER — Other Ambulatory Visit: Payer: Self-pay

## 2022-04-07 ENCOUNTER — Ambulatory Visit
Admission: RE | Admit: 2022-04-07 | Discharge: 2022-04-07 | Disposition: A | Discharge status: Home / Self Care | Payer: Medicare HMO | Source: Ambulatory Visit | Attending: Radiation Oncology | Admitting: Radiation Oncology

## 2022-04-07 DIAGNOSIS — Z5112 Encounter for antineoplastic immunotherapy: Secondary | ICD-10-CM | POA: Diagnosis not present

## 2022-04-07 LAB — RAD ONC ARIA SESSION SUMMARY
Course Elapsed Days: 1
Plan Fractions Treated to Date: 1
Plan Fractions Treated to Date: 2
Plan Prescribed Dose Per Fraction: 2 Gy
Plan Prescribed Dose Per Fraction: 2 Gy
Plan Total Fractions Prescribed: 12
Plan Total Fractions Prescribed: 25
Plan Total Prescribed Dose: 24 Gy
Plan Total Prescribed Dose: 50 Gy
Reference Point Dosage Given to Date: 4 Gy
Reference Point Dosage Given to Date: 4 Gy
Reference Point Session Dosage Given: 2 Gy
Reference Point Session Dosage Given: 2 Gy
Session Number: 2

## 2022-04-08 ENCOUNTER — Other Ambulatory Visit: Payer: Self-pay

## 2022-04-08 ENCOUNTER — Ambulatory Visit
Admission: RE | Admit: 2022-04-08 | Discharge: 2022-04-08 | Disposition: A | Discharge status: Home / Self Care | Payer: Medicare HMO | Source: Ambulatory Visit | Attending: Radiation Oncology | Admitting: Radiation Oncology

## 2022-04-08 DIAGNOSIS — Z5112 Encounter for antineoplastic immunotherapy: Secondary | ICD-10-CM | POA: Diagnosis not present

## 2022-04-08 LAB — RAD ONC ARIA SESSION SUMMARY
Course Elapsed Days: 2
Plan Fractions Treated to Date: 2
Plan Fractions Treated to Date: 3
Plan Prescribed Dose Per Fraction: 2 Gy
Plan Prescribed Dose Per Fraction: 2 Gy
Plan Total Fractions Prescribed: 13
Plan Total Fractions Prescribed: 25
Plan Total Prescribed Dose: 26 Gy
Plan Total Prescribed Dose: 50 Gy
Reference Point Dosage Given to Date: 6 Gy
Reference Point Dosage Given to Date: 6 Gy
Reference Point Session Dosage Given: 2 Gy
Reference Point Session Dosage Given: 2 Gy
Session Number: 3

## 2022-04-11 ENCOUNTER — Ambulatory Visit
Admission: RE | Admit: 2022-04-11 | Discharge: 2022-04-11 | Disposition: A | Discharge status: Home / Self Care | Payer: Medicare HMO | Source: Ambulatory Visit | Attending: Radiation Oncology | Admitting: Radiation Oncology

## 2022-04-11 ENCOUNTER — Other Ambulatory Visit: Payer: Self-pay

## 2022-04-11 DIAGNOSIS — Z5112 Encounter for antineoplastic immunotherapy: Secondary | ICD-10-CM | POA: Diagnosis not present

## 2022-04-11 LAB — RAD ONC ARIA SESSION SUMMARY
Course Elapsed Days: 5
Plan Fractions Treated to Date: 2
Plan Fractions Treated to Date: 4
Plan Prescribed Dose Per Fraction: 2 Gy
Plan Prescribed Dose Per Fraction: 2 Gy
Plan Total Fractions Prescribed: 12
Plan Total Fractions Prescribed: 25
Plan Total Prescribed Dose: 24 Gy
Plan Total Prescribed Dose: 50 Gy
Reference Point Dosage Given to Date: 8 Gy
Reference Point Dosage Given to Date: 8 Gy
Reference Point Session Dosage Given: 2 Gy
Reference Point Session Dosage Given: 2 Gy
Session Number: 4

## 2022-04-12 ENCOUNTER — Ambulatory Visit
Admission: RE | Admit: 2022-04-12 | Discharge: 2022-04-12 | Disposition: A | Discharge status: Home / Self Care | Payer: Medicare HMO | Source: Ambulatory Visit | Attending: Radiation Oncology | Admitting: Radiation Oncology

## 2022-04-12 ENCOUNTER — Other Ambulatory Visit: Payer: Self-pay

## 2022-04-12 DIAGNOSIS — C50411 Malignant neoplasm of upper-outer quadrant of right female breast: Secondary | ICD-10-CM

## 2022-04-12 DIAGNOSIS — Z5112 Encounter for antineoplastic immunotherapy: Secondary | ICD-10-CM | POA: Diagnosis not present

## 2022-04-12 LAB — RAD ONC ARIA SESSION SUMMARY
Course Elapsed Days: 6
Plan Fractions Treated to Date: 3
Plan Fractions Treated to Date: 5
Plan Prescribed Dose Per Fraction: 2 Gy
Plan Prescribed Dose Per Fraction: 2 Gy
Plan Total Fractions Prescribed: 13
Plan Total Fractions Prescribed: 25
Plan Total Prescribed Dose: 26 Gy
Plan Total Prescribed Dose: 50 Gy
Reference Point Dosage Given to Date: 10 Gy
Reference Point Dosage Given to Date: 10 Gy
Reference Point Session Dosage Given: 2 Gy
Reference Point Session Dosage Given: 2 Gy
Session Number: 5

## 2022-04-12 MED ORDER — RADIAPLEXRX EX GEL: Freq: Once | CUTANEOUS | Status: AC

## 2022-04-12 MED ORDER — ALRA NON-METALLIC DEODORANT (RAD-ONC)
1.0000 "application " | Freq: Once | TOPICAL | Status: AC
  Administered 2022-04-12: 1 via TOPICAL

## 2022-04-12 NOTE — Progress Notes (Signed)
Pt here for patient teaching.   ? ?Pt given Radiation and You booklet, skin care instructions, [place]ra deodorant, and Radiaplex gel.   ? ?Reviewed areas of pertinence such as fatigue, hair loss in treatment field, sexual and fertility changes, skin changes, breast tenderness, and breast swelling .  ? ?Pt able to give teach back of to pat skin and use unscented/gentle soap,apply Radiaplex bid, avoid applying anything to skin within 4 hours of treatment, avoid wearing an under wire bra, and to use an electric razor if they must shave.  ? ?Pt verbalizes understanding of information given and will contact nursing with any questions or concerns.   ? ? ? ? ? ? ?  ?

## 2022-04-13 ENCOUNTER — Ambulatory Visit
Admission: RE | Admit: 2022-04-13 | Discharge: 2022-04-13 | Disposition: A | Discharge status: Home / Self Care | Payer: Medicare HMO | Source: Ambulatory Visit | Attending: Radiation Oncology | Admitting: Radiation Oncology

## 2022-04-13 ENCOUNTER — Other Ambulatory Visit: Payer: Self-pay

## 2022-04-13 DIAGNOSIS — Z5112 Encounter for antineoplastic immunotherapy: Secondary | ICD-10-CM | POA: Diagnosis not present

## 2022-04-13 LAB — RAD ONC ARIA SESSION SUMMARY
Course Elapsed Days: 7
Plan Fractions Treated to Date: 3
Plan Fractions Treated to Date: 6
Plan Prescribed Dose Per Fraction: 2 Gy
Plan Prescribed Dose Per Fraction: 2 Gy
Plan Total Fractions Prescribed: 12
Plan Total Fractions Prescribed: 25
Plan Total Prescribed Dose: 24 Gy
Plan Total Prescribed Dose: 50 Gy
Reference Point Dosage Given to Date: 12 Gy
Reference Point Dosage Given to Date: 12 Gy
Reference Point Session Dosage Given: 2 Gy
Reference Point Session Dosage Given: 2 Gy
Session Number: 6

## 2022-04-14 ENCOUNTER — Ambulatory Visit
Admission: RE | Admit: 2022-04-14 | Discharge: 2022-04-14 | Disposition: A | Discharge status: Home / Self Care | Payer: Medicare HMO | Source: Ambulatory Visit | Attending: Radiation Oncology | Admitting: Radiation Oncology

## 2022-04-14 ENCOUNTER — Other Ambulatory Visit: Payer: Self-pay

## 2022-04-14 DIAGNOSIS — Z5112 Encounter for antineoplastic immunotherapy: Secondary | ICD-10-CM | POA: Diagnosis not present

## 2022-04-14 LAB — RAD ONC ARIA SESSION SUMMARY
Course Elapsed Days: 8
Plan Fractions Treated to Date: 4
Plan Fractions Treated to Date: 7
Plan Prescribed Dose Per Fraction: 2 Gy
Plan Prescribed Dose Per Fraction: 2 Gy
Plan Total Fractions Prescribed: 13
Plan Total Fractions Prescribed: 25
Plan Total Prescribed Dose: 26 Gy
Plan Total Prescribed Dose: 50 Gy
Reference Point Dosage Given to Date: 14 Gy
Reference Point Dosage Given to Date: 14 Gy
Reference Point Session Dosage Given: 2 Gy
Reference Point Session Dosage Given: 2 Gy
Session Number: 7

## 2022-04-15 ENCOUNTER — Other Ambulatory Visit: Payer: Self-pay

## 2022-04-15 ENCOUNTER — Ambulatory Visit
Admission: RE | Admit: 2022-04-15 | Discharge: 2022-04-15 | Disposition: A | Discharge status: Home / Self Care | Payer: Medicare HMO | Source: Ambulatory Visit | Attending: Radiation Oncology | Admitting: Radiation Oncology

## 2022-04-15 DIAGNOSIS — Z5112 Encounter for antineoplastic immunotherapy: Secondary | ICD-10-CM | POA: Diagnosis not present

## 2022-04-15 LAB — RAD ONC ARIA SESSION SUMMARY
Course Elapsed Days: 9
Plan Fractions Treated to Date: 4
Plan Fractions Treated to Date: 8
Plan Prescribed Dose Per Fraction: 2 Gy
Plan Prescribed Dose Per Fraction: 2 Gy
Plan Total Fractions Prescribed: 12
Plan Total Fractions Prescribed: 25
Plan Total Prescribed Dose: 24 Gy
Plan Total Prescribed Dose: 50 Gy
Reference Point Dosage Given to Date: 16 Gy
Reference Point Dosage Given to Date: 16 Gy
Reference Point Session Dosage Given: 2 Gy
Reference Point Session Dosage Given: 2 Gy
Session Number: 8

## 2022-04-15 NOTE — Progress Notes (Signed)
Patient Care Team: Pcp, No as PCP - General Serena Croissant, MD as Consulting Physician (Hematology and Oncology) Donnelly Angelica, RN as Oncology Nurse Navigator Pershing Proud, RN as Oncology Nurse Navigator  DIAGNOSIS:  Encounter Diagnosis  Name Primary?   Malignant neoplasm of upper-outer quadrant of right breast in female, estrogen receptor negative (HCC)     SUMMARY OF ONCOLOGIC HISTORY: Oncology History  Malignant neoplasm of upper-outer quadrant of right breast in female, estrogen receptor negative (HCC)  09/10/2021 Initial Diagnosis   Right breast thickening and swelling: Mammogram revealed extremely hard right breast with peau d'orange skin changes, ultrasound revealed very large irregular mass measuring 11 cm with diffuse skin thickening, 3 abnormal right axillary lymph nodes, biopsy revealed grade 3 invasive mammary cancer with pleomorphic features, lymph node positive, ER 0%, PR 0%, HER2 3+, Ki-67 30%   09/16/2021 Cancer Staging   Staging form: Breast, AJCC 8th Edition - Clinical stage from 09/16/2021: Stage IIIB (cT4d, cN1, cM0, G3, ER-, PR-, HER2+) - Signed by Serena Croissant, MD on 09/16/2021 Stage prefix: Initial diagnosis Histologic grading system: 3 grade system    09/24/2021 - 01/17/2022 Chemotherapy   Patient is on Treatment Plan : BREAST  Docetaxel + Carboplatin + Trastuzumab + Pertuzumab  (TCHP) q21d       02/04/2022 -  Chemotherapy   Patient is on Treatment Plan : BREAST Trastuzumab + Pertuzumab q21d      02/22/2022 Surgery   Bilateral mastectomies: Pathologic complete response, 6 lymph nodes negative     CHIEF COMPLIANT: Follow-up Herceptin Perjeta maintenance  INTERVAL HISTORY: Lynn Mcgee is a  66 y.o. with above-mentioned history of right breast cancer, completed chemotherapy with TCHP. She presents to the clinic today for treatment. Denies side effects from Hp. Denies diarrhea. States that her toes is numb. on a scale from 1-10 she states that the  pain is a 5. Overall she is tolerating the Herceptin.   ALLERGIES:  has No Known Allergies.  MEDICATIONS:  Current Outpatient Medications  Medication Sig Dispense Refill   acetaminophen (TYLENOL) 500 MG tablet Take 1,000 mg by mouth every 6 (six) hours as needed for mild pain.     amLODipine (NORVASC) 10 MG tablet Take 10 mg by mouth daily.     amLODipine (NORVASC) 5 MG tablet Take 1 tablet (5 mg total) by mouth daily. 90 tablet 3   atorvastatin (LIPITOR) 40 MG tablet Take 40 mg by mouth daily.     carvedilol (COREG) 6.25 MG tablet Take 1 tablet (6.25 mg total) by mouth 2 (two) times daily. 60 tablet 3   ezetimibe (ZETIA) 10 MG tablet Take 10 mg by mouth daily.     gabapentin (NEURONTIN) 100 MG capsule Take 1 capsule (100 mg total) by mouth at bedtime. 30 capsule 0   hydroquinone 4 % cream Apply topically 2 (two) times daily.     hydrOXYzine (ATARAX) 25 MG tablet Take 25 mg by mouth every 8 (eight) hours as needed for anxiety.     losartan (COZAAR) 50 MG tablet Take 50 mg by mouth daily.     potassium chloride (KLOR-CON) 20 MEQ packet Take 20 mEq by mouth daily. 30 packet 1   spironolactone (ALDACTONE) 25 MG tablet Take 1 tablet (25 mg total) by mouth daily. 30 tablet 6   traMADol (ULTRAM) 50 MG tablet Take 1 tablet (50 mg total) by mouth every 6 (six) hours as needed for moderate pain or severe pain. 30 tablet 0   No current  facility-administered medications for this visit.    PHYSICAL EXAMINATION: ECOG PERFORMANCE STATUS: 1 - Symptomatic but completely ambulatory  Vitals:   04/22/22 1116  BP: (!) 182/86  Pulse: 74  Resp: 18  Temp: (!) 97.3 F (36.3 C)  SpO2: 100%   Filed Weights   04/22/22 1116  Weight: 115 lb 12.8 oz (52.5 kg)     LABORATORY DATA:  I have reviewed the data as listed    Latest Ref Rng & Units 04/01/2022    1:28 PM 03/11/2022    1:46 PM 02/15/2022   11:31 AM  CMP  Glucose 70 - 99 mg/dL 82   111   107    BUN 8 - 23 mg/dL $Remove'15   17   12    'beQmIQS$ Creatinine 0.44  - 1.00 mg/dL 0.70   0.73   0.62    Sodium 135 - 145 mmol/L 141   140   137    Potassium 3.5 - 5.1 mmol/L 3.7   3.7   4.0    Chloride 98 - 111 mmol/L 109   110   105    CO2 22 - 32 mmol/L $RemoveB'27   23   22    'hJulEZCl$ Calcium 8.9 - 10.3 mg/dL 9.1   8.9   9.1    Total Protein 6.5 - 8.1 g/dL 7.2   7.0     Total Bilirubin 0.3 - 1.2 mg/dL 0.4   0.4     Alkaline Phos 38 - 126 U/L 103   95     AST 15 - 41 U/L 12   14     ALT 0 - 44 U/L 8   6       Lab Results  Component Value Date   WBC 5.6 04/01/2022   HGB 10.3 (L) 04/01/2022   HCT 32.4 (L) 04/01/2022   MCV 77.3 (L) 04/01/2022   PLT 313 04/01/2022   NEUTROABS 2.0 04/01/2022    ASSESSMENT & PLAN:  Malignant neoplasm of upper-outer quadrant of right breast in female, estrogen receptor negative ([facility]) 09/10/2021 right breast thickening and swelling: Mammogram revealed extremely hard right breast with peau d'orange skin changes, ultrasound revealed very large irregular mass measuring 11 cm with diffuse skin thickening, 3 abnormal right axillary lymph nodes, biopsy revealed grade 3 invasive mammary cancer with pleomorphic features, lymph node positive, ER 0%, PR 0%, HER2 3+, Ki-67 30% Stage IIIb   Treatment plan: 1. Neoadjuvant chemotherapy with TCHP 2. 02/22/2022:Bilateral mastectomies: Pathologic complete response, 6 lymph nodes negative 3. Followed by adjuvant radiation therapy 04/07/2022-05/18/2022 --------------------------------------------------------------------------------------------------------------------------------- Current treatment: Herceptin Perjeta maintenance Toxicities: Echocardiogram 04/05/2022 EF 45 to 50% (previous echo December 2022: EF 45 to 50%) Continue with her current treatment Follow-up with cardiology  Peripheral neuropathy: 5 out of 10: She would like to learn more about the neuropathy clinical trial.  She tells is that the gabapentin is not helping her.  She is willing to stop it if she goes on the clinical trial.  Return  to clinic every 3 weeks for Herceptin Perjeta maintenance every 6 weeks for follow-up with [place]    No orders of the defined types were placed in this encounter.  The patient has a good understanding of the overall plan. she agrees with it. she will call with any problems that may develop before the next visit here. Total time spent: 30 mins including face to face time and time spent for planning, charting and co-ordination of care   Harriette Ohara, MD 04/22/22  I Gardiner Coins am scribing for Dr. Lindi Adie  I have reviewed the above documentation for accuracy and completeness, and I agree with the above.

## 2022-04-18 ENCOUNTER — Ambulatory Visit
Admission: RE | Admit: 2022-04-18 | Discharge: 2022-04-18 | Disposition: A | Discharge status: Home / Self Care | Payer: Medicare HMO | Source: Ambulatory Visit | Attending: Radiation Oncology | Admitting: Radiation Oncology

## 2022-04-18 ENCOUNTER — Other Ambulatory Visit: Payer: Self-pay

## 2022-04-18 DIAGNOSIS — Z5112 Encounter for antineoplastic immunotherapy: Secondary | ICD-10-CM | POA: Diagnosis not present

## 2022-04-18 LAB — RAD ONC ARIA SESSION SUMMARY
Course Elapsed Days: 12
Plan Fractions Treated to Date: 5
Plan Fractions Treated to Date: 9
Plan Prescribed Dose Per Fraction: 2 Gy
Plan Prescribed Dose Per Fraction: 2 Gy
Plan Total Fractions Prescribed: 13
Plan Total Fractions Prescribed: 25
Plan Total Prescribed Dose: 26 Gy
Plan Total Prescribed Dose: 50 Gy
Reference Point Dosage Given to Date: 18 Gy
Reference Point Dosage Given to Date: 18 Gy
Reference Point Session Dosage Given: 2 Gy
Reference Point Session Dosage Given: 2 Gy
Session Number: 9

## 2022-04-19 ENCOUNTER — Other Ambulatory Visit: Payer: Self-pay

## 2022-04-19 ENCOUNTER — Ambulatory Visit
Admission: RE | Admit: 2022-04-19 | Discharge: 2022-04-19 | Disposition: A | Discharge status: Home / Self Care | Payer: Medicare HMO | Source: Ambulatory Visit | Attending: Radiation Oncology | Admitting: Radiation Oncology

## 2022-04-19 DIAGNOSIS — Z5112 Encounter for antineoplastic immunotherapy: Secondary | ICD-10-CM | POA: Diagnosis not present

## 2022-04-19 LAB — RAD ONC ARIA SESSION SUMMARY
Course Elapsed Days: 13
Plan Fractions Treated to Date: 10
Plan Fractions Treated to Date: 5
Plan Prescribed Dose Per Fraction: 2 Gy
Plan Prescribed Dose Per Fraction: 2 Gy
Plan Total Fractions Prescribed: 12
Plan Total Fractions Prescribed: 25
Plan Total Prescribed Dose: 24 Gy
Plan Total Prescribed Dose: 50 Gy
Reference Point Dosage Given to Date: 20 Gy
Reference Point Dosage Given to Date: 20 Gy
Reference Point Session Dosage Given: 2 Gy
Reference Point Session Dosage Given: 2 Gy
Session Number: 10

## 2022-04-20 ENCOUNTER — Other Ambulatory Visit: Payer: Self-pay

## 2022-04-20 ENCOUNTER — Ambulatory Visit
Admission: RE | Admit: 2022-04-20 | Discharge: 2022-04-20 | Disposition: A | Discharge status: Home / Self Care | Payer: Medicare HMO | Source: Ambulatory Visit | Attending: Radiation Oncology | Admitting: Radiation Oncology

## 2022-04-20 DIAGNOSIS — Z5112 Encounter for antineoplastic immunotherapy: Secondary | ICD-10-CM | POA: Diagnosis not present

## 2022-04-20 LAB — RAD ONC ARIA SESSION SUMMARY
Course Elapsed Days: 14
Plan Fractions Treated to Date: 11
Plan Fractions Treated to Date: 6
Plan Prescribed Dose Per Fraction: 2 Gy
Plan Prescribed Dose Per Fraction: 2 Gy
Plan Total Fractions Prescribed: 13
Plan Total Fractions Prescribed: 25
Plan Total Prescribed Dose: 26 Gy
Plan Total Prescribed Dose: 50 Gy
Reference Point Dosage Given to Date: 22 Gy
Reference Point Dosage Given to Date: 22 Gy
Reference Point Session Dosage Given: 2 Gy
Reference Point Session Dosage Given: 2 Gy
Session Number: 11

## 2022-04-21 ENCOUNTER — Ambulatory Visit
Admission: RE | Admit: 2022-04-21 | Discharge: 2022-04-21 | Disposition: A | Discharge status: Home / Self Care | Payer: Medicare HMO | Source: Ambulatory Visit | Attending: Radiation Oncology | Admitting: Radiation Oncology

## 2022-04-21 ENCOUNTER — Other Ambulatory Visit: Payer: Self-pay | Admitting: *Deleted

## 2022-04-21 ENCOUNTER — Other Ambulatory Visit: Payer: Self-pay

## 2022-04-21 DIAGNOSIS — Z5112 Encounter for antineoplastic immunotherapy: Secondary | ICD-10-CM | POA: Diagnosis not present

## 2022-04-21 DIAGNOSIS — C50411 Malignant neoplasm of upper-outer quadrant of right female breast: Secondary | ICD-10-CM

## 2022-04-21 LAB — RAD ONC ARIA SESSION SUMMARY
Course Elapsed Days: 15
Plan Fractions Treated to Date: 12
Plan Fractions Treated to Date: 6
Plan Prescribed Dose Per Fraction: 2 Gy
Plan Prescribed Dose Per Fraction: 2 Gy
Plan Total Fractions Prescribed: 12
Plan Total Fractions Prescribed: 25
Plan Total Prescribed Dose: 24 Gy
Plan Total Prescribed Dose: 50 Gy
Reference Point Dosage Given to Date: 24 Gy
Reference Point Dosage Given to Date: 24 Gy
Reference Point Session Dosage Given: 2 Gy
Reference Point Session Dosage Given: 2 Gy
Session Number: 12

## 2022-04-22 ENCOUNTER — Inpatient Hospital Stay: Payer: Medicare HMO

## 2022-04-22 ENCOUNTER — Other Ambulatory Visit: Payer: Self-pay

## 2022-04-22 ENCOUNTER — Ambulatory Visit
Admission: RE | Admit: 2022-04-22 | Discharge: 2022-04-22 | Disposition: A | Discharge status: Home / Self Care | Payer: Medicare HMO | Source: Ambulatory Visit | Attending: Radiation Oncology | Admitting: Radiation Oncology

## 2022-04-22 ENCOUNTER — Inpatient Hospital Stay: Payer: Medicare HMO | Admitting: Hematology and Oncology

## 2022-04-22 VITALS — BP 145/87 | HR 70 | Temp 97.7°F | Resp 18

## 2022-04-22 DIAGNOSIS — Z95828 Presence of other vascular implants and grafts: Secondary | ICD-10-CM

## 2022-04-22 DIAGNOSIS — Z171 Estrogen receptor negative status [ER-]: Secondary | ICD-10-CM | POA: Diagnosis not present

## 2022-04-22 DIAGNOSIS — C50411 Malignant neoplasm of upper-outer quadrant of right female breast: Secondary | ICD-10-CM

## 2022-04-22 DIAGNOSIS — Z5112 Encounter for antineoplastic immunotherapy: Secondary | ICD-10-CM | POA: Diagnosis not present

## 2022-04-22 LAB — RAD ONC ARIA SESSION SUMMARY
Course Elapsed Days: 16
Plan Fractions Treated to Date: 13
Plan Fractions Treated to Date: 7
Plan Prescribed Dose Per Fraction: 2 Gy
Plan Prescribed Dose Per Fraction: 2 Gy
Plan Total Fractions Prescribed: 13
Plan Total Fractions Prescribed: 25
Plan Total Prescribed Dose: 26 Gy
Plan Total Prescribed Dose: 50 Gy
Reference Point Dosage Given to Date: 26 Gy
Reference Point Dosage Given to Date: 26 Gy
Reference Point Session Dosage Given: 2 Gy
Reference Point Session Dosage Given: 2 Gy
Session Number: 13

## 2022-04-22 LAB — CBC WITH DIFFERENTIAL (CANCER CENTER ONLY)
Abs Immature Granulocytes: 0.01 10*3/uL (ref 0.00–0.07)
Basophils Absolute: 0 10*3/uL (ref 0.0–0.1)
Basophils Relative: 1 %
Eosinophils Absolute: 0.2 10*3/uL (ref 0.0–0.5)
Eosinophils Relative: 4 %
HCT: 34.5 % — ABNORMAL LOW (ref 36.0–46.0)
Hemoglobin: 11.3 g/dL — ABNORMAL LOW (ref 12.0–15.0)
Immature Granulocytes: 0 %
Lymphocytes Relative: 43 %
Lymphs Abs: 1.9 10*3/uL (ref 0.7–4.0)
MCH: 24.6 pg — ABNORMAL LOW (ref 26.0–34.0)
MCHC: 32.8 g/dL (ref 30.0–36.0)
MCV: 75.2 fL — ABNORMAL LOW (ref 80.0–100.0)
Monocytes Absolute: 0.5 10*3/uL (ref 0.1–1.0)
Monocytes Relative: 11 %
Neutro Abs: 1.8 10*3/uL (ref 1.7–7.7)
Neutrophils Relative %: 41 %
Platelet Count: 278 10*3/uL (ref 150–400)
RBC: 4.59 MIL/uL (ref 3.87–5.11)
RDW: 15.3 % (ref 11.5–15.5)
Smear Review: NORMAL
WBC Count: 4.3 10*3/uL (ref 4.0–10.5)
nRBC: 0 % (ref 0.0–0.2)

## 2022-04-22 LAB — CMP (CANCER CENTER ONLY)
ALT: 6 U/L (ref 0–44)
AST: 13 U/L — ABNORMAL LOW (ref 15–41)
Albumin: 4 g/dL (ref 3.5–5.0)
Alkaline Phosphatase: 101 U/L (ref 38–126)
Anion gap: 6 (ref 5–15)
BUN: 15 mg/dL (ref 8–23)
CO2: 25 mmol/L (ref 22–32)
Calcium: 9.3 mg/dL (ref 8.9–10.3)
Chloride: 107 mmol/L (ref 98–111)
Creatinine: 0.7 mg/dL (ref 0.44–1.00)
GFR, Estimated: >60 mL/min (ref >60)
Glucose, Bld: 78 mg/dL (ref 70–99)
Potassium: 3.9 mmol/L (ref 3.5–5.1)
Sodium: 138 mmol/L (ref 135–145)
Total Bilirubin: 0.5 mg/dL (ref 0.3–1.2)
Total Protein: 7.1 g/dL (ref 6.5–8.1)

## 2022-04-22 MED ORDER — TRASTUZUMAB-ANNS CHEMO 150 MG IV SOLR
6.0000 mg/kg | Freq: Once | INTRAVENOUS | Status: AC
  Administered 2022-04-22: 315 mg via INTRAVENOUS
  Filled 2022-04-22: qty 15

## 2022-04-22 MED ORDER — SODIUM CHLORIDE 0.9 % IV SOLN: Freq: Once | INTRAVENOUS | Status: AC

## 2022-04-22 MED ORDER — DIPHENHYDRAMINE HCL 25 MG PO CAPS
50.0000 mg | ORAL_CAPSULE | Freq: Once | ORAL | Status: AC
  Administered 2022-04-22: 50 mg via ORAL
  Filled 2022-04-22: qty 2

## 2022-04-22 MED ORDER — SODIUM CHLORIDE 0.9 % IV SOLN
420.0000 mg | Freq: Once | INTRAVENOUS | Status: AC
  Administered 2022-04-22: 420 mg via INTRAVENOUS
  Filled 2022-04-22: qty 14

## 2022-04-22 MED ORDER — ACETAMINOPHEN 325 MG PO TABS
650.0000 mg | ORAL_TABLET | Freq: Once | ORAL | Status: AC
  Administered 2022-04-22: 650 mg via ORAL
  Filled 2022-04-22: qty 2

## 2022-04-22 MED ORDER — HEPARIN SOD (PORK) LOCK FLUSH 100 UNIT/ML IV SOLN
500.0000 [IU] | Freq: Once | INTRAVENOUS | Status: AC | PRN
  Administered 2022-04-22: 500 [IU]

## 2022-04-22 MED ORDER — SODIUM CHLORIDE 0.9% FLUSH
10.0000 mL | Freq: Once | INTRAVENOUS | Status: AC
  Administered 2022-04-22: 10 mL

## 2022-04-22 MED ORDER — SODIUM CHLORIDE 0.9% FLUSH
10.0000 mL | INTRAVENOUS | Status: DC | PRN
  Administered 2022-04-22: 10 mL

## 2022-04-22 NOTE — Patient Instructions (Signed)
[facility]  Discharge Instructions: Thank you for choosing [facility] to provide your oncology and hematology care.   If you have a lab appointment with the [facility], please go directly to the [facility] and check in at the registration area.   Wear comfortable clothing and clothing appropriate for easy access to any Portacath or PICC line.   We strive to give you quality time with your provider. You may need to reschedule your appointment if you arrive late (15 or more minutes).  Arriving late affects you and other patients whose appointments are after yours.  Also, if you miss three or more appointments without notifying the office, you may be dismissed from the clinic at the provider's discretion.      For prescription refill requests, have your pharmacy contact our office and allow 72 hours for refills to be completed.    Today you received the following chemotherapy and/or immunotherapy agents: Trastuzumab (Kanjinti) Pertuzumab (Perjeta).   To help prevent nausea and vomiting after your treatment, we encourage you to take your nausea medication as directed.  BELOW ARE SYMPTOMS THAT SHOULD BE REPORTED IMMEDIATELY: *FEVER GREATER THAN 100.4 F (38 C) OR HIGHER *CHILLS OR SWEATING *NAUSEA AND VOMITING THAT IS NOT CONTROLLED WITH YOUR NAUSEA MEDICATION *UNUSUAL SHORTNESS OF BREATH *UNUSUAL BRUISING OR BLEEDING *URINARY PROBLEMS (pain or burning when urinating, or frequent urination) *BOWEL PROBLEMS (unusual diarrhea, constipation, pain near the anus) TENDERNESS IN MOUTH AND THROAT WITH OR WITHOUT PRESENCE OF ULCERS (sore throat, sores in mouth, or a toothache) UNUSUAL RASH, SWELLING OR PAIN  UNUSUAL VAGINAL DISCHARGE OR ITCHING   Items with * indicate a potential emergency and should be followed up as soon as possible or go to the Emergency Department if any problems should occur.  Please show the CHEMOTHERAPY ALERT CARD or  IMMUNOTHERAPY ALERT CARD at check-in to the Emergency Department and triage nurse.  Should you have questions after your visit or need to cancel or reschedule your appointment, please contact [facility]  Dept: [phone]  and follow the prompts.  Office hours are 8:00 a.m. to 4:30 p.m. Monday - Friday. Please note that voicemails left after 4:00 p.m. may not be returned until the following business day.  We are closed weekends and major holidays. You have access to a nurse at all times for urgent questions. Please call the main number to the clinic Dept: [phone] and follow the prompts.   For any non-urgent questions, you may also contact your provider using MyChart. We now offer e-Visits for anyone 66 and older to request care online for non-urgent symptoms. For details visit [email or web address].   Also download the MyChart app! Go to the app store, search "MyChart", open the app, select [facility], and log in with your MyChart username and password.  Due to Covid, a mask is required upon entering the hospital/clinic. If you do not have a mask, one will be given to you upon arrival. For doctor visits, patients may have 1 support person aged 66 or older with them. For treatment visits, patients cannot have anyone with them due to current Covid guidelines and our immunocompromised population.

## 2022-04-22 NOTE — Progress Notes (Signed)
Pt. declines to stay for 30 minute post treatment observation. States she has been tolerating treatment without any issues. Vital signs stable, pt. left via ambulation, no respiratory distress noted.

## 2022-04-22 NOTE — Assessment & Plan Note (Addendum)
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 Neoadjuvant chemotherapy withTCHP 2. 02/22/2022:Bilateral mastectomies: Pathologic complete response, 6 lymph nodes negative 3. Followed by adjuvant radiation therapy 04/07/2022-05/18/2022 --------------------------------------------------------------------------------------------------------------------------------- Current treatment: Herceptin Perjeta maintenance Toxicities: Echocardiogram 04/05/2022 EF 45 to 50% (previous echo December 2022: EF 45 to 50%) Continue with her current treatment Follow-up with cardiology  Peripheral neuropathy: 5 out of 10: She would like to learn more about the neuropathy clinical trial.  She tells is that the gabapentin is not helping her.  She is willing to stop it if she goes on the clinical trial.  Return to clinic every 3 weeks for Herceptin Perjeta maintenance every 6 weeks for follow-up with [place]

## 2022-04-26 ENCOUNTER — Ambulatory Visit
Admission: RE | Admit: 2022-04-26 | Discharge: 2022-04-26 | Disposition: A | Discharge status: Home / Self Care | Payer: Medicare HMO | Source: Ambulatory Visit | Attending: Radiation Oncology | Admitting: Radiation Oncology

## 2022-04-26 ENCOUNTER — Other Ambulatory Visit: Payer: Self-pay

## 2022-04-26 ENCOUNTER — Telehealth: Payer: Self-pay | Admitting: Hematology and Oncology

## 2022-04-26 DIAGNOSIS — Z5112 Encounter for antineoplastic immunotherapy: Secondary | ICD-10-CM | POA: Diagnosis not present

## 2022-04-26 LAB — RAD ONC ARIA SESSION SUMMARY
Course Elapsed Days: 20
Plan Fractions Treated to Date: 14
Plan Fractions Treated to Date: 7
Plan Prescribed Dose Per Fraction: 2 Gy
Plan Prescribed Dose Per Fraction: 2 Gy
Plan Total Fractions Prescribed: 12
Plan Total Fractions Prescribed: 25
Plan Total Prescribed Dose: 24 Gy
Plan Total Prescribed Dose: 50 Gy
Reference Point Dosage Given to Date: 28 Gy
Reference Point Dosage Given to Date: 28 Gy
Reference Point Session Dosage Given: 2 Gy
Reference Point Session Dosage Given: 2 Gy
Session Number: 14

## 2022-04-26 NOTE — Telephone Encounter (Signed)
Scheduled appointment per 5/26 los. Left message.

## 2022-04-27 ENCOUNTER — Ambulatory Visit: Payer: Medicare HMO

## 2022-04-27 ENCOUNTER — Other Ambulatory Visit: Payer: Self-pay

## 2022-04-27 ENCOUNTER — Ambulatory Visit
Admission: RE | Admit: 2022-04-27 | Discharge: 2022-04-27 | Disposition: A | Discharge status: Home / Self Care | Payer: Medicare HMO | Source: Ambulatory Visit | Attending: Radiation Oncology | Admitting: Radiation Oncology

## 2022-04-27 DIAGNOSIS — Z5112 Encounter for antineoplastic immunotherapy: Secondary | ICD-10-CM | POA: Diagnosis not present

## 2022-04-27 LAB — RAD ONC ARIA SESSION SUMMARY
Course Elapsed Days: 21
Plan Fractions Treated to Date: 15
Plan Fractions Treated to Date: 8
Plan Prescribed Dose Per Fraction: 2 Gy
Plan Prescribed Dose Per Fraction: 2 Gy
Plan Total Fractions Prescribed: 13
Plan Total Fractions Prescribed: 25
Plan Total Prescribed Dose: 26 Gy
Plan Total Prescribed Dose: 50 Gy
Reference Point Dosage Given to Date: 30 Gy
Reference Point Dosage Given to Date: 30 Gy
Reference Point Session Dosage Given: 2 Gy
Reference Point Session Dosage Given: 2 Gy
Session Number: 15

## 2022-04-28 ENCOUNTER — Other Ambulatory Visit: Payer: Self-pay

## 2022-04-28 ENCOUNTER — Ambulatory Visit
Admission: RE | Admit: 2022-04-28 | Discharge: 2022-04-28 | Disposition: A | Discharge status: Home / Self Care | Payer: Medicare HMO | Source: Ambulatory Visit | Attending: Radiation Oncology | Admitting: Radiation Oncology

## 2022-04-28 DIAGNOSIS — C50411 Malignant neoplasm of upper-outer quadrant of right female breast: Secondary | ICD-10-CM | POA: Insufficient documentation

## 2022-04-28 DIAGNOSIS — Z171 Estrogen receptor negative status [ER-]: Secondary | ICD-10-CM | POA: Insufficient documentation

## 2022-04-28 LAB — RAD ONC ARIA SESSION SUMMARY
Course Elapsed Days: 22
Plan Fractions Treated to Date: 16
Plan Fractions Treated to Date: 8
Plan Prescribed Dose Per Fraction: 2 Gy
Plan Prescribed Dose Per Fraction: 2 Gy
Plan Total Fractions Prescribed: 12
Plan Total Fractions Prescribed: 25
Plan Total Prescribed Dose: 24 Gy
Plan Total Prescribed Dose: 50 Gy
Reference Point Dosage Given to Date: 32 Gy
Reference Point Dosage Given to Date: 32 Gy
Reference Point Session Dosage Given: 2 Gy
Reference Point Session Dosage Given: 2 Gy
Session Number: 16

## 2022-04-29 ENCOUNTER — Other Ambulatory Visit: Payer: Self-pay

## 2022-04-29 ENCOUNTER — Ambulatory Visit
Admission: RE | Admit: 2022-04-29 | Discharge: 2022-04-29 | Disposition: A | Discharge status: Home / Self Care | Payer: Medicare HMO | Source: Ambulatory Visit | Attending: Radiation Oncology | Admitting: Radiation Oncology

## 2022-04-29 DIAGNOSIS — C50411 Malignant neoplasm of upper-outer quadrant of right female breast: Secondary | ICD-10-CM | POA: Diagnosis not present

## 2022-04-29 LAB — RAD ONC ARIA SESSION SUMMARY
Course Elapsed Days: 23
Plan Fractions Treated to Date: 17
Plan Fractions Treated to Date: 9
Plan Prescribed Dose Per Fraction: 2 Gy
Plan Prescribed Dose Per Fraction: 2 Gy
Plan Total Fractions Prescribed: 13
Plan Total Fractions Prescribed: 25
Plan Total Prescribed Dose: 26 Gy
Plan Total Prescribed Dose: 50 Gy
Reference Point Dosage Given to Date: 34 Gy
Reference Point Dosage Given to Date: 34 Gy
Reference Point Session Dosage Given: 2 Gy
Reference Point Session Dosage Given: 2 Gy
Session Number: 17

## 2022-05-02 ENCOUNTER — Other Ambulatory Visit: Payer: Self-pay

## 2022-05-02 ENCOUNTER — Ambulatory Visit
Admission: RE | Admit: 2022-05-02 | Discharge: 2022-05-02 | Disposition: A | Discharge status: Home / Self Care | Payer: Medicare HMO | Source: Ambulatory Visit | Attending: Radiation Oncology | Admitting: Radiation Oncology

## 2022-05-02 DIAGNOSIS — C50411 Malignant neoplasm of upper-outer quadrant of right female breast: Secondary | ICD-10-CM | POA: Diagnosis not present

## 2022-05-02 LAB — RAD ONC ARIA SESSION SUMMARY
Course Elapsed Days: 26
Plan Fractions Treated to Date: 18
Plan Fractions Treated to Date: 9
Plan Prescribed Dose Per Fraction: 2 Gy
Plan Prescribed Dose Per Fraction: 2 Gy
Plan Total Fractions Prescribed: 12
Plan Total Fractions Prescribed: 25
Plan Total Prescribed Dose: 24 Gy
Plan Total Prescribed Dose: 50 Gy
Reference Point Dosage Given to Date: 36 Gy
Reference Point Dosage Given to Date: 36 Gy
Reference Point Session Dosage Given: 2 Gy
Reference Point Session Dosage Given: 2 Gy
Session Number: 18

## 2022-05-03 ENCOUNTER — Ambulatory Visit: Payer: Medicare HMO

## 2022-05-03 ENCOUNTER — Other Ambulatory Visit: Payer: Self-pay

## 2022-05-03 ENCOUNTER — Ambulatory Visit
Admission: RE | Admit: 2022-05-03 | Discharge: 2022-05-03 | Disposition: A | Discharge status: Home / Self Care | Payer: Medicare HMO | Source: Ambulatory Visit | Attending: Radiation Oncology | Admitting: Radiation Oncology

## 2022-05-03 DIAGNOSIS — Z5112 Encounter for antineoplastic immunotherapy: Secondary | ICD-10-CM | POA: Insufficient documentation

## 2022-05-03 DIAGNOSIS — C50411 Malignant neoplasm of upper-outer quadrant of right female breast: Secondary | ICD-10-CM | POA: Insufficient documentation

## 2022-05-03 LAB — RAD ONC ARIA SESSION SUMMARY
Course Elapsed Days: 27
Plan Fractions Treated to Date: 10
Plan Fractions Treated to Date: 19
Plan Prescribed Dose Per Fraction: 2 Gy
Plan Prescribed Dose Per Fraction: 2 Gy
Plan Total Fractions Prescribed: 13
Plan Total Fractions Prescribed: 25
Plan Total Prescribed Dose: 26 Gy
Plan Total Prescribed Dose: 50 Gy
Reference Point Dosage Given to Date: 38 Gy
Reference Point Dosage Given to Date: 38 Gy
Reference Point Session Dosage Given: 2 Gy
Reference Point Session Dosage Given: 2 Gy
Session Number: 19

## 2022-05-04 ENCOUNTER — Other Ambulatory Visit: Payer: Self-pay

## 2022-05-04 ENCOUNTER — Ambulatory Visit
Admission: RE | Admit: 2022-05-04 | Discharge: 2022-05-04 | Disposition: A | Discharge status: Home / Self Care | Payer: Medicare HMO | Source: Ambulatory Visit | Attending: Radiation Oncology | Admitting: Radiation Oncology

## 2022-05-04 DIAGNOSIS — C50411 Malignant neoplasm of upper-outer quadrant of right female breast: Secondary | ICD-10-CM | POA: Diagnosis not present

## 2022-05-04 LAB — RAD ONC ARIA SESSION SUMMARY
Course Elapsed Days: 28
Plan Fractions Treated to Date: 10
Plan Fractions Treated to Date: 20
Plan Prescribed Dose Per Fraction: 2 Gy
Plan Prescribed Dose Per Fraction: 2 Gy
Plan Total Fractions Prescribed: 12
Plan Total Fractions Prescribed: 25
Plan Total Prescribed Dose: 24 Gy
Plan Total Prescribed Dose: 50 Gy
Reference Point Dosage Given to Date: 40 Gy
Reference Point Dosage Given to Date: 40 Gy
Reference Point Session Dosage Given: 2 Gy
Reference Point Session Dosage Given: 2 Gy
Session Number: 20

## 2022-05-05 ENCOUNTER — Ambulatory Visit
Admission: RE | Admit: 2022-05-05 | Discharge: 2022-05-05 | Disposition: A | Discharge status: Home / Self Care | Payer: Medicare HMO | Source: Ambulatory Visit | Attending: Radiation Oncology | Admitting: Radiation Oncology

## 2022-05-05 ENCOUNTER — Other Ambulatory Visit: Payer: Self-pay

## 2022-05-05 DIAGNOSIS — C50411 Malignant neoplasm of upper-outer quadrant of right female breast: Secondary | ICD-10-CM | POA: Diagnosis not present

## 2022-05-05 LAB — RAD ONC ARIA SESSION SUMMARY
Course Elapsed Days: 29
Plan Fractions Treated to Date: 11
Plan Fractions Treated to Date: 21
Plan Prescribed Dose Per Fraction: 2 Gy
Plan Prescribed Dose Per Fraction: 2 Gy
Plan Total Fractions Prescribed: 13
Plan Total Fractions Prescribed: 25
Plan Total Prescribed Dose: 26 Gy
Plan Total Prescribed Dose: 50 Gy
Reference Point Dosage Given to Date: 42 Gy
Reference Point Dosage Given to Date: 42 Gy
Reference Point Session Dosage Given: 2 Gy
Reference Point Session Dosage Given: 2 Gy
Session Number: 21

## 2022-05-06 ENCOUNTER — Ambulatory Visit
Admission: RE | Admit: 2022-05-06 | Discharge: 2022-05-06 | Disposition: A | Discharge status: Home / Self Care | Payer: Medicare HMO | Source: Ambulatory Visit | Attending: Radiation Oncology | Admitting: Radiation Oncology

## 2022-05-06 ENCOUNTER — Other Ambulatory Visit: Payer: Self-pay

## 2022-05-06 DIAGNOSIS — C50411 Malignant neoplasm of upper-outer quadrant of right female breast: Secondary | ICD-10-CM | POA: Diagnosis not present

## 2022-05-06 LAB — RAD ONC ARIA SESSION SUMMARY
Course Elapsed Days: 30
Plan Fractions Treated to Date: 11
Plan Fractions Treated to Date: 22
Plan Prescribed Dose Per Fraction: 2 Gy
Plan Prescribed Dose Per Fraction: 2 Gy
Plan Total Fractions Prescribed: 12
Plan Total Fractions Prescribed: 25
Plan Total Prescribed Dose: 24 Gy
Plan Total Prescribed Dose: 50 Gy
Reference Point Dosage Given to Date: 44 Gy
Reference Point Dosage Given to Date: 44 Gy
Reference Point Session Dosage Given: 2 Gy
Reference Point Session Dosage Given: 2 Gy
Session Number: 22

## 2022-05-09 ENCOUNTER — Other Ambulatory Visit: Payer: Self-pay

## 2022-05-09 ENCOUNTER — Ambulatory Visit
Admission: RE | Admit: 2022-05-09 | Discharge: 2022-05-09 | Disposition: A | Discharge status: Home / Self Care | Payer: Medicare HMO | Source: Ambulatory Visit | Attending: Radiation Oncology | Admitting: Radiation Oncology

## 2022-05-09 DIAGNOSIS — C50411 Malignant neoplasm of upper-outer quadrant of right female breast: Secondary | ICD-10-CM | POA: Diagnosis not present

## 2022-05-09 LAB — RAD ONC ARIA SESSION SUMMARY
Course Elapsed Days: 33
Plan Fractions Treated to Date: 12
Plan Fractions Treated to Date: 23
Plan Prescribed Dose Per Fraction: 2 Gy
Plan Prescribed Dose Per Fraction: 2 Gy
Plan Total Fractions Prescribed: 13
Plan Total Fractions Prescribed: 25
Plan Total Prescribed Dose: 26 Gy
Plan Total Prescribed Dose: 50 Gy
Reference Point Dosage Given to Date: 46 Gy
Reference Point Dosage Given to Date: 46 Gy
Reference Point Session Dosage Given: 2 Gy
Reference Point Session Dosage Given: 2 Gy
Session Number: 23

## 2022-05-10 ENCOUNTER — Ambulatory Visit
Admission: RE | Admit: 2022-05-10 | Discharge: 2022-05-10 | Disposition: A | Discharge status: Home / Self Care | Payer: Medicare HMO | Source: Ambulatory Visit | Attending: Radiation Oncology | Admitting: Radiation Oncology

## 2022-05-10 ENCOUNTER — Ambulatory Visit: Payer: Medicare HMO

## 2022-05-10 ENCOUNTER — Other Ambulatory Visit: Payer: Self-pay

## 2022-05-10 DIAGNOSIS — C50411 Malignant neoplasm of upper-outer quadrant of right female breast: Secondary | ICD-10-CM | POA: Diagnosis not present

## 2022-05-10 LAB — RAD ONC ARIA SESSION SUMMARY
Course Elapsed Days: 34
Plan Fractions Treated to Date: 12
Plan Fractions Treated to Date: 24
Plan Prescribed Dose Per Fraction: 2 Gy
Plan Prescribed Dose Per Fraction: 2 Gy
Plan Total Fractions Prescribed: 12
Plan Total Fractions Prescribed: 25
Plan Total Prescribed Dose: 24 Gy
Plan Total Prescribed Dose: 50 Gy
Reference Point Dosage Given to Date: 48 Gy
Reference Point Dosage Given to Date: 48 Gy
Reference Point Session Dosage Given: 2 Gy
Reference Point Session Dosage Given: 2 Gy
Session Number: 24

## 2022-05-10 MED ORDER — RADIAPLEXRX EX GEL: Freq: Once | CUTANEOUS | Status: AC

## 2022-05-11 ENCOUNTER — Ambulatory Visit
Admission: RE | Admit: 2022-05-11 | Discharge: 2022-05-11 | Disposition: A | Discharge status: Home / Self Care | Payer: Medicare HMO | Source: Ambulatory Visit | Attending: Radiation Oncology | Admitting: Radiation Oncology

## 2022-05-11 ENCOUNTER — Other Ambulatory Visit: Payer: Self-pay

## 2022-05-11 DIAGNOSIS — C50411 Malignant neoplasm of upper-outer quadrant of right female breast: Secondary | ICD-10-CM | POA: Diagnosis not present

## 2022-05-11 LAB — RAD ONC ARIA SESSION SUMMARY
Course Elapsed Days: 35
Plan Fractions Treated to Date: 13
Plan Fractions Treated to Date: 25
Plan Prescribed Dose Per Fraction: 2 Gy
Plan Prescribed Dose Per Fraction: 2 Gy
Plan Total Fractions Prescribed: 13
Plan Total Fractions Prescribed: 25
Plan Total Prescribed Dose: 26 Gy
Plan Total Prescribed Dose: 50 Gy
Reference Point Dosage Given to Date: 50 Gy
Reference Point Dosage Given to Date: 50 Gy
Reference Point Session Dosage Given: 2 Gy
Reference Point Session Dosage Given: 2 Gy
Session Number: 25

## 2022-05-12 ENCOUNTER — Other Ambulatory Visit: Payer: Self-pay

## 2022-05-12 ENCOUNTER — Ambulatory Visit
Admission: RE | Admit: 2022-05-12 | Discharge: 2022-05-12 | Disposition: A | Discharge status: Home / Self Care | Payer: Medicare HMO | Source: Ambulatory Visit | Attending: Radiation Oncology | Admitting: Radiation Oncology

## 2022-05-12 ENCOUNTER — Inpatient Hospital Stay: Payer: Medicare HMO

## 2022-05-12 VITALS — BP 159/70 | HR 75 | Temp 97.8°F | Resp 18 | Ht 65.0 in | Wt 117.2 lb

## 2022-05-12 DIAGNOSIS — Z171 Estrogen receptor negative status [ER-]: Secondary | ICD-10-CM

## 2022-05-12 DIAGNOSIS — C50411 Malignant neoplasm of upper-outer quadrant of right female breast: Secondary | ICD-10-CM | POA: Diagnosis not present

## 2022-05-12 LAB — RAD ONC ARIA SESSION SUMMARY
Course Elapsed Days: 36
Plan Fractions Treated to Date: 1
Plan Prescribed Dose Per Fraction: 2 Gy
Plan Total Fractions Prescribed: 5
Plan Total Prescribed Dose: 10 Gy
Reference Point Dosage Given to Date: 52 Gy
Reference Point Session Dosage Given: 2 Gy
Session Number: 26

## 2022-05-12 MED ORDER — SODIUM CHLORIDE 0.9% FLUSH
10.0000 mL | INTRAVENOUS | Status: DC | PRN
  Administered 2022-05-12: 10 mL

## 2022-05-12 MED ORDER — TRASTUZUMAB-ANNS CHEMO 150 MG IV SOLR
6.0000 mg/kg | Freq: Once | INTRAVENOUS | Status: AC
  Administered 2022-05-12: 315 mg via INTRAVENOUS
  Filled 2022-05-12: qty 15

## 2022-05-12 MED ORDER — DIPHENHYDRAMINE HCL 25 MG PO CAPS
50.0000 mg | ORAL_CAPSULE | Freq: Once | ORAL | Status: AC
  Administered 2022-05-12: 50 mg via ORAL
  Filled 2022-05-12: qty 2

## 2022-05-12 MED ORDER — ACETAMINOPHEN 325 MG PO TABS
650.0000 mg | ORAL_TABLET | Freq: Once | ORAL | Status: AC
  Administered 2022-05-12: 650 mg via ORAL
  Filled 2022-05-12: qty 2

## 2022-05-12 MED ORDER — SODIUM CHLORIDE 0.9 % IV SOLN
420.0000 mg | Freq: Once | INTRAVENOUS | Status: AC
  Administered 2022-05-12: 420 mg via INTRAVENOUS
  Filled 2022-05-12: qty 14

## 2022-05-12 MED ORDER — HEPARIN SOD (PORK) LOCK FLUSH 100 UNIT/ML IV SOLN
500.0000 [IU] | Freq: Once | INTRAVENOUS | Status: AC | PRN
  Administered 2022-05-12: 500 [IU]

## 2022-05-12 MED ORDER — SODIUM CHLORIDE 0.9 % IV SOLN: Freq: Once | INTRAVENOUS | Status: AC

## 2022-05-12 NOTE — Patient Instructions (Signed)
[facility]  Discharge Instructions: Thank you for choosing [facility] to provide your oncology and hematology care.   If you have a lab appointment with the [facility], please go directly to the [facility] and check in at the registration area.   Wear comfortable clothing and clothing appropriate for easy access to any Portacath or PICC line.   We strive to give you quality time with your provider. You may need to reschedule your appointment if you arrive late (15 or more minutes).  Arriving late affects you and other patients whose appointments are after yours.  Also, if you miss three or more appointments without notifying the office, you may be dismissed from the clinic at the provider's discretion.      For prescription refill requests, have your pharmacy contact our office and allow 72 hours for refills to be completed.    Today you received the following chemotherapy and/or immunotherapy agents: Trastuzumab (Kanjinti) and Pertuzumab (Perjeta)   To help prevent nausea and vomiting after your treatment, we encourage you to take your nausea medication as directed.  BELOW ARE SYMPTOMS THAT SHOULD BE REPORTED IMMEDIATELY: *FEVER GREATER THAN 100.4 F (38 C) OR HIGHER *CHILLS OR SWEATING *NAUSEA AND VOMITING THAT IS NOT CONTROLLED WITH YOUR NAUSEA MEDICATION *UNUSUAL SHORTNESS OF BREATH *UNUSUAL BRUISING OR BLEEDING *URINARY PROBLEMS (pain or burning when urinating, or frequent urination) *BOWEL PROBLEMS (unusual diarrhea, constipation, pain near the anus) TENDERNESS IN MOUTH AND THROAT WITH OR WITHOUT PRESENCE OF ULCERS (sore throat, sores in mouth, or a toothache) UNUSUAL RASH, SWELLING OR PAIN  UNUSUAL VAGINAL DISCHARGE OR ITCHING   Items with * indicate a potential emergency and should be followed up as soon as possible or go to the Emergency Department if any problems should occur.  Please show the CHEMOTHERAPY ALERT CARD or  IMMUNOTHERAPY ALERT CARD at check-in to the Emergency Department and triage nurse.  Should you have questions after your visit or need to cancel or reschedule your appointment, please contact [facility]  Dept: [phone]  and follow the prompts.  Office hours are 8:00 a.m. to 4:30 p.m. Monday - Friday. Please note that voicemails left after 4:00 p.m. may not be returned until the following business day.  We are closed weekends and major holidays. You have access to a nurse at all times for urgent questions. Please call the main number to the clinic Dept: [phone] and follow the prompts.   For any non-urgent questions, you may also contact your provider using MyChart. We now offer e-Visits for anyone 71 and older to request care online for non-urgent symptoms. For details visit [email or web address].   Also download the MyChart app! Go to the app store, search "MyChart", open the app, select [facility], and log in with your MyChart username and password.  Masks are optional in the cancer centers. If you would like for your care team to wear a mask while they are taking care of you, please let them know. For doctor visits, patients may have with them one support person who is at least 66 years old. At this time, visitors are not allowed in the infusion area.

## 2022-05-13 ENCOUNTER — Ambulatory Visit
Admission: RE | Admit: 2022-05-13 | Discharge: 2022-05-13 | Disposition: A | Discharge status: Home / Self Care | Payer: Medicare HMO | Source: Ambulatory Visit | Attending: Radiation Oncology | Admitting: Radiation Oncology

## 2022-05-13 ENCOUNTER — Other Ambulatory Visit: Payer: Self-pay

## 2022-05-13 DIAGNOSIS — C50411 Malignant neoplasm of upper-outer quadrant of right female breast: Secondary | ICD-10-CM | POA: Diagnosis not present

## 2022-05-13 LAB — RAD ONC ARIA SESSION SUMMARY
Course Elapsed Days: 37
Plan Fractions Treated to Date: 2
Plan Prescribed Dose Per Fraction: 2 Gy
Plan Total Fractions Prescribed: 5
Plan Total Prescribed Dose: 10 Gy
Reference Point Dosage Given to Date: 54 Gy
Reference Point Session Dosage Given: 2 Gy
Session Number: 27

## 2022-05-16 ENCOUNTER — Other Ambulatory Visit: Payer: Self-pay

## 2022-05-16 ENCOUNTER — Ambulatory Visit
Admission: RE | Admit: 2022-05-16 | Discharge: 2022-05-16 | Disposition: A | Discharge status: Home / Self Care | Payer: Medicare HMO | Source: Ambulatory Visit | Attending: Radiation Oncology | Admitting: Radiation Oncology

## 2022-05-16 DIAGNOSIS — C50411 Malignant neoplasm of upper-outer quadrant of right female breast: Secondary | ICD-10-CM | POA: Diagnosis not present

## 2022-05-16 LAB — RAD ONC ARIA SESSION SUMMARY
Course Elapsed Days: 40
Plan Fractions Treated to Date: 3
Plan Prescribed Dose Per Fraction: 2 Gy
Plan Total Fractions Prescribed: 5
Plan Total Prescribed Dose: 10 Gy
Reference Point Dosage Given to Date: 56 Gy
Reference Point Session Dosage Given: 2 Gy
Session Number: 28

## 2022-05-17 ENCOUNTER — Ambulatory Visit: Payer: Medicare HMO

## 2022-05-17 ENCOUNTER — Ambulatory Visit
Admission: RE | Admit: 2022-05-17 | Discharge: 2022-05-17 | Disposition: A | Discharge status: Home / Self Care | Payer: Medicare HMO | Source: Ambulatory Visit | Attending: Radiation Oncology | Admitting: Radiation Oncology

## 2022-05-17 ENCOUNTER — Other Ambulatory Visit: Payer: Self-pay

## 2022-05-17 DIAGNOSIS — C50411 Malignant neoplasm of upper-outer quadrant of right female breast: Secondary | ICD-10-CM | POA: Diagnosis not present

## 2022-05-17 LAB — RAD ONC ARIA SESSION SUMMARY
Course Elapsed Days: 41
Plan Fractions Treated to Date: 4
Plan Prescribed Dose Per Fraction: 2 Gy
Plan Total Fractions Prescribed: 5
Plan Total Prescribed Dose: 10 Gy
Reference Point Dosage Given to Date: 58 Gy
Reference Point Session Dosage Given: 2 Gy
Session Number: 29

## 2022-05-18 ENCOUNTER — Other Ambulatory Visit: Payer: Self-pay

## 2022-05-18 ENCOUNTER — Encounter: Payer: Self-pay | Admitting: *Deleted

## 2022-05-18 ENCOUNTER — Ambulatory Visit
Admission: RE | Admit: 2022-05-18 | Discharge: 2022-05-18 | Disposition: A | Discharge status: Home / Self Care | Payer: Medicare HMO | Source: Ambulatory Visit | Attending: Radiation Oncology | Admitting: Radiation Oncology

## 2022-05-18 DIAGNOSIS — C50411 Malignant neoplasm of upper-outer quadrant of right female breast: Secondary | ICD-10-CM | POA: Diagnosis not present

## 2022-05-18 LAB — RAD ONC ARIA SESSION SUMMARY
Course Elapsed Days: 42
Plan Fractions Treated to Date: 5
Plan Prescribed Dose Per Fraction: 2 Gy
Plan Total Fractions Prescribed: 5
Plan Total Prescribed Dose: 10 Gy
Reference Point Dosage Given to Date: 60 Gy
Reference Point Session Dosage Given: 2 Gy
Session Number: 30

## 2022-05-30 ENCOUNTER — Ambulatory Visit
Admission: RE | Admit: 2022-05-30 | Discharge: 2022-05-30 | Disposition: A | Discharge status: Home / Self Care | Payer: Medicare HMO | Source: Ambulatory Visit | Attending: Radiation Oncology | Admitting: Radiation Oncology

## 2022-05-30 ENCOUNTER — Other Ambulatory Visit: Payer: Self-pay

## 2022-05-30 ENCOUNTER — Ambulatory Visit
Admission: RE | Admit: 2022-05-30 | Discharge: 2022-05-30 | Disposition: A | Discharge status: Home / Self Care | Payer: Medicare HMO | Source: Ambulatory Visit | Attending: Hematology and Oncology | Admitting: Hematology and Oncology

## 2022-05-30 ENCOUNTER — Encounter: Payer: Self-pay | Admitting: Radiation Oncology

## 2022-05-30 ENCOUNTER — Encounter: Payer: Self-pay | Admitting: Hematology and Oncology

## 2022-05-30 ENCOUNTER — Telehealth: Payer: Self-pay | Admitting: Oncology

## 2022-05-30 ENCOUNTER — Other Ambulatory Visit: Payer: Self-pay | Admitting: Oncology

## 2022-05-30 DIAGNOSIS — G629 Polyneuropathy, unspecified: Secondary | ICD-10-CM | POA: Insufficient documentation

## 2022-05-30 DIAGNOSIS — Z171 Estrogen receptor negative status [ER-]: Secondary | ICD-10-CM | POA: Insufficient documentation

## 2022-05-30 DIAGNOSIS — R079 Chest pain, unspecified: Secondary | ICD-10-CM | POA: Insufficient documentation

## 2022-05-30 DIAGNOSIS — C50411 Malignant neoplasm of upper-outer quadrant of right female breast: Secondary | ICD-10-CM | POA: Insufficient documentation

## 2022-05-30 DIAGNOSIS — Z923 Personal history of irradiation: Secondary | ICD-10-CM | POA: Insufficient documentation

## 2022-05-30 DIAGNOSIS — Z5112 Encounter for antineoplastic immunotherapy: Secondary | ICD-10-CM | POA: Diagnosis not present

## 2022-05-30 DIAGNOSIS — R112 Nausea with vomiting, unspecified: Secondary | ICD-10-CM | POA: Insufficient documentation

## 2022-05-30 DIAGNOSIS — Z79899 Other long term (current) drug therapy: Secondary | ICD-10-CM | POA: Insufficient documentation

## 2022-05-30 HISTORY — DX: Personal history of irradiation: Z92.3

## 2022-05-30 LAB — CMP (CANCER CENTER ONLY)
ALT: 8 U/L (ref 0–44)
AST: 14 U/L — ABNORMAL LOW (ref 15–41)
Albumin: 4.5 g/dL (ref 3.5–5.0)
Alkaline Phosphatase: 102 U/L (ref 38–126)
Anion gap: 11 (ref 5–15)
BUN: 12 mg/dL (ref 8–23)
CO2: 27 mmol/L (ref 22–32)
Calcium: 10 mg/dL (ref 8.9–10.3)
Chloride: 103 mmol/L (ref 98–111)
Creatinine: 0.64 mg/dL (ref 0.44–1.00)
GFR, Estimated: >60 mL/min (ref >60)
Glucose, Bld: 130 mg/dL — ABNORMAL HIGH (ref 70–99)
Potassium: 3.5 mmol/L (ref 3.5–5.1)
Sodium: 141 mmol/L (ref 135–145)
Total Bilirubin: 0.6 mg/dL (ref 0.3–1.2)
Total Protein: 8.2 g/dL — ABNORMAL HIGH (ref 6.5–8.1)

## 2022-05-30 LAB — CBC WITH DIFFERENTIAL (CANCER CENTER ONLY)
Abs Immature Granulocytes: 0.02 10*3/uL (ref 0.00–0.07)
Basophils Absolute: 0 10*3/uL (ref 0.0–0.1)
Basophils Relative: 0 %
Eosinophils Absolute: 0 10*3/uL (ref 0.0–0.5)
Eosinophils Relative: 0 %
HCT: 36.7 % (ref 36.0–46.0)
Hemoglobin: 12.2 g/dL (ref 12.0–15.0)
Immature Granulocytes: 0 %
Lymphocytes Relative: 13 %
Lymphs Abs: 0.8 10*3/uL (ref 0.7–4.0)
MCH: 24.3 pg — ABNORMAL LOW (ref 26.0–34.0)
MCHC: 33.2 g/dL (ref 30.0–36.0)
MCV: 73.1 fL — ABNORMAL LOW (ref 80.0–100.0)
Monocytes Absolute: 0.1 10*3/uL (ref 0.1–1.0)
Monocytes Relative: 2 %
Neutro Abs: 5.2 10*3/uL (ref 1.7–7.7)
Neutrophils Relative %: 85 %
Platelet Count: 344 10*3/uL (ref 150–400)
RBC: 5.02 MIL/uL (ref 3.87–5.11)
RDW: 15.9 % — ABNORMAL HIGH (ref 11.5–15.5)
WBC Count: 6.1 10*3/uL (ref 4.0–10.5)
nRBC: 0 % (ref 0.0–0.2)

## 2022-05-30 LAB — MAGNESIUM: Magnesium: 1.8 mg/dL (ref 1.7–2.4)

## 2022-05-30 MED ORDER — TRAMADOL HCL 50 MG PO TABS: 50.0000 mg | ORAL_TABLET | Freq: Four times a day (QID) | ORAL | 0 refills | Status: DC | PRN

## 2022-05-30 MED ORDER — ONDANSETRON 8 MG PO TBDP: 4.0000 mg | ORAL_TABLET | Freq: Once | ORAL | Status: DC

## 2022-05-30 MED ORDER — ONDANSETRON HCL 4 MG PO TABS: 4.0000 mg | ORAL_TABLET | Freq: Three times a day (TID) | ORAL | 1 refills | Status: DC | PRN

## 2022-05-30 MED ORDER — ONDANSETRON HCL 4 MG PO TABS
ORAL_TABLET | ORAL | Status: AC
  Filled 2022-05-30: qty 1

## 2022-05-30 MED ORDER — ONDANSETRON HCL 4 MG PO TABS
4.0000 mg | ORAL_TABLET | Freq: Once | ORAL | Status: DC
  Filled 2022-05-30: qty 1

## 2022-05-30 MED ORDER — PROMETHAZINE HCL 25 MG/ML IJ SOLN
12.5000 mg | Freq: Once | INTRAMUSCULAR | Status: AC
  Administered 2022-05-30: 12.5 mg via INTRAMUSCULAR
  Filled 2022-05-30: qty 1

## 2022-05-30 NOTE — Progress Notes (Addendum)
Lynn Mcgee is here today for follow up post radiation to the breast.   Breast Side: Right chest wall   They completed their radiation on: 05/18/22  Does the patient complain of any of the following: Post radiation skin issues:  Noted patient to have hyperpigmented skin to right chest wall.  Area is peeling. Noted patient to have a small area with right axilla with odor. Patient Patient applying Radiaplex and neosporin.  Breast Tenderness: yes Breast Swelling: no Lymphadema: No Range of Motion limitations: No Fatigue post radiation: Yes Appetite good/fair/poor: Fair  Additional comments if applicable: Patient reports severe nausea and vomiting.Reports pain to right arm rating 10/10. Patient reports not having any Tramadol at home.   BP (!) 193/94 (BP Location: Right Arm, Patient Position: Sitting)   Pulse 83   Temp 98.6 F (37 C) (Temporal)   Resp 18   Ht '5\' 5"'$  (1.651 m)   Wt 110 lb 4 oz (50 kg)   LMP  (LMP Unknown)   SpO2 96%   BMI 18.35 kg/m

## 2022-05-30 NOTE — Progress Notes (Signed)
Patient in for follow up, new order received for Zofran '4mg'$  po x 1. Medication pulled from pyxis, however patient unable to take due to severe vomiting. Zofran wasted with RN.  Obtained new order for Phenergan 12.'5mg'$  IM x1. Patient tolerated IM medication well with positive results.

## 2022-05-30 NOTE — Telephone Encounter (Signed)
Called [name] (daughter) and let her know that Lynn Mcgee's labs were good from today per Dr. Sondra Come. She verbalized understanding and agreement.

## 2022-05-30 NOTE — Telephone Encounter (Addendum)
Lynn Mcgee called and requested a refill of Tramadol to be sent to the [facility] in [facility].  She takes it once a day and is also taking Tylenol.  She is having pain and burning of the skin on her right chest. She said her skin is red and she thinks it is healing.  She is using Radiaplex and neosporin.   She also reports that she is sick to her stomach and was up all night vomiting.  She is not sure if she has an infection from her skin reaction or a stomach bug.

## 2022-05-30 NOTE — Progress Notes (Signed)
Radiation Oncology         [phone] ________________________________  Name: Lynn Mcgee MRN: [id number]  Date: 05/30/2022  DOB: 1956/01/22  Follow-Up Visit Note  CC: Pcp, No  No ref. provider found    ICD-10-CM   1. Malignant neoplasm of upper-outer quadrant of right breast in female, estrogen receptor negative ([facility])  C50.411 promethazine (PHENERGAN) injection 12.5 mg   Z17.1 DISCONTINUED: ondansetron (ZOFRAN) tablet 4 mg    DISCONTINUED: ondansetron (ZOFRAN-ODT) disintegrating tablet 4 mg      Diagnosis:   The encounter diagnosis was Malignant neoplasm of upper-outer quadrant of right breast in female, estrogen receptor negative ([facility]).   Stage IIIB (cT4d, cN1, cM0) Right Breast UOQ, Invasive Lobular Carcinoma, ER- / PR- / Her2+, Grade 3; status post chemotherapy and bilateral mastectomies with no evidence of residual carcinoma   Interval Since Last Radiation:  12 days   Intent: Curative  Radiation Treatment Dates: 04/06/2022 through 05/18/2022 Site Technique Total Dose (Gy) Dose per Fx (Gy) Completed Fx Beam Energies  Chest Wall, Right: CW_R 3D 50/50 2 25/25 6XFFF  Chest Wall, Right: CW_R_Bst Electron 10/10 2 5/5 6E  Sclav-RT: SCV_R 3D 50/50 2 25/25 6X, 10X   Narrative:  The patient returns today for evaluation of  pain, redness, and burning to her right breast area. She notes using Radiaplex and neosporin.  She also reported that she was sick to her stomach and was up all last night vomiting. On the phone this morning with our office, she noted concerns that she may have an infection from her skin reaction or a stomach bug.   During her final weekly treatment check on 05/17/22, the patient reported tightness, soreness, fatigue, and skin irritation (she was given Radiaplex BID and neosporin). Physical exam performed on that same date showed significant hyperpigmentation changes to the right chest wall area, and a small area of breakdown towards the lateral aspect of her mastectomy  scar which had been noticed on previous occasions, and had not healed from her surgery.  No signs of infection in this area were appreciated.   In the interval since her consultation date, the patient has continued on with herceptin perjeta maintenance under the care of Dr. Lindi Adie.  Per her most recent follow up with Dr. Lindi Adie on 04/22/22, the patient's main chemo toxicity has been peripheral neuropathy. She was given gabapentin without relief, and she has expressed interest about entering in a neuropathy clinical trial. She will continue on gabapentin in the mean time but will stop this if she decides to enter the clinical trial.   She requested to be seen for issues with nausea and vomiting as well as pain along the right chest wall area.  The patient has run out of her tramadol.  She is wondering if her nausea and vomiting are related to her radiation therapy.  Allergies:  has No Known Allergies.  Meds: Current Outpatient Medications  Medication Sig Dispense Refill   acetaminophen (TYLENOL) 500 MG tablet Take 1,000 mg by mouth every 6 (six) hours as needed for mild pain.     amLODipine (NORVASC) 10 MG tablet Take 10 mg by mouth daily.     atorvastatin (LIPITOR) 40 MG tablet Take 40 mg by mouth daily.     carvedilol (COREG) 6.25 MG tablet Take 1 tablet (6.25 mg total) by mouth 2 (two) times daily. 60 tablet 3   gabapentin (NEURONTIN) 100 MG capsule Take 1 capsule (100 mg total) by mouth at bedtime. 30 capsule 0  hydroquinone 4 % cream Apply topically 2 (two) times daily.     hydrOXYzine (ATARAX) 25 MG tablet Take 25 mg by mouth every 8 (eight) hours as needed for anxiety.     losartan (COZAAR) 50 MG tablet Take 50 mg by mouth daily.     ondansetron (ZOFRAN) 4 MG tablet Take 1 tablet (4 mg total) by mouth every 8 (eight) hours as needed for nausea or vomiting. 30 tablet 1   potassium chloride (KLOR-CON) 20 MEQ packet Take 20 mEq by mouth daily. 30 packet 1   spironolactone (ALDACTONE) 25 MG  tablet Take 1 tablet (25 mg total) by mouth daily. 30 tablet 6   amLODipine (NORVASC) 5 MG tablet Take 1 tablet (5 mg total) by mouth daily. (Patient not taking: Reported on 05/30/2022) 90 tablet 3   ezetimibe (ZETIA) 10 MG tablet Take 10 mg by mouth daily. (Patient not taking: Reported on 05/30/2022)     traMADol (ULTRAM) 50 MG tablet Take 1 tablet (50 mg total) by mouth every 6 (six) hours as needed for moderate pain or severe pain. 30 tablet 0   No current facility-administered medications for this encounter.   Facility-Administered Medications Ordered in Other Encounters  Medication Dose Route Frequency Provider Last Rate Last Admin   ondansetron (ZOFRAN) 4 MG tablet             Physical Findings: The patient is in no acute distress. Patient is alert and oriented.  height is _0  (1.651 m) and weight is 110 lb 4 oz (50 kg). Her temporal temperature is 98.6 F (37 C). Her blood pressure is 193/94 (abnormal) and her pulse is 83. Her respiration is 18 and oxygen saturation is 96%. .   Lungs are clear to auscultation bilaterally. Heart has regular rate and rhythm. No palpable cervical, supraclavicular, or axillary adenopathy. Abdomen soft, non-tender, normal bowel sounds. The right chest wall area shows significant dry desquamation and hyperpigmentation changes.  No active moist desquamation at the size.  I do not appreciate any drainage or signs of infection along the lateral chest wall area.  She has skin changes up into the supraclavicular and axillary areas with the right upper back.  No significant moist desquamation appreciated.     Lab Findings: Lab Results  Component Value Date   WBC 6.1 05/30/2022   HGB 12.2 05/30/2022   HCT 36.7 05/30/2022   MCV 73.1 (L) 05/30/2022   PLT 344 05/30/2022    Radiographic Findings: No results found.  Impression:  The encounter diagnosis was Malignant neoplasm of upper-outer quadrant of right breast in female, estrogen receptor negative ([facility]).    Stage IIIB (cT4d, cN1, cM0) Right Breast UOQ, Invasive Lobular Carcinoma, ER- / PR- / Her2+, Grade 3; status post chemotherapy and bilateral mastectomies with no evidence of residual carcinoma   The patient is recovering from the effects of radiation.  Patient has been given Silvadene to place along her skin.  We discussed that her nausea and vomiting is not related to her radiation therapy since we did not treat these stomach or  bowel area.  She was given oral Zofran while in the clinic but immediately vomited this medication up.  She was therefore given an IM shot of Phenergan which helped significantly.  Plan: She has been given a refill on her Zofran medication and will take this at home.  She does not wish to have a rectal suppository for her nausea and vomiting.  I have also given her a limited  refill on her tramadol to help with her discomfort/pain along the right chest wall area.  She is to keep her already scheduled follow-up appointment later this month.  She will call if she develops any chills or fever or other issues or her nausea does not clear up.    ____________________________________  Blair Promise, PhD, MD  This document serves as a record of services personally performed by Gery Pray, MD. It was created on his behalf by Roney Mans, a trained medical scribe. The creation of this record is based on the scribe's personal observations and the provider's statements to them. This document has been checked and approved by the attending provider.

## 2022-05-30 NOTE — Telephone Encounter (Signed)
Called Jonathan back and she is going to come in for labs at 12:00 today and follow up with Dr. Sondra Come at 12:30 .

## 2022-06-02 ENCOUNTER — Inpatient Hospital Stay: Payer: Medicare HMO

## 2022-06-02 ENCOUNTER — Other Ambulatory Visit: Payer: Self-pay

## 2022-06-02 ENCOUNTER — Inpatient Hospital Stay: Payer: Medicare HMO | Attending: Hematology and Oncology

## 2022-06-02 ENCOUNTER — Inpatient Hospital Stay (HOSPITAL_BASED_OUTPATIENT_CLINIC_OR_DEPARTMENT_OTHER): Payer: Medicare HMO | Admitting: Adult Health

## 2022-06-02 VITALS — BP 156/79 | HR 71 | Temp 97.9°F | Resp 18 | Ht 65.0 in | Wt 111.6 lb

## 2022-06-02 DIAGNOSIS — C50411 Malignant neoplasm of upper-outer quadrant of right female breast: Secondary | ICD-10-CM | POA: Diagnosis not present

## 2022-06-02 DIAGNOSIS — Z95828 Presence of other vascular implants and grafts: Secondary | ICD-10-CM

## 2022-06-02 DIAGNOSIS — Z5112 Encounter for antineoplastic immunotherapy: Secondary | ICD-10-CM | POA: Insufficient documentation

## 2022-06-02 DIAGNOSIS — Z171 Estrogen receptor negative status [ER-]: Secondary | ICD-10-CM

## 2022-06-02 DIAGNOSIS — Z923 Personal history of irradiation: Secondary | ICD-10-CM | POA: Diagnosis not present

## 2022-06-02 DIAGNOSIS — R079 Chest pain, unspecified: Secondary | ICD-10-CM | POA: Diagnosis not present

## 2022-06-02 DIAGNOSIS — I509 Heart failure, unspecified: Secondary | ICD-10-CM

## 2022-06-02 DIAGNOSIS — G629 Polyneuropathy, unspecified: Secondary | ICD-10-CM | POA: Diagnosis not present

## 2022-06-02 DIAGNOSIS — R112 Nausea with vomiting, unspecified: Secondary | ICD-10-CM | POA: Diagnosis not present

## 2022-06-02 DIAGNOSIS — Z79899 Other long term (current) drug therapy: Secondary | ICD-10-CM | POA: Diagnosis not present

## 2022-06-02 LAB — CBC WITH DIFFERENTIAL (CANCER CENTER ONLY)
Abs Immature Granulocytes: 0.02 10*3/uL (ref 0.00–0.07)
Basophils Absolute: 0 10*3/uL (ref 0.0–0.1)
Basophils Relative: 0 %
Eosinophils Absolute: 0.2 10*3/uL (ref 0.0–0.5)
Eosinophils Relative: 4 %
HCT: 35.3 % — ABNORMAL LOW (ref 36.0–46.0)
Hemoglobin: 11.7 g/dL — ABNORMAL LOW (ref 12.0–15.0)
Immature Granulocytes: 0 %
Lymphocytes Relative: 38 %
Lymphs Abs: 1.7 10*3/uL (ref 0.7–4.0)
MCH: 24.2 pg — ABNORMAL LOW (ref 26.0–34.0)
MCHC: 33.1 g/dL (ref 30.0–36.0)
MCV: 73.1 fL — ABNORMAL LOW (ref 80.0–100.0)
Monocytes Absolute: 0.6 10*3/uL (ref 0.1–1.0)
Monocytes Relative: 12 %
Neutro Abs: 2.1 10*3/uL (ref 1.7–7.7)
Neutrophils Relative %: 46 %
Platelet Count: 333 10*3/uL (ref 150–400)
RBC: 4.83 MIL/uL (ref 3.87–5.11)
RDW: 15.6 % — ABNORMAL HIGH (ref 11.5–15.5)
WBC Count: 4.5 10*3/uL (ref 4.0–10.5)
nRBC: 0 % (ref 0.0–0.2)

## 2022-06-02 LAB — CMP (CANCER CENTER ONLY)
ALT: 7 U/L (ref 0–44)
AST: 14 U/L — ABNORMAL LOW (ref 15–41)
Albumin: 4.1 g/dL (ref 3.5–5.0)
Alkaline Phosphatase: 87 U/L (ref 38–126)
Anion gap: 7 (ref 5–15)
BUN: 24 mg/dL — ABNORMAL HIGH (ref 8–23)
CO2: 28 mmol/L (ref 22–32)
Calcium: 8.9 mg/dL (ref 8.9–10.3)
Chloride: 98 mmol/L (ref 98–111)
Creatinine: 0.86 mg/dL (ref 0.44–1.00)
GFR, Estimated: >60 mL/min (ref >60)
Glucose, Bld: 92 mg/dL (ref 70–99)
Potassium: 3 mmol/L — ABNORMAL LOW (ref 3.5–5.1)
Sodium: 133 mmol/L — ABNORMAL LOW (ref 135–145)
Total Bilirubin: 0.6 mg/dL (ref 0.3–1.2)
Total Protein: 7.2 g/dL (ref 6.5–8.1)

## 2022-06-02 MED ORDER — SODIUM CHLORIDE 0.9 % IV SOLN
420.0000 mg | Freq: Once | INTRAVENOUS | Status: AC
  Administered 2022-06-02: 420 mg via INTRAVENOUS
  Filled 2022-06-02: qty 14

## 2022-06-02 MED ORDER — HEPARIN SOD (PORK) LOCK FLUSH 100 UNIT/ML IV SOLN
500.0000 [IU] | Freq: Once | INTRAVENOUS | Status: AC | PRN
  Administered 2022-06-02: 500 [IU]

## 2022-06-02 MED ORDER — SODIUM CHLORIDE 0.9% FLUSH
10.0000 mL | Freq: Once | INTRAVENOUS | Status: AC
  Administered 2022-06-02: 10 mL

## 2022-06-02 MED ORDER — TRASTUZUMAB-ANNS CHEMO 150 MG IV SOLR
6.0000 mg/kg | Freq: Once | INTRAVENOUS | Status: AC
  Administered 2022-06-02: 315 mg via INTRAVENOUS
  Filled 2022-06-02: qty 15

## 2022-06-02 MED ORDER — DIPHENHYDRAMINE HCL 25 MG PO CAPS
50.0000 mg | ORAL_CAPSULE | Freq: Once | ORAL | Status: AC
  Administered 2022-06-02: 50 mg via ORAL
  Filled 2022-06-02: qty 2

## 2022-06-02 MED ORDER — SODIUM CHLORIDE 0.9% FLUSH
10.0000 mL | INTRAVENOUS | Status: DC | PRN
  Administered 2022-06-02: 10 mL

## 2022-06-02 MED ORDER — ACETAMINOPHEN 325 MG PO TABS
650.0000 mg | ORAL_TABLET | Freq: Once | ORAL | Status: AC
  Administered 2022-06-02: 650 mg via ORAL
  Filled 2022-06-02: qty 2

## 2022-06-02 MED ORDER — SODIUM CHLORIDE 0.9 % IV SOLN: Freq: Once | INTRAVENOUS | Status: AC

## 2022-06-02 NOTE — Progress Notes (Signed)
[facility] Follow up:    Pcp, No No address on file   DIAGNOSIS:  Cancer Staging  Malignant neoplasm of upper-outer quadrant of right breast in female, estrogen receptor negative ([name]) Staging form: Breast, AJCC 8th Edition - Clinical stage from 09/16/2021: Stage IIIB (cT4d, cN1, cM0, G3, ER-, PR-, HER2+) - Signed by Nicholas Lose, MD on 09/16/2021 Stage prefix: Initial diagnosis Histologic grading system: 3 grade system   SUMMARY OF ONCOLOGIC HISTORY: Oncology History  Malignant neoplasm of upper-outer quadrant of right breast in female, estrogen receptor negative ([name])  09/10/2021 Initial Diagnosis   Right breast thickening and swelling: Mammogram revealed extremely hard right breast with peau d'orange skin changes, ultrasound revealed very large irregular mass measuring 11 cm with diffuse skin thickening, 3 abnormal right axillary lymph nodes, biopsy revealed grade 3 invasive mammary cancer with pleomorphic features, lymph node positive, ER 0%, PR 0%, HER2 3+, Ki-67 30%   09/16/2021 Cancer Staging   Staging form: Breast, AJCC 8th Edition - Clinical stage from 09/16/2021: Stage IIIB (cT4d, cN1, cM0, G3, ER-, PR-, HER2+) - Signed by Nicholas Lose, MD on 09/16/2021 Stage prefix: Initial diagnosis Histologic grading system: 3 grade system   09/24/2021 - 01/17/2022 Chemotherapy   Patient is on Treatment Plan : BREAST  Docetaxel + Carboplatin + Trastuzumab + Pertuzumab  (TCHP) q21d      02/04/2022 -  Chemotherapy   Patient is on Treatment Plan : BREAST Trastuzumab + Pertuzumab q21d     02/22/2022 Surgery   Bilateral mastectomies: Pathologic complete response, 6 lymph nodes negative   04/06/2022 - 05/18/2022 Radiation Therapy    04/06/2022 through 05/18/2022 Site Technique Total Dose (Gy) Dose per Fx (Gy) Completed Fx Beam Energies  Chest Wall, Right: CW_R 3D 50/50 2 25/25 6XFFF  Chest Wall, Right: CW_R_Bst Electron 10/10 2 5/5 6E  Sclav-RT: SCV_R 3D 50/50 2 25/25  6X, 10X      CURRENT THERAPY: herceptin/perjeta  INTERVAL HISTORY: Lynn Mcgee 66 y.o. female returns for f/u prior to receiving herceptin/perjeta.  Her most recent echo was completed on 04/05/2022 and showed a low EF of 45-50% (unchanged from prior).  Odessia tells me that she is feeling well today.  She denies any new concerns today such as chest pain, shortness of breath, weight gain/loss.     Patient Active Problem List   Diagnosis Date Noted   S/P bilateral mastectomy 02/22/2022   Vitamin D deficiency 11/26/2021   Mixed hyperlipidemia 11/26/2021   Anxiety 11/26/2021   Port-A-Cath in place 09/30/2021   Malignant neoplasm of upper-outer quadrant of right breast in female, estrogen receptor negative ([name]) 09/16/2021   Hypertension 09/14/2021   Tobacco use 03/08/2021   Renal artery stenosis ([place]) 03/08/2021   Coronary artery disease involving native coronary artery of native heart without angina pectoris 03/08/2021   Acute congestive heart failure ([name]) 08/30/2020   Porokeratosis 10/17/2014   Verruca 10/17/2014   Pain in lower limb 10/17/2014    has No Known Allergies.  MEDICAL HISTORY: Past Medical History:  Diagnosis Date   Cancer [facility])    right breast cancer   CHF (congestive heart failure) (HCC)    per patient   Heart murmur    pt states MD stated she had heart murmur "years ago"   History of radiation therapy    Right chest wall 04/06/22-05/18/22- Dr. Gery Pray   Hypertension     SURGICAL HISTORY: Past Surgical History:  Procedure Laterality Date   ABDOMINAL HYSTERECTOMY  CARDIAC CATHETERIZATION     2021   IR IMAGING GUIDED PORT INSERTION  09/17/2021   MODIFIED MASTECTOMY Right 02/22/2022   Procedure: RIGHT MODIFIED RADICAL MASTECTOMY;  Surgeon: [name], MD;  Location: [facility];  Service: General;  Laterality: Right;   TOTAL MASTECTOMY Left 02/22/2022   Procedure: LEFT TOTAL MASTECTOMY;  Surgeon: [name], MD;  Location: [facility];  Service:  General;  Laterality: Left;    SOCIAL HISTORY: Social History   Socioeconomic History   Marital status: Single    Spouse name: Not on file   Number of children: Not on file   Years of education: Not on file   Highest education level: Not on file  Occupational History   Not on file  Tobacco Use   Smoking status: Every Day    Packs/day: 0.50    Years: 2.00    Total pack years: 1.00    Types: Cigarettes   Smokeless tobacco: Never  Vaping Use   Vaping Use: Never used  Substance and Sexual Activity   Alcohol use: Not Currently    Comment: "way back"   Drug use: Yes    Types: Marijuana    Comment: "occasionally" every couple of months   Sexual activity: Not on file  Other Topics Concern   Not on file  Social History Narrative   Not on file   Social Determinants of Health   Financial Resource Strain: Not on file  Food Insecurity: Not on file  Transportation Needs: Not on file  Physical Activity: Not on file  Stress: Not on file  Social Connections: Not on file  Intimate Partner Violence: Not on file    FAMILY HISTORY: No family history on file.  Review of Systems  Constitutional:  Negative for appetite change, chills, fatigue, fever and unexpected weight change.  HENT:   Negative for hearing loss, lump/mass and trouble swallowing.   Eyes:  Negative for eye problems and icterus.  Respiratory:  Negative for chest tightness, cough and shortness of breath.   Cardiovascular:  Negative for chest pain, leg swelling and palpitations.  Gastrointestinal:  Negative for abdominal distention, abdominal pain, constipation, diarrhea, nausea and vomiting.  Endocrine: Negative for hot flashes.  Genitourinary:  Negative for difficulty urinating.   Musculoskeletal:  Negative for arthralgias.  Skin:  Negative for itching and rash.  Neurological:  Negative for dizziness, extremity weakness, headaches and numbness.  Hematological:  Negative for adenopathy. Does not bruise/bleed  easily.  Psychiatric/Behavioral:  Negative for depression. The patient is not nervous/anxious.       PHYSICAL EXAMINATION  ECOG PERFORMANCE STATUS: 1 - Symptomatic but completely ambulatory  Vitals:   06/02/22 1151  BP: (!) 156/79  Pulse: 71  Resp: 18  Temp: 97.9 F (36.6 C)  SpO2: 100%    Physical Exam Constitutional:      General: She is not in acute distress.    Appearance: Normal appearance. She is not toxic-appearing.  HENT:     Head: Normocephalic and atraumatic.  Eyes:     General: No scleral icterus. Cardiovascular:     Rate and Rhythm: Normal rate and regular rhythm.     Pulses: Normal pulses.     Heart sounds: Normal heart sounds.  Pulmonary:     Effort: Pulmonary effort is normal.     Breath sounds: Normal breath sounds.  Abdominal:     General: Abdomen is flat. Bowel sounds are normal. There is no distension.     Palpations: Abdomen is soft.  Tenderness: There is no abdominal tenderness.  Musculoskeletal:        General: No swelling.     Cervical back: Neck supple.  Lymphadenopathy:     Cervical: No cervical adenopathy.  Skin:    General: Skin is warm and dry.     Findings: No rash.  Neurological:     General: No focal deficit present.     Mental Status: She is alert.  Psychiatric:        Mood and Affect: Mood normal.        Behavior: Behavior normal.     LABORATORY DATA:  CBC    Component Value Date/Time   WBC 4.5 06/02/2022 1126   WBC 6.0 02/15/2022 1131   RBC 4.83 06/02/2022 1126   HGB 11.7 (L) 06/02/2022 1126   HCT 35.3 (L) 06/02/2022 1126   PLT 333 06/02/2022 1126   MCV 73.1 (L) 06/02/2022 1126   MCH 24.2 (L) 06/02/2022 1126   MCHC 33.1 06/02/2022 1126   RDW 15.6 (H) 06/02/2022 1126   LYMPHSABS 1.7 06/02/2022 1126   MONOABS 0.6 06/02/2022 1126   EOSABS 0.2 06/02/2022 1126   BASOSABS 0.0 06/02/2022 1126    CMP     Component Value Date/Time   NA 133 (L) 06/02/2022 1126   K 3.0 (L) 06/02/2022 1126   CL 98 06/02/2022  1126   CO2 28 06/02/2022 1126   GLUCOSE 92 06/02/2022 1126   BUN 24 (H) 06/02/2022 1126   CREATININE 0.86 06/02/2022 1126   CALCIUM 8.9 06/02/2022 1126   PROT 7.2 06/02/2022 1126   ALBUMIN 4.1 06/02/2022 1126   AST 14 (L) 06/02/2022 1126   ALT 7 06/02/2022 1126   ALKPHOS 87 06/02/2022 1126   BILITOT 0.6 06/02/2022 1126   GFRNONAA >60 06/02/2022 1126      ASSESSMENT and THERAPY PLAN:   Malignant neoplasm of upper-outer quadrant of right breast in female, estrogen receptor negative ([place]) Lynn Mcgee is a 66 year old woman with h/o right breast stage IIIB ER/PR negative, HER-2 positive IDC diagnosed in 08/2021 treated with neoadjuvant chemotherapy, bilateral mastectomies, maintenance Herceptin/Perjeta, and adjuvant radiation therapy.  Klare continues on Herceptin/Perjeta with good tolerance.  She just completed radiation therapy on 05/18/2022 and has some skin peeling.  She otherwise tolerated the radiation well.    Her most recent echo remains stable with slightly decreased EF of 45-50%.  She is seeing our cardio-oncology team for close f/u who have cleared her to continue with Herceptin therapy and are following her future echocardiograms.    Alease will continue on Herceptin/Perjeta every 3 weeks as noted above.  We will see her with every other treatment.  I also requested her SCP visit and we discussed what this visit entails as well.     All questions were answered. The patient knows to call the clinic with any problems, questions or concerns. We can certainly see the patient much sooner if necessary.  Total encounter time:30 minutes*in face-to-face visit time, chart review, lab review, care coordination, order entry, and documentation of the encounter time.    Wilber Bihari, NP 06/06/22 10:55 AM Medical Oncology and Hematology [facility] [place], [place] [address] Tel. [phone]    Fax. [phone]  *Total Encounter Time as defined by the  Centers for Medicare and Medicaid Services includes, in addition to the face-to-face time of a patient visit (documented in the note above) non-face-to-face time: obtaining and reviewing outside history, ordering and reviewing medications, tests or  procedures, care coordination (communications with other health care professionals or caregivers) and documentation in the medical record.

## 2022-06-02 NOTE — Patient Instructions (Signed)
[facility] ONCOLOGY  Discharge Instructions: Thank you for choosing [facility] to provide your oncology and hematology care.   If you have a lab appointment with the [facility], please go directly to the [facility] and check in at the registration area.   Wear comfortable clothing and clothing appropriate for easy access to any Portacath or PICC line.   We strive to give you quality time with your provider. You may need to reschedule your appointment if you arrive late (15 or more minutes).  Arriving late affects you and other patients whose appointments are after yours.  Also, if you miss three or more appointments without notifying the office, you may be dismissed from the clinic at the provider's discretion.      For prescription refill requests, have your pharmacy contact our office and allow 72 hours for refills to be completed.    Today you received the following chemotherapy and/or immunotherapy agents : Trastuzumab, Pertuzumab      To help prevent nausea and vomiting after your treatment, we encourage you to take your nausea medication as directed.  BELOW ARE SYMPTOMS THAT SHOULD BE REPORTED IMMEDIATELY: *FEVER GREATER THAN 100.4 F (38 C) OR HIGHER *CHILLS OR SWEATING *NAUSEA AND VOMITING THAT IS NOT CONTROLLED WITH YOUR NAUSEA MEDICATION *UNUSUAL SHORTNESS OF BREATH *UNUSUAL BRUISING OR BLEEDING *URINARY PROBLEMS (pain or burning when urinating, or frequent urination) *BOWEL PROBLEMS (unusual diarrhea, constipation, pain near the anus) TENDERNESS IN MOUTH AND THROAT WITH OR WITHOUT PRESENCE OF ULCERS (sore throat, sores in mouth, or a toothache) UNUSUAL RASH, SWELLING OR PAIN  UNUSUAL VAGINAL DISCHARGE OR ITCHING   Items with * indicate a potential emergency and should be followed up as soon as possible or go to the Emergency Department if any problems should occur.  Please show the CHEMOTHERAPY ALERT CARD or IMMUNOTHERAPY ALERT CARD  at check-in to the Emergency Department and triage nurse.  Should you have questions after your visit or need to cancel or reschedule your appointment, please contact [facility]  Dept: [phone]  and follow the prompts.  Office hours are 8:00 a.m. to 4:30 p.m. Monday - Friday. Please note that voicemails left after 4:00 p.m. may not be returned until the following business day.  We are closed weekends and major holidays. You have access to a nurse at all times for urgent questions. Please call the main number to the clinic Dept: [phone] and follow the prompts.   For any non-urgent questions, you may also contact your provider using MyChart. We now offer e-Visits for anyone 66 and older to request care online for non-urgent symptoms. For details visit [email or web address].   Also download the MyChart app! Go to the app store, search "MyChart", open the app, select [facility], and log in with your MyChart username and password.  Masks are optional in the cancer centers. If you would like for your care team to wear a mask while they are taking care of you, please let them know. For doctor visits, patients may have with them one support person who is at least 66 years old. At this time, visitors are not allowed in the infusion area.

## 2022-06-06 ENCOUNTER — Encounter: Payer: Self-pay | Admitting: Adult Health

## 2022-06-06 ENCOUNTER — Encounter: Payer: Self-pay | Admitting: Hematology and Oncology

## 2022-06-06 ENCOUNTER — Telehealth: Payer: Self-pay | Admitting: Adult Health

## 2022-06-06 NOTE — Assessment & Plan Note (Signed)
Lynn Mcgee is a 66 year old woman with h/o right breast stage IIIB ER/PR negative, HER-2 positive IDC diagnosed in 08/2021 treated with neoadjuvant chemotherapy, bilateral mastectomies, maintenance Herceptin/Perjeta, and adjuvant radiation therapy.  Lynn Mcgee continues on Herceptin/Perjeta with good tolerance.  She just completed radiation therapy on 05/18/2022 and has some skin peeling.  She otherwise tolerated the radiation well.    Her most recent echo remains stable with slightly decreased EF of 45-50%.  She is seeing our cardio-oncology team for close f/u who have cleared her to continue with Herceptin therapy and are following her future echocardiograms.    Lynn Mcgee will continue on Herceptin/Perjeta every 3 weeks as noted above.  We will see her with every other treatment.  I also requested her SCP visit and we discussed what this visit entails as well.

## 2022-06-06 NOTE — Telephone Encounter (Signed)
Scheduled appointment per 7/6 los. Left voicemail.

## 2022-06-20 ENCOUNTER — Other Ambulatory Visit: Payer: Self-pay

## 2022-06-22 NOTE — Progress Notes (Signed)
Radiation Oncology         [phone]) [phone] ________________________________  Name: Lynn Mcgee MRN: [id number]  Date: 06/23/2022  DOB: Nov 28, 1956  Follow-Up Visit Note  CC: Pcp, No  Nicholas Lose, MD  No diagnosis found.  Diagnosis: The encounter diagnosis was Malignant neoplasm of upper-outer quadrant of right breast in female, estrogen receptor negative ([place]).   Stage IIIB (cT4d, cN1, cM0) Right Breast UOQ, Invasive Lobular Carcinoma, ER- / PR- / Her2+, Grade 3; status post chemotherapy and bilateral mastectomies with no evidence of residual carcinoma   Interval Since Last Radiation: 1 month and 6 days   Intent: Curative  Radiation Treatment Dates: 04/06/2022 through 05/18/2022 Site Technique Total Dose (Gy) Dose per Fx (Gy) Completed Fx Beam Energies  Chest Wall, Right: CW_R 3D 50/50 2 25/25 6XFFF  Chest Wall, Right: CW_R_Bst Electron 10/10 2 5/5 6E  Sclav-RT: SCV_R 3D 50/50 2 25/25 6X, 10X   Narrative:  The patient returns today for routine follow-up, she was last seen here for follow-up on 05/30/22. To review from her last visit: the patient was seen early for evaluation of  pain, redness, and burning to her right breast area. She was also sick to her stomach the night before with emesis.  For her skin reaction, the patient was given Silvadene to place along her skin, and a limited supply of tramadol to help with her discomfort/pain along the right chest wall area. I also discussed with her that her nausea and vomiting were not related to her radiation therapy since we did not treat the stomach or bowel area. We also tried her on zofran while she was here last but she immediately vomited this up.  She was therefore given an IM shot of Phenergan which helped significantly.   Since her last visit, the patient followed up with medical oncology on 06/02/22. During which time, the patient was noted to be tolerating herceptin/perjeta maintenance well and denied any side effects.   ***                    Allergies:  has No Known Allergies.  Meds: Current Outpatient Medications  Medication Sig Dispense Refill   acetaminophen (TYLENOL) 500 MG tablet Take 1,000 mg by mouth every 6 (six) hours as needed for mild pain.     amLODipine (NORVASC) 10 MG tablet Take 10 mg by mouth daily.     amLODipine (NORVASC) 5 MG tablet Take 1 tablet (5 mg total) by mouth daily. (Patient not taking: Reported on 05/30/2022) 90 tablet 3   atorvastatin (LIPITOR) 40 MG tablet Take 40 mg by mouth daily.     carvedilol (COREG) 6.25 MG tablet Take 1 tablet (6.25 mg total) by mouth 2 (two) times daily. 60 tablet 3   ezetimibe (ZETIA) 10 MG tablet Take 10 mg by mouth daily. (Patient not taking: Reported on 05/30/2022)     gabapentin (NEURONTIN) 100 MG capsule Take 1 capsule (100 mg total) by mouth at bedtime. 30 capsule 0   hydroquinone 4 % cream Apply topically 2 (two) times daily.     hydrOXYzine (ATARAX) 25 MG tablet Take 25 mg by mouth every 8 (eight) hours as needed for anxiety.     losartan (COZAAR) 50 MG tablet Take 50 mg by mouth daily.     ondansetron (ZOFRAN) 4 MG tablet Take 1 tablet (4 mg total) by mouth every 8 (eight) hours as needed for nausea or vomiting. 30 tablet 1   potassium chloride (KLOR-CON) 20 MEQ  packet Take 20 mEq by mouth daily. 30 packet 1   spironolactone (ALDACTONE) 25 MG tablet Take 1 tablet (25 mg total) by mouth daily. 30 tablet 6   traMADol (ULTRAM) 50 MG tablet Take 1 tablet (50 mg total) by mouth every 6 (six) hours as needed for moderate pain or severe pain. 30 tablet 0   No current facility-administered medications for this encounter.    Physical Findings: The patient is in no acute distress. Patient is alert and oriented.  vitals were not taken for this visit. .  No significant changes. Lungs are clear to auscultation bilaterally. Heart has regular rate and rhythm. No palpable cervical, supraclavicular, or axillary adenopathy. Abdomen soft, non-tender, normal bowel  sounds.  Right breast: *** Left Breast: no palpable mass, nipple discharge or bleeding.    Lab Findings: Lab Results  Component Value Date   WBC 4.5 06/02/2022   HGB 11.7 (L) 06/02/2022   HCT 35.3 (L) 06/02/2022   MCV 73.1 (L) 06/02/2022   PLT 333 06/02/2022    Radiographic Findings: No results found.  Impression:  The encounter diagnosis was Malignant neoplasm of upper-outer quadrant of right breast in female, estrogen receptor negative ([place]).   Stage IIIB (cT4d, cN1, cM0) Right Breast UOQ, Invasive Lobular Carcinoma, ER- / PR- / Her2+, Grade 3; status post chemotherapy and bilateral mastectomies with no evidence of residual carcinoma   The patient is recovering from the effects of radiation.  ***  Plan:  ***   *** minutes of total time was spent for this patient encounter, including preparation, face-to-face counseling with the patient and coordination of care, physical exam, and documentation of the encounter. ____________________________________  Blair Promise, PhD, MD  This document serves as a record of services personally performed by Gery Pray, MD. It was created on his behalf by Roney Mans, a trained medical scribe. The creation of this record is based on the scribe's personal observations and the provider's statements to them. This document has been checked and approved by the attending provider.

## 2022-06-23 ENCOUNTER — Encounter: Payer: Self-pay | Admitting: *Deleted

## 2022-06-23 ENCOUNTER — Other Ambulatory Visit: Payer: Self-pay

## 2022-06-23 ENCOUNTER — Ambulatory Visit
Admission: RE | Admit: 2022-06-23 | Discharge: 2022-06-23 | Disposition: A | Discharge status: Home / Self Care | Payer: Medicare HMO | Source: Ambulatory Visit | Attending: Radiation Oncology | Admitting: Radiation Oncology

## 2022-06-23 ENCOUNTER — Inpatient Hospital Stay: Payer: Medicare HMO

## 2022-06-23 DIAGNOSIS — C50411 Malignant neoplasm of upper-outer quadrant of right female breast: Secondary | ICD-10-CM | POA: Diagnosis not present

## 2022-06-23 DIAGNOSIS — Z171 Estrogen receptor negative status [ER-]: Secondary | ICD-10-CM

## 2022-06-23 DIAGNOSIS — R112 Nausea with vomiting, unspecified: Secondary | ICD-10-CM | POA: Diagnosis not present

## 2022-06-23 DIAGNOSIS — Z79899 Other long term (current) drug therapy: Secondary | ICD-10-CM | POA: Diagnosis not present

## 2022-06-23 DIAGNOSIS — Z923 Personal history of irradiation: Secondary | ICD-10-CM | POA: Diagnosis not present

## 2022-06-23 DIAGNOSIS — Z5112 Encounter for antineoplastic immunotherapy: Secondary | ICD-10-CM | POA: Diagnosis not present

## 2022-06-23 DIAGNOSIS — R079 Chest pain, unspecified: Secondary | ICD-10-CM | POA: Diagnosis not present

## 2022-06-23 DIAGNOSIS — G629 Polyneuropathy, unspecified: Secondary | ICD-10-CM | POA: Diagnosis not present

## 2022-06-23 MED ORDER — SODIUM CHLORIDE 0.9% FLUSH
10.0000 mL | INTRAVENOUS | Status: DC | PRN
  Administered 2022-06-23: 10 mL

## 2022-06-23 MED ORDER — SODIUM CHLORIDE 0.9 % IV SOLN: Freq: Once | INTRAVENOUS | Status: AC

## 2022-06-23 MED ORDER — DIPHENHYDRAMINE HCL 25 MG PO CAPS
50.0000 mg | ORAL_CAPSULE | Freq: Once | ORAL | Status: AC
  Administered 2022-06-23: 50 mg via ORAL
  Filled 2022-06-23: qty 2

## 2022-06-23 MED ORDER — HEPARIN SOD (PORK) LOCK FLUSH 100 UNIT/ML IV SOLN
500.0000 [IU] | Freq: Once | INTRAVENOUS | Status: AC | PRN
  Administered 2022-06-23: 500 [IU]

## 2022-06-23 MED ORDER — TRASTUZUMAB-ANNS CHEMO 150 MG IV SOLR
6.0000 mg/kg | Freq: Once | INTRAVENOUS | Status: AC
  Administered 2022-06-23: 315 mg via INTRAVENOUS
  Filled 2022-06-23: qty 15

## 2022-06-23 MED ORDER — SODIUM CHLORIDE 0.9 % IV SOLN
420.0000 mg | Freq: Once | INTRAVENOUS | Status: AC
  Administered 2022-06-23: 420 mg via INTRAVENOUS
  Filled 2022-06-23: qty 14

## 2022-06-23 MED ORDER — ACETAMINOPHEN 325 MG PO TABS
650.0000 mg | ORAL_TABLET | Freq: Once | ORAL | Status: AC
  Administered 2022-06-23: 650 mg via ORAL
  Filled 2022-06-23: qty 2

## 2022-06-23 NOTE — Progress Notes (Signed)
Lynn Mcgee is here today for follow up post radiation to the breast.   Breast Side:Right   They completed their radiation on: 05/18/2022   Does the patient complain of any of the following: Post radiation skin issues: She reports that it is healing.  She said it is black and peeling. Using Radiaplex. Has not used silvadene yet.  Reports her skin is tight. Breast Tenderness: has some tenderness Breast Swelling: no Lymphadema: no Range of Motion limitations: no Fatigue post radiation: yes Appetite good/fair/poor: good  Additional comments if applicable: None noted.  BP (!) 168/77 (BP Location: Left Arm, Patient Position: Sitting, Cuff Size: Normal)   Pulse 71   Temp 97.7 F (36.5 C)   Resp 20   Ht '5\' 5"'$  (1.651 m)   Wt 111 lb 3.2 oz (50.4 kg)   LMP  (LMP Unknown)   SpO2 97%   BMI 18.50 kg/m

## 2022-06-23 NOTE — Patient Instructions (Signed)
[facility] ONCOLOGY  Discharge Instructions: Thank you for choosing [facility] to provide your oncology and hematology care.   If you have a lab appointment with the [facility], please go directly to the [facility] and check in at the registration area.   Wear comfortable clothing and clothing appropriate for easy access to any Portacath or PICC line.   We strive to give you quality time with your provider. You may need to reschedule your appointment if you arrive late (15 or more minutes).  Arriving late affects you and other patients whose appointments are after yours.  Also, if you miss three or more appointments without notifying the office, you may be dismissed from the clinic at the provider's discretion.      For prescription refill requests, have your pharmacy contact our office and allow 72 hours for refills to be completed.    Today you received the following chemotherapy and/or immunotherapy agents : Trastuzumab, Pertuzumab      To help prevent nausea and vomiting after your treatment, we encourage you to take your nausea medication as directed.  BELOW ARE SYMPTOMS THAT SHOULD BE REPORTED IMMEDIATELY: *FEVER GREATER THAN 100.4 F (38 C) OR HIGHER *CHILLS OR SWEATING *NAUSEA AND VOMITING THAT IS NOT CONTROLLED WITH YOUR NAUSEA MEDICATION *UNUSUAL SHORTNESS OF BREATH *UNUSUAL BRUISING OR BLEEDING *URINARY PROBLEMS (pain or burning when urinating, or frequent urination) *BOWEL PROBLEMS (unusual diarrhea, constipation, pain near the anus) TENDERNESS IN MOUTH AND THROAT WITH OR WITHOUT PRESENCE OF ULCERS (sore throat, sores in mouth, or a toothache) UNUSUAL RASH, SWELLING OR PAIN  UNUSUAL VAGINAL DISCHARGE OR ITCHING   Items with * indicate a potential emergency and should be followed up as soon as possible or go to the Emergency Department if any problems should occur.  Please show the CHEMOTHERAPY ALERT CARD or IMMUNOTHERAPY ALERT CARD  at check-in to the Emergency Department and triage nurse.  Should you have questions after your visit or need to cancel or reschedule your appointment, please contact [facility]  Dept: [phone]  and follow the prompts.  Office hours are 8:00 a.m. to 4:30 p.m. Monday - Friday. Please note that voicemails left after 4:00 p.m. may not be returned until the following business day.  We are closed weekends and major holidays. You have access to a nurse at all times for urgent questions. Please call the main number to the clinic Dept: [phone] and follow the prompts.   For any non-urgent questions, you may also contact your provider using MyChart. We now offer e-Visits for anyone 81 and older to request care online for non-urgent symptoms. For details visit [email or web address].   Also download the MyChart app! Go to the app store, search "MyChart", open the app, select [facility], and log in with your MyChart username and password.  Masks are optional in the cancer centers. If you would like for your care team to wear a mask while they are taking care of you, please let them know. For doctor visits, patients may have with them one support person who is at least 66 years old. At this time, visitors are not allowed in the infusion area.

## 2022-06-24 ENCOUNTER — Other Ambulatory Visit: Payer: Self-pay

## 2022-07-05 ENCOUNTER — Encounter: Payer: Self-pay | Admitting: Hematology and Oncology

## 2022-07-11 ENCOUNTER — Encounter (HOSPITAL_COMMUNITY): Payer: Self-pay | Admitting: Internal Medicine

## 2022-07-11 ENCOUNTER — Ambulatory Visit (HOSPITAL_BASED_OUTPATIENT_CLINIC_OR_DEPARTMENT_OTHER)
Admission: RE | Admit: 2022-07-11 | Discharge: 2022-07-11 | Disposition: A | Discharge status: Home / Self Care | Payer: Medicare HMO | Source: Ambulatory Visit | Attending: Internal Medicine | Admitting: Internal Medicine

## 2022-07-11 ENCOUNTER — Ambulatory Visit (HOSPITAL_COMMUNITY)
Admission: RE | Admit: 2022-07-11 | Discharge: 2022-07-11 | Disposition: A | Discharge status: Home / Self Care | Payer: Medicare HMO | Source: Ambulatory Visit | Attending: Internal Medicine | Admitting: Internal Medicine

## 2022-07-11 VITALS — BP 122/70 | HR 72 | Wt 112.0 lb

## 2022-07-11 DIAGNOSIS — Z171 Estrogen receptor negative status [ER-]: Secondary | ICD-10-CM

## 2022-07-11 DIAGNOSIS — I110 Hypertensive heart disease with heart failure: Secondary | ICD-10-CM | POA: Insufficient documentation

## 2022-07-11 DIAGNOSIS — Z720 Tobacco use: Secondary | ICD-10-CM

## 2022-07-11 DIAGNOSIS — I5022 Chronic systolic (congestive) heart failure: Secondary | ICD-10-CM | POA: Diagnosis not present

## 2022-07-11 DIAGNOSIS — C50919 Malignant neoplasm of unspecified site of unspecified female breast: Secondary | ICD-10-CM | POA: Insufficient documentation

## 2022-07-11 DIAGNOSIS — I2510 Atherosclerotic heart disease of native coronary artery without angina pectoris: Secondary | ICD-10-CM | POA: Diagnosis not present

## 2022-07-11 DIAGNOSIS — C50411 Malignant neoplasm of upper-outer quadrant of right female breast: Secondary | ICD-10-CM

## 2022-07-11 DIAGNOSIS — Z79899 Other long term (current) drug therapy: Secondary | ICD-10-CM | POA: Insufficient documentation

## 2022-07-11 DIAGNOSIS — Z0189 Encounter for other specified special examinations: Secondary | ICD-10-CM | POA: Diagnosis not present

## 2022-07-11 LAB — ECHOCARDIOGRAM COMPLETE
AR max vel: 1.85 cm2
AV Area VTI: 2.13 cm2
AV Area mean vel: 1.91 cm2
AV Mean grad: 8 mmHg
AV Peak grad: 15.5 mmHg
Ao pk vel: 1.97 m/s
Area-P 1/2: 3.85 cm2
Calc EF: 49.5 %
P 1/2 time: 618 ms
S' Lateral: 3.4 cm
Single Plane A2C EF: 55 %
Single Plane A4C EF: 44.7 %

## 2022-07-11 NOTE — Progress Notes (Signed)
  Echocardiogram 2D Echocardiogram has been performed.  Merrie Roof F 07/11/2022, 3:26 PM

## 2022-07-11 NOTE — Progress Notes (Signed)
ADVANCED [facility] CLINIC  NOTE  Referring Physician: Dr Lindi Adie  Primary Care:Dr Osei Bonsu Primary Cardiologist: none  Oncology: Dr Lindi Adie.   HPI: Ms Eliasen is a 66 year old with history of HTN, smoker, breast cancer, chronic systolic HF  Diagnosed with HF in 10/21 at [facility]. - Echo 10/21 ([place]) EF 40-45% - Myoview 10/21 EF 36% ? anterior wall defect -> treated medically   Subsequently found to have breast CA. Followed by Dr Lindi Adie - Stage IIIB (cT4d, cN1, cM0, G3, ER-, PR-, HER2+).   Pre-chemo echo 10/22: EF 45-50% , Grade I DD RV normal   Started Docetaxel + Carboplatin + Trastuzumab + Pertuzumab  (TCHP) q21d on 09/23/21.   Scans on 09/28/21 no sign of metastatic disease.   Echo 11/09/21; EF 45-50% [facility]. [name] was increased to 25 mg daily   Returns today for f/u. Has finished chemo. Now getting Herceptin/Perjeta q3 weeks - will finish in October. Has had bilateral mastectomy. No CP or SOB. "Just tired" Smokes 1/2 PPD   Echo today 07/11/22 EF 45-50%    SH: smokes 10 cigarettes per day. No alcohol. Occasionally smokes marijuana.   FH: Brother heart failure. Mom HTN   Past Medical History:  Diagnosis Date   Cancer [facility])    right breast cancer   CHF (congestive heart failure) ([place])    per patient   Heart murmur    pt states MD stated she had heart murmur "years ago"   History of radiation therapy    Right chest wall 04/06/22-05/18/22- Dr. Gery Pray   Hypertension     Current Outpatient Medications  Medication Sig Dispense Refill   acetaminophen (TYLENOL) 500 MG tablet Take 1,000 mg by mouth every 6 (six) hours as needed for mild pain.     amLODipine (NORVASC) 5 MG tablet Take 1 tablet (5 mg total) by mouth daily. 90 tablet 3   atorvastatin (LIPITOR) 40 MG tablet Take 40 mg by mouth daily.     carvedilol (COREG) 6.25 MG tablet Take 1 tablet (6.25 mg total) by mouth 2 (two) times daily. 60 tablet 3   hydroquinone 4 % cream Apply topically 2 (two) times  daily.     hydrOXYzine (ATARAX) 25 MG tablet Take 25 mg by mouth every 8 (eight) hours as needed for anxiety.     losartan (COZAAR) 50 MG tablet Take 50 mg by mouth daily.     potassium chloride (KLOR-CON) 20 MEQ packet Take 20 mEq by mouth daily. 30 packet 1   spironolactone (ALDACTONE) 25 MG tablet Take 1 tablet (25 mg total) by mouth daily. 30 tablet 6   traMADol (ULTRAM) 50 MG tablet Take 1 tablet (50 mg total) by mouth every 6 (six) hours as needed for moderate pain or severe pain. 30 tablet 0   No current facility-administered medications for this encounter.    No Known Allergies    Social History   Socioeconomic History   Marital status: Single    Spouse name: Not on file   Number of children: Not on file   Years of education: Not on file   Highest education level: Not on file  Occupational History   Not on file  Tobacco Use   Smoking status: Every Day    Packs/day: 0.50    Years: 2.00    Total pack years: 1.00    Types: Cigarettes   Smokeless tobacco: Never  Vaping Use   Vaping Use: Never used  Substance and Sexual  Activity   Alcohol use: Not Currently    Comment: "way back"   Drug use: Yes    Types: Marijuana    Comment: "occasionally" every couple of months   Sexual activity: Not on file  Other Topics Concern   Not on file  Social History Narrative   Not on file   Social Determinants of Health   Financial Resource Strain: Not on file  Food Insecurity: Not on file  Transportation Needs: Not on file  Physical Activity: Not on file  Stress: Not on file  Social Connections: Not on file  Intimate Partner Violence: Not on file     Vitals:   07/11/22 1531  BP: 122/70  Pulse: 72  SpO2: 96%  Weight: 50.8 kg (112 lb)     PHYSICAL EXAM: General:  Elderly thin No resp difficulty HEENT: normal Neck: supple. no JVD. Carotids 2+ bilat; no bruits. No lymphadenopathy or thryomegaly appreciated. Cor: PMI nondisplaced. Regular rate & rhythm. No rubs,  gallops or murmurs. Lungs: decreased throughout Abdomen: soft, nontender, nondistended. No hepatosplenomegaly. No bruits or masses. Good bowel sounds. Extremities: no cyanosis, clubbing, rash, edema Neuro: alert & orientedx3, cranial nerves grossly intact. moves all 4 extremities w/o difficulty. Affect pleasant   ASSESSMENT & PLAN:  1. Chronic systolic HF - Echo 43/60 ([place]) EF 40-45% - Myoview 10/21 EF 36% ? anterior wall defect -> treated medically - CT chest  10/22 + 3v coronary calcium - Echo 10/22 (pre-chemo) EF 45-50% No RWMA  - Echo 11/09/21 EF 50-55%  - Echo 5/23 EF 45-50% - Echo today 07/11/22 EF 45-50%  - Stable NYHA I-II Volume status stable. No diuretics - Continue losartan100 mg daily - Continue spiro 25 mg daily - Continue carvedilol at 6.25 mg  bid - LV dysfunction predated chemotherapy. 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 Does have evidence of 3v CAD on CT scan.  - Will get cardiac CTA to more clearly define coronary anatomy and rule out high-grade proximal disease as cause of her cardiomyopathy  2. Breast Cancer  - Stage IIIB (cT4d, cN1, cM0, G3, ER-, PR-, HER2+)  - Had Echo 09/22/21 EF 45-50% -->Pre chemo - Started on  Docetaxel + Carboplatin + Trastuzumab + Pertuzumab  (TCHP) q21d  on 09/23/21 - Echo 11/09/21 EF 50-55%  - Echo 5/23 EF 45-50% - Echo today 07/11/22 EF 45-50%  - Continue herceptin/perjeta  3. HTN  -Blood pressure well controlled. Continue current regimen.  4. Tobacco Abuse - discussed need for cessation  Glori Bickers, MD  3:35 PM

## 2022-07-11 NOTE — Patient Instructions (Signed)
Continue current medications  Your physician has requested that you have cardiac CT. Cardiac computed tomography (CT) is a painless test that uses an x-ray machine to take clear, detailed pictures of your heart. For further information please visit [email or web address]. Please follow instruction sheet as given. ONCE APPROVED BY INSURANCE WE WILL CALL  YOU TO SCHEDULE  Your physician recommends that you schedule a follow-up appointment in: 3 months with an echocardiogram  If you have any questions or concerns before your next appointment please send [place] a message through [place] or call our office at [phone].    TO LEAVE A MESSAGE FOR THE NURSE SELECT OPTION 2, PLEASE LEAVE A MESSAGE INCLUDING: YOUR NAME DATE OF BIRTH CALL BACK NUMBER REASON FOR CALL**this is important as we prioritize the call backs  YOU WILL RECEIVE A CALL BACK THE SAME DAY AS LONG AS YOU CALL BEFORE 4:00 PM  At the [facility], you and your health needs are our priority. As part of our continuing mission to provide you with exceptional heart care, we have created designated Provider Care Teams. These Care Teams include your primary Cardiologist (physician) and Advanced Practice Providers (APPs- Physician Assistants and Nurse Practitioners) who all work together to provide you with the care you need, when you need it.   You may see any of the following providers on your designated Care Team at your next follow up: Dr Glori Bickers Dr Haynes Kerns, NP Lyda Jester, [place] [facility], [place] Audry Riles, PharmD   Please be sure to bring in all your medications bottles to every appointment.

## 2022-07-13 ENCOUNTER — Other Ambulatory Visit: Payer: Self-pay

## 2022-07-14 ENCOUNTER — Telehealth (HOSPITAL_COMMUNITY): Payer: Self-pay | Admitting: *Deleted

## 2022-07-15 ENCOUNTER — Inpatient Hospital Stay: Payer: Medicare HMO

## 2022-07-15 ENCOUNTER — Inpatient Hospital Stay: Payer: Medicare HMO | Admitting: Hematology and Oncology

## 2022-07-15 ENCOUNTER — Telehealth: Payer: Self-pay | Admitting: *Deleted

## 2022-07-15 NOTE — Assessment & Plan Note (Deleted)
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 Neoadjuvant chemotherapy withTCHP 2.02/22/2022:Bilateral mastectomies: Pathologic complete response, 6 lymph nodes negative 3. Followed by adjuvant radiation therapy 04/07/2022-05/18/2022 --------------------------------------------------------------------------------------------------------------------------------- Current treatment: Herceptin Perjeta maintenance Toxicities: Echocardiogram 07/11/2022 EF 47 % (previous echo December 2022: EF 45 to 50%) Continue with her current treatment Follow-up with cardiology  Peripheral neuropathy: 5 out of 10: She would like to learn more about the neuropathy clinical trial.  She tells is that the gabapentin is not helping her.  She is willing to stop it if she goes on the clinical trial.  Return to clinic every 3 weeks for Herceptin Perjeta maintenance every 6 weeks for follow-up with [place]

## 2022-07-15 NOTE — Telephone Encounter (Signed)
Called pt to f/u on missed appt. Unable to leave voice message due to it being full. Called sister who stated that she had spoken with pt 30 min ago. Request that pt call facility and reschedule appt. Sister stated she will relay message

## 2022-07-20 ENCOUNTER — Other Ambulatory Visit: Payer: Self-pay

## 2022-07-20 ENCOUNTER — Inpatient Hospital Stay: Payer: Medicare HMO | Attending: Hematology and Oncology

## 2022-07-20 VITALS — BP 151/86 | HR 70 | Temp 97.8°F | Resp 18 | Wt 113.5 lb

## 2022-07-20 DIAGNOSIS — Z5112 Encounter for antineoplastic immunotherapy: Secondary | ICD-10-CM | POA: Diagnosis not present

## 2022-07-20 DIAGNOSIS — C50411 Malignant neoplasm of upper-outer quadrant of right female breast: Secondary | ICD-10-CM | POA: Diagnosis not present

## 2022-07-20 DIAGNOSIS — Z171 Estrogen receptor negative status [ER-]: Secondary | ICD-10-CM

## 2022-07-20 MED ORDER — TRASTUZUMAB-ANNS CHEMO 150 MG IV SOLR
6.0000 mg/kg | Freq: Once | INTRAVENOUS | Status: AC
  Administered 2022-07-20: 315 mg via INTRAVENOUS
  Filled 2022-07-20: qty 15

## 2022-07-20 MED ORDER — SODIUM CHLORIDE 0.9 % IV SOLN: Freq: Once | INTRAVENOUS | Status: AC

## 2022-07-20 MED ORDER — HEPARIN SOD (PORK) LOCK FLUSH 100 UNIT/ML IV SOLN
500.0000 [IU] | Freq: Once | INTRAVENOUS | Status: AC | PRN
  Administered 2022-07-20: 500 [IU]

## 2022-07-20 MED ORDER — SODIUM CHLORIDE 0.9 % IV SOLN
420.0000 mg | Freq: Once | INTRAVENOUS | Status: AC
  Administered 2022-07-20: 420 mg via INTRAVENOUS
  Filled 2022-07-20: qty 14

## 2022-07-20 MED ORDER — DIPHENHYDRAMINE HCL 25 MG PO CAPS
50.0000 mg | ORAL_CAPSULE | Freq: Once | ORAL | Status: AC
  Administered 2022-07-20: 50 mg via ORAL
  Filled 2022-07-20: qty 2

## 2022-07-20 MED ORDER — ACETAMINOPHEN 325 MG PO TABS
650.0000 mg | ORAL_TABLET | Freq: Once | ORAL | Status: AC
  Administered 2022-07-20: 650 mg via ORAL
  Filled 2022-07-20: qty 2

## 2022-07-20 MED ORDER — SODIUM CHLORIDE 0.9% FLUSH
10.0000 mL | INTRAVENOUS | Status: DC | PRN
  Administered 2022-07-20: 10 mL

## 2022-07-20 NOTE — Progress Notes (Signed)
Per Dr. Lindi Adie, okay to treat with Echo 8/14 EF 47%

## 2022-07-20 NOTE — Patient Instructions (Signed)
[facility] ONCOLOGY  Discharge Instructions: Thank you for choosing [facility] to provide your oncology and hematology care.   If you have a lab appointment with the [facility], please go directly to the [facility] and check in at the registration area.   Wear comfortable clothing and clothing appropriate for easy access to any Portacath or PICC line.   We strive to give you quality time with your provider. You may need to reschedule your appointment if you arrive late (15 or more minutes).  Arriving late affects you and other patients whose appointments are after yours.  Also, if you miss three or more appointments without notifying the office, you may be dismissed from the clinic at the provider's discretion.      For prescription refill requests, have your pharmacy contact our office and allow 72 hours for refills to be completed.    Today you received the following chemotherapy and/or immunotherapy agents : Trastuzumab, Pertuzumab      To help prevent nausea and vomiting after your treatment, we encourage you to take your nausea medication as directed.  BELOW ARE SYMPTOMS THAT SHOULD BE REPORTED IMMEDIATELY: *FEVER GREATER THAN 100.4 F (38 C) OR HIGHER *CHILLS OR SWEATING *NAUSEA AND VOMITING THAT IS NOT CONTROLLED WITH YOUR NAUSEA MEDICATION *UNUSUAL SHORTNESS OF BREATH *UNUSUAL BRUISING OR BLEEDING *URINARY PROBLEMS (pain or burning when urinating, or frequent urination) *BOWEL PROBLEMS (unusual diarrhea, constipation, pain near the anus) TENDERNESS IN MOUTH AND THROAT WITH OR WITHOUT PRESENCE OF ULCERS (sore throat, sores in mouth, or a toothache) UNUSUAL RASH, SWELLING OR PAIN  UNUSUAL VAGINAL DISCHARGE OR ITCHING   Items with * indicate a potential emergency and should be followed up as soon as possible or go to the Emergency Department if any problems should occur.  Please show the CHEMOTHERAPY ALERT CARD or IMMUNOTHERAPY ALERT CARD  at check-in to the Emergency Department and triage nurse.  Should you have questions after your visit or need to cancel or reschedule your appointment, please contact [facility]  Dept: [phone]  and follow the prompts.  Office hours are 8:00 a.m. to 4:30 p.m. Monday - Friday. Please note that voicemails left after 4:00 p.m. may not be returned until the following business day.  We are closed weekends and major holidays. You have access to a nurse at all times for urgent questions. Please call the main number to the clinic Dept: [phone] and follow the prompts.   For any non-urgent questions, you may also contact your provider using MyChart. We now offer e-Visits for anyone 23 and older to request care online for non-urgent symptoms. For details visit [email or web address].   Also download the MyChart app! Go to the app store, search "MyChart", open the app, select [place] [facility], and log in with your MyChart username and password.  Masks are optional in the cancer centers. If you would like for your care team to wear a mask while they are taking care of you, please let them know. For doctor visits, patients may have with them one support Ameet Sandy who is at least 66 years old. At this time, visitors are not allowed in the infusion area.

## 2022-07-21 ENCOUNTER — Other Ambulatory Visit: Payer: Self-pay

## 2022-07-25 ENCOUNTER — Telehealth: Payer: Self-pay | Admitting: Hematology and Oncology

## 2022-07-25 NOTE — Telephone Encounter (Signed)
Rescheduled appointments per 8/23 staff message. Unable to leave a voicemail due to patients mailbox being full. Patient will be mailed an updated calendar.

## 2022-08-05 ENCOUNTER — Inpatient Hospital Stay: Payer: Medicare HMO

## 2022-08-05 ENCOUNTER — Telehealth: Payer: Self-pay | Admitting: Hematology and Oncology

## 2022-08-05 NOTE — Telephone Encounter (Signed)
Called patient to remind her of her upcoming appointment as well as adjusting the other existing appointments. Left voicemail.

## 2022-08-08 ENCOUNTER — Telehealth (HOSPITAL_COMMUNITY): Payer: Self-pay | Admitting: *Deleted

## 2022-08-08 NOTE — Telephone Encounter (Signed)
Attempted to call patient regarding upcoming cardiac CT appointment. °Left message on voicemail with name and callback number ° °Lilyanne Mcquown RN Navigator Cardiac Imaging °[facility] °[phone] Office °[phone] Cell ° °

## 2022-08-09 ENCOUNTER — Ambulatory Visit (HOSPITAL_COMMUNITY)
Admission: RE | Admit: 2022-08-09 | Discharge: 2022-08-09 | Disposition: A | Discharge status: Home / Self Care | Payer: Medicare HMO | Source: Ambulatory Visit | Attending: Cardiology | Admitting: Cardiology

## 2022-08-09 ENCOUNTER — Ambulatory Visit (HOSPITAL_BASED_OUTPATIENT_CLINIC_OR_DEPARTMENT_OTHER)
Admission: RE | Admit: 2022-08-09 | Discharge: 2022-08-09 | Disposition: A | Discharge status: Home / Self Care | Payer: Medicare HMO | Source: Ambulatory Visit | Attending: Cardiology | Admitting: Cardiology

## 2022-08-09 ENCOUNTER — Ambulatory Visit (HOSPITAL_COMMUNITY)
Admission: RE | Admit: 2022-08-09 | Discharge: 2022-08-09 | Disposition: A | Discharge status: Home / Self Care | Payer: Medicare HMO | Source: Ambulatory Visit | Attending: Internal Medicine | Admitting: Internal Medicine

## 2022-08-09 ENCOUNTER — Other Ambulatory Visit: Payer: Self-pay | Admitting: Cardiology

## 2022-08-09 DIAGNOSIS — R931 Abnormal findings on diagnostic imaging of heart and coronary circulation: Secondary | ICD-10-CM | POA: Insufficient documentation

## 2022-08-09 DIAGNOSIS — I2510 Atherosclerotic heart disease of native coronary artery without angina pectoris: Secondary | ICD-10-CM

## 2022-08-09 MED ORDER — IOHEXOL 350 MG/ML SOLN
100.0000 mL | Freq: Once | INTRAVENOUS | Status: AC | PRN
  Administered 2022-08-09: 100 mL via INTRAVENOUS

## 2022-08-09 MED ORDER — NITROGLYCERIN 0.4 MG SL SUBL
SUBLINGUAL_TABLET | SUBLINGUAL | Status: AC
  Filled 2022-08-09: qty 2

## 2022-08-09 MED ORDER — NITROGLYCERIN 0.4 MG SL SUBL
0.8000 mg | SUBLINGUAL_TABLET | Freq: Once | SUBLINGUAL | Status: AC
  Administered 2022-08-09: 0.8 mg via SUBLINGUAL

## 2022-08-10 ENCOUNTER — Inpatient Hospital Stay: Payer: Medicare HMO | Attending: Hematology and Oncology

## 2022-08-10 ENCOUNTER — Other Ambulatory Visit: Payer: Self-pay

## 2022-08-10 ENCOUNTER — Inpatient Hospital Stay: Payer: Medicare HMO

## 2022-08-10 ENCOUNTER — Other Ambulatory Visit: Payer: Self-pay | Admitting: *Deleted

## 2022-08-10 ENCOUNTER — Inpatient Hospital Stay (HOSPITAL_BASED_OUTPATIENT_CLINIC_OR_DEPARTMENT_OTHER): Payer: Medicare HMO | Admitting: Physician Assistant

## 2022-08-10 VITALS — BP 159/85 | HR 56 | Temp 97.7°F | Resp 18

## 2022-08-10 VITALS — BP 142/89 | HR 61 | Temp 97.7°F | Resp 17

## 2022-08-10 VITALS — BP 166/62 | Wt 115.3 lb

## 2022-08-10 DIAGNOSIS — D649 Anemia, unspecified: Secondary | ICD-10-CM | POA: Insufficient documentation

## 2022-08-10 DIAGNOSIS — Z171 Estrogen receptor negative status [ER-]: Secondary | ICD-10-CM | POA: Insufficient documentation

## 2022-08-10 DIAGNOSIS — C50411 Malignant neoplasm of upper-outer quadrant of right female breast: Secondary | ICD-10-CM | POA: Insufficient documentation

## 2022-08-10 DIAGNOSIS — Z5112 Encounter for antineoplastic immunotherapy: Secondary | ICD-10-CM | POA: Diagnosis not present

## 2022-08-10 DIAGNOSIS — I10 Essential (primary) hypertension: Secondary | ICD-10-CM

## 2022-08-10 DIAGNOSIS — Z95828 Presence of other vascular implants and grafts: Secondary | ICD-10-CM

## 2022-08-10 LAB — CBC WITH DIFFERENTIAL (CANCER CENTER ONLY)
Abs Immature Granulocytes: 0.01 10*3/uL (ref 0.00–0.07)
Basophils Absolute: 0 10*3/uL (ref 0.0–0.1)
Basophils Relative: 1 %
Eosinophils Absolute: 0.1 10*3/uL (ref 0.0–0.5)
Eosinophils Relative: 3 %
HCT: 33.7 % — ABNORMAL LOW (ref 36.0–46.0)
Hemoglobin: 10.9 g/dL — ABNORMAL LOW (ref 12.0–15.0)
Immature Granulocytes: 0 %
Lymphocytes Relative: 43 %
Lymphs Abs: 1.8 10*3/uL (ref 0.7–4.0)
MCH: 24.6 pg — ABNORMAL LOW (ref 26.0–34.0)
MCHC: 32.3 g/dL (ref 30.0–36.0)
MCV: 76.1 fL — ABNORMAL LOW (ref 80.0–100.0)
Monocytes Absolute: 0.5 10*3/uL (ref 0.1–1.0)
Monocytes Relative: 11 %
Neutro Abs: 1.8 10*3/uL (ref 1.7–7.7)
Neutrophils Relative %: 42 %
Platelet Count: 277 10*3/uL (ref 150–400)
RBC: 4.43 MIL/uL (ref 3.87–5.11)
RDW: 16.1 % — ABNORMAL HIGH (ref 11.5–15.5)
WBC Count: 4.2 10*3/uL (ref 4.0–10.5)
nRBC: 0 % (ref 0.0–0.2)

## 2022-08-10 LAB — CMP (CANCER CENTER ONLY)
ALT: 9 U/L (ref 0–44)
AST: 15 U/L (ref 15–41)
Albumin: 3.7 g/dL (ref 3.5–5.0)
Alkaline Phosphatase: 82 U/L (ref 38–126)
Anion gap: 6 (ref 5–15)
BUN: 20 mg/dL (ref 8–23)
CO2: 24 mmol/L (ref 22–32)
Calcium: 8.7 mg/dL — ABNORMAL LOW (ref 8.9–10.3)
Chloride: 110 mmol/L (ref 98–111)
Creatinine: 0.69 mg/dL (ref 0.44–1.00)
GFR, Estimated: >60 mL/min (ref >60)
Glucose, Bld: 90 mg/dL (ref 70–99)
Potassium: 4 mmol/L (ref 3.5–5.1)
Sodium: 140 mmol/L (ref 135–145)
Total Bilirubin: 0.5 mg/dL (ref 0.3–1.2)
Total Protein: 7 g/dL (ref 6.5–8.1)

## 2022-08-10 LAB — MAGNESIUM: Magnesium: 2 mg/dL (ref 1.7–2.4)

## 2022-08-10 MED ORDER — SODIUM CHLORIDE 0.9 % IV SOLN: Freq: Once | INTRAVENOUS | Status: AC

## 2022-08-10 MED ORDER — TRASTUZUMAB-ANNS CHEMO 150 MG IV SOLR
6.0000 mg/kg | Freq: Once | INTRAVENOUS | Status: AC
  Administered 2022-08-10: 315 mg via INTRAVENOUS
  Filled 2022-08-10: qty 15

## 2022-08-10 MED ORDER — HEPARIN SOD (PORK) LOCK FLUSH 100 UNIT/ML IV SOLN
500.0000 [IU] | Freq: Once | INTRAVENOUS | Status: AC | PRN
  Administered 2022-08-10: 500 [IU]

## 2022-08-10 MED ORDER — DIPHENHYDRAMINE HCL 25 MG PO CAPS
50.0000 mg | ORAL_CAPSULE | Freq: Once | ORAL | Status: AC
  Administered 2022-08-10: 50 mg via ORAL
  Filled 2022-08-10: qty 2

## 2022-08-10 MED ORDER — SODIUM CHLORIDE 0.9% FLUSH
10.0000 mL | INTRAVENOUS | Status: DC | PRN
  Administered 2022-08-10: 10 mL

## 2022-08-10 MED ORDER — SODIUM CHLORIDE 0.9% FLUSH
10.0000 mL | Freq: Once | INTRAVENOUS | Status: AC
  Administered 2022-08-10: 10 mL

## 2022-08-10 MED ORDER — SODIUM CHLORIDE 0.9 % IV SOLN
420.0000 mg | Freq: Once | INTRAVENOUS | Status: AC
  Administered 2022-08-10: 420 mg via INTRAVENOUS
  Filled 2022-08-10: qty 14

## 2022-08-10 MED ORDER — ACETAMINOPHEN 325 MG PO TABS
650.0000 mg | ORAL_TABLET | Freq: Once | ORAL | Status: AC
  Administered 2022-08-10: 650 mg via ORAL
  Filled 2022-08-10: qty 2

## 2022-08-10 NOTE — Patient Instructions (Signed)
[facility] ONCOLOGY  Discharge Instructions: Thank you for choosing [facility] to provide your oncology and hematology care.   If you have a lab appointment with the Cancer Center, please go directly to the Cancer Center and check in at the registration area.   Wear comfortable clothing and clothing appropriate for easy access to any Portacath or PICC line.   We strive to give you quality time with your provider. You may need to reschedule your appointment if you arrive late (15 or more minutes).  Arriving late affects you and other patients whose appointments are after yours.  Also, if you miss three or more appointments without notifying the office, you may be dismissed from the clinic at the provider's discretion.      For prescription refill requests, have your pharmacy contact our office and allow 72 hours for refills to be completed.    Today you received the following chemotherapy and/or immunotherapy agents: trastuzumab and pertuzumab      To help prevent nausea and vomiting after your treatment, we encourage you to take your nausea medication as directed.  BELOW ARE SYMPTOMS THAT SHOULD BE REPORTED IMMEDIATELY: *FEVER GREATER THAN 100.4 F (38 C) OR HIGHER *CHILLS OR SWEATING *NAUSEA AND VOMITING THAT IS NOT CONTROLLED WITH YOUR NAUSEA MEDICATION *UNUSUAL SHORTNESS OF BREATH *UNUSUAL BRUISING OR BLEEDING *URINARY PROBLEMS (pain or burning when urinating, or frequent urination) *BOWEL PROBLEMS (unusual diarrhea, constipation, pain near the anus) TENDERNESS IN MOUTH AND THROAT WITH OR WITHOUT PRESENCE OF ULCERS (sore throat, sores in mouth, or a toothache) UNUSUAL RASH, SWELLING OR PAIN  UNUSUAL VAGINAL DISCHARGE OR ITCHING   Items with * indicate a potential emergency and should be followed up as soon as possible or go to the Emergency Department if any problems should occur.  Please show the CHEMOTHERAPY ALERT CARD or IMMUNOTHERAPY ALERT  CARD at check-in to the Emergency Department and triage nurse.  Should you have questions after your visit or need to cancel or reschedule your appointment, please contact [facility]  Dept: [phone]  and follow the prompts.  Office hours are 8:00 a.m. to 4:30 p.m. Monday - Friday. Please note that voicemails left after 4:00 p.m. may not be returned until the following business day.  We are closed weekends and major holidays. You have access to a nurse at all times for urgent questions. Please call the main number to the clinic Dept: [phone] and follow the prompts.   For any non-urgent questions, you may also contact your provider using MyChart. We now offer e-Visits for anyone 18 and older to request care online for non-urgent symptoms. For details visit [email or web address].   Also download the MyChart app! Go to the app store, search "MyChart", open the app, select [facility], and log in with your MyChart username and password.  Masks are optional in the cancer centers. If you would like for your care team to wear a mask while they are taking care of you, please let them know. You may have one support person who is at least 66 years old accompany you for your appointments. 

## 2022-08-10 NOTE — Progress Notes (Signed)
Lynn Mcgee Follow up:    Pcp, No No address on file   DIAGNOSIS:  Mcgee Staging  Malignant neoplasm of upper-outer quadrant of right breast in female, estrogen receptor negative ([place]) Staging form: Breast, AJCC 8th Edition - Clinical stage from 09/16/2021: Stage IIIB (cT4d, cN1, cM0, G3, ER-, PR-, HER2+) - Signed by Nicholas Lose, MD on 09/16/2021 Stage prefix: Initial diagnosis Histologic grading system: 3 grade system   SUMMARY OF ONCOLOGIC HISTORY: Oncology History  Malignant neoplasm of upper-outer quadrant of right breast in female, estrogen receptor negative ([place])  09/10/2021 Initial Diagnosis   Right breast thickening and swelling: Mammogram revealed extremely hard right breast with peau d'orange skin changes, ultrasound revealed very large irregular mass measuring 11 cm with diffuse skin thickening, 3 abnormal right axillary lymph nodes, biopsy revealed grade 3 invasive mammary Mcgee with pleomorphic features, lymph node positive, ER 0%, PR 0%, HER2 3+, Ki-67 30%   09/16/2021 Mcgee Staging   Staging form: Breast, AJCC 8th Edition - Clinical stage from 09/16/2021: Stage IIIB (cT4d, cN1, cM0, G3, ER-, PR-, HER2+) - Signed by Nicholas Lose, MD on 09/16/2021 Stage prefix: Initial diagnosis Histologic grading system: 3 grade system   09/24/2021 - 01/17/2022 Chemotherapy   Patient is on Treatment Plan : BREAST  Docetaxel + Carboplatin + Trastuzumab + Pertuzumab  (TCHP) q21d      02/04/2022 -  Chemotherapy   Patient is on Treatment Plan : BREAST Trastuzumab + Pertuzumab q21d     02/22/2022 Surgery   Bilateral mastectomies: Pathologic complete response, 6 lymph nodes negative   04/06/2022 - 05/18/2022 Radiation Therapy    04/06/2022 through 05/18/2022 Site Technique Total Dose (Gy) Dose per Fx (Gy) Completed Fx Beam Energies  Chest Wall, Right: CW_R 3D 50/50 2 25/25 6XFFF  Chest Wall, Right: CW_R_Bst Electron 10/10 2 5/5 6E  Sclav-RT: SCV_R 3D 50/50 2 25/25  6X, 10X       CURRENT THERAPY: herceptin/perjeta  INTERVAL HISTORY: Lynn Mcgee 66 y.o. female returns for a follow up before Cycle 9 of herceptin/perjeta.  She is unaccompanied for this visit.   Ms. Rawling reports that she is doing well without any new symptoms. Her energy and appetite are stable. She is able to complete her daily activities on her own. She denies nausea, vomiting or abdominal pain. Her bowel habits without any recurrent episodes of diarrhea or constipation. She reports twitching of her left eyelid periodically without any vision changes. She denies fevers, chills, sweats, shortness of breath, chest pain or cough.     Patient Active Problem List   Diagnosis Date Noted   S/P bilateral mastectomy 02/22/2022   Vitamin D deficiency 11/26/2021   Mixed hyperlipidemia 11/26/2021   Anxiety 11/26/2021   Port-A-Cath in place 09/30/2021   Malignant neoplasm of upper-outer quadrant of right breast in female, estrogen receptor negative ([place]) 09/16/2021   Hypertension 09/14/2021   Tobacco use 03/08/2021   Renal artery stenosis ([place]) 03/08/2021   Coronary artery disease involving native coronary artery of native heart without angina pectoris 03/08/2021   Acute congestive heart failure ([place]) 08/30/2020   Porokeratosis 10/17/2014   Verruca 10/17/2014   Pain in lower limb 10/17/2014    has No Known Allergies.  MEDICAL HISTORY: Past Medical History:  Diagnosis Date   Mcgee [facility])    right breast Mcgee   CHF (congestive heart failure) ([place])    per patient   Heart murmur    pt states MD stated she had heart murmur "years ago"  History of radiation therapy    Right chest wall 04/06/22-05/18/22- Dr. Gery Pray   Hypertension     SURGICAL HISTORY: Past Surgical History:  Procedure Laterality Date   ABDOMINAL HYSTERECTOMY     CARDIAC CATHETERIZATION     2021   IR IMAGING GUIDED PORT INSERTION  09/17/2021   MODIFIED MASTECTOMY Right 02/22/2022   Procedure: RIGHT  MODIFIED RADICAL MASTECTOMY;  Surgeon: [name], MD;  Location: [place];  Service: General;  Laterality: Right;   TOTAL MASTECTOMY Left 02/22/2022   Procedure: LEFT TOTAL MASTECTOMY;  Surgeon: [name], MD;  Location: [place];  Service: General;  Laterality: Left;    SOCIAL HISTORY: Social History   Socioeconomic History   Marital status: Single    Spouse name: Not on file   Number of children: Not on file   Years of education: Not on file   Highest education level: Not on file  Occupational History   Not on file  Tobacco Use   Smoking status: Every Day    Packs/day: 0.50    Years: 2.00    Total pack years: 1.00    Types: Cigarettes   Smokeless tobacco: Never  Vaping Use   Vaping Use: Never used  Substance and Sexual Activity   Alcohol use: Not Currently    Comment: "way back"   Drug use: Yes    Types: Marijuana    Comment: "occasionally" every couple of months   Sexual activity: Not on file  Other Topics Concern   Not on file  Social History Narrative   Not on file   Social Determinants of Health   Financial Resource Strain: Not on file  Food Insecurity: Not on file  Transportation Needs: Not on file  Physical Activity: Not on file  Stress: Not on file  Social Connections: Not on file  Intimate Partner Violence: Not on file    FAMILY HISTORY: No family history on file.  Review of Systems  Constitutional:  Negative for appetite change, chills, fatigue, fever and unexpected weight change.  Respiratory:  Negative for cough and shortness of breath.   Cardiovascular:  Negative for chest pain, leg swelling and palpitations.  Gastrointestinal:  Negative for abdominal pain, constipation, diarrhea, nausea and vomiting.  Endocrine: Negative for hot flashes.  Genitourinary:  Negative for difficulty urinating.   Musculoskeletal:  Negative for arthralgias.  Skin:  Negative for itching and rash.  Neurological:  Negative for dizziness, extremity weakness,  headaches and numbness.  Hematological:  Negative for adenopathy. Does not bruise/bleed easily.  Psychiatric/Behavioral:  Negative for depression. The patient is not nervous/anxious.       PHYSICAL EXAMINATION  ECOG PERFORMANCE STATUS: 1 - Symptomatic but completely ambulatory  Vitals:   08/10/22 1351  BP: (!) 166/62    Physical Exam Constitutional:      General: She is not in acute distress.    Appearance: Normal appearance. She is not toxic-appearing.  HENT:     Head: Normocephalic and atraumatic.  Eyes:     General: No scleral icterus. Cardiovascular:     Rate and Rhythm: Normal rate and regular rhythm.     Pulses: Normal pulses.     Heart sounds: Normal heart sounds.  Pulmonary:     Effort: Pulmonary effort is normal.     Breath sounds: Normal breath sounds.  Abdominal:     General: Abdomen is flat. Bowel sounds are normal. There is no distension.     Palpations: Abdomen is soft.  Tenderness: There is no abdominal tenderness.  Musculoskeletal:        General: No swelling.     Cervical back: Neck supple.  Lymphadenopathy:     Cervical: No cervical adenopathy.  Skin:    General: Skin is warm and dry.     Findings: No rash.  Neurological:     General: No focal deficit present.     Mental Status: She is alert.  Psychiatric:        Mood and Affect: Mood normal.        Behavior: Behavior normal.     LABORATORY DATA:  CBC    Component Value Date/Time   WBC 4.2 08/10/2022 1325   WBC 6.0 02/15/2022 1131   RBC 4.43 08/10/2022 1325   HGB 10.9 (L) 08/10/2022 1325   HCT 33.7 (L) 08/10/2022 1325   PLT 277 08/10/2022 1325   MCV 76.1 (L) 08/10/2022 1325   MCH 24.6 (L) 08/10/2022 1325   MCHC 32.3 08/10/2022 1325   RDW 16.1 (H) 08/10/2022 1325   LYMPHSABS 1.8 08/10/2022 1325   MONOABS 0.5 08/10/2022 1325   EOSABS 0.1 08/10/2022 1325   BASOSABS 0.0 08/10/2022 1325    CMP     Component Value Date/Time   NA 140 08/10/2022 1325   K 4.0 08/10/2022 1325    CL 110 08/10/2022 1325   CO2 24 08/10/2022 1325   GLUCOSE 90 08/10/2022 1325   BUN 20 08/10/2022 1325   CREATININE 0.69 08/10/2022 1325   CALCIUM 8.7 (L) 08/10/2022 1325   PROT 7.0 08/10/2022 1325   ALBUMIN 3.7 08/10/2022 1325   AST 15 08/10/2022 1325   ALT 9 08/10/2022 1325   ALKPHOS 82 08/10/2022 1325   BILITOT 0.5 08/10/2022 1325   GFRNONAA >60 08/10/2022 1325      ASSESSMENT and THERAPY PLAN:   Lynn Mcgee is a 66 y.o. female who presents to the clinic for history of right breast Mcgee.    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 Neoadjuvant chemotherapy with TCHP 2. 02/22/2022:Bilateral mastectomies: Pathologic complete response, 6 lymph nodes negative 3. Followed by adjuvant radiation therapy 04/07/2022-05/18/2022 ------------------------------------------------------------------------------------------------- Current treatment: Herceptin/Perjeta maintenance, Cycle 9 today Toxicities: -Echocardiogram 07/11/2022 EF 45 to 50% (previous echo May 2023: EF 45 to 50%) -Under the care of Dr. Haroldine Laws from cardiology who recommends to continue with herceptin/perjeta after review of most recent echo.    Labs from today were reviewed and require no intervention. Stable anemia with Hgb 10.9,MCV 76.1. 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    Return to clinic for cycle 10 of Herceptin/Perjeta maintenance and SCP visit.    All questions were answered. The  patient knows to call the clinic with any problems, questions or concerns. We can certainly see the patient much sooner if necessary.  I have spent a total of 30 minutes minutes of face-to-face and non-face-to-face time, preparing to see the [place] a medically appropriate examination, counseling and educating the patient,documenting clinical information in the electronic health record, and care coordination.   Dede Query PA-C Dept of  Hematology and [place] at [facility] Phone: [phone]

## 2022-08-10 NOTE — Progress Notes (Signed)
Pt declined to stay for post 30 obs, ambulatory to lobby with VSS upon discharge 

## 2022-08-26 ENCOUNTER — Inpatient Hospital Stay: Payer: Medicare HMO

## 2022-08-26 ENCOUNTER — Inpatient Hospital Stay: Payer: Medicare HMO | Admitting: Adult Health

## 2022-09-01 ENCOUNTER — Inpatient Hospital Stay: Payer: Medicare HMO | Attending: Hematology and Oncology

## 2022-09-01 ENCOUNTER — Telehealth: Payer: Self-pay

## 2022-09-01 ENCOUNTER — Encounter: Payer: Self-pay | Admitting: Adult Health

## 2022-09-01 ENCOUNTER — Inpatient Hospital Stay: Payer: Medicare HMO

## 2022-09-01 ENCOUNTER — Inpatient Hospital Stay (HOSPITAL_BASED_OUTPATIENT_CLINIC_OR_DEPARTMENT_OTHER): Payer: Medicare HMO | Admitting: Adult Health

## 2022-09-01 ENCOUNTER — Other Ambulatory Visit: Payer: Self-pay

## 2022-09-01 VITALS — BP 188/95 | HR 62

## 2022-09-01 VITALS — BP 186/89 | HR 67 | Temp 98.1°F | Resp 16 | Ht 65.0 in | Wt 116.3 lb

## 2022-09-01 DIAGNOSIS — Z5112 Encounter for antineoplastic immunotherapy: Secondary | ICD-10-CM | POA: Insufficient documentation

## 2022-09-01 DIAGNOSIS — C50411 Malignant neoplasm of upper-outer quadrant of right female breast: Secondary | ICD-10-CM | POA: Diagnosis not present

## 2022-09-01 DIAGNOSIS — Z171 Estrogen receptor negative status [ER-]: Secondary | ICD-10-CM | POA: Diagnosis not present

## 2022-09-01 DIAGNOSIS — N898 Other specified noninflammatory disorders of vagina: Secondary | ICD-10-CM

## 2022-09-01 DIAGNOSIS — C773 Secondary and unspecified malignant neoplasm of axilla and upper limb lymph nodes: Secondary | ICD-10-CM | POA: Diagnosis not present

## 2022-09-01 DIAGNOSIS — Z1211 Encounter for screening for malignant neoplasm of colon: Secondary | ICD-10-CM | POA: Diagnosis not present

## 2022-09-01 DIAGNOSIS — Z95828 Presence of other vascular implants and grafts: Secondary | ICD-10-CM

## 2022-09-01 LAB — CBC WITH DIFFERENTIAL (CANCER CENTER ONLY)
Abs Immature Granulocytes: 0.01 10*3/uL (ref 0.00–0.07)
Basophils Absolute: 0 10*3/uL (ref 0.0–0.1)
Basophils Relative: 1 %
Eosinophils Absolute: 0.1 10*3/uL (ref 0.0–0.5)
Eosinophils Relative: 1 %
HCT: 35 % — ABNORMAL LOW (ref 36.0–46.0)
Hemoglobin: 11.3 g/dL — ABNORMAL LOW (ref 12.0–15.0)
Immature Granulocytes: 0 %
Lymphocytes Relative: 49 %
Lymphs Abs: 1.9 10*3/uL (ref 0.7–4.0)
MCH: 24.9 pg — ABNORMAL LOW (ref 26.0–34.0)
MCHC: 32.3 g/dL (ref 30.0–36.0)
MCV: 77.1 fL — ABNORMAL LOW (ref 80.0–100.0)
Monocytes Absolute: 0.6 10*3/uL (ref 0.1–1.0)
Monocytes Relative: 14 %
Neutro Abs: 1.3 10*3/uL — ABNORMAL LOW (ref 1.7–7.7)
Neutrophils Relative %: 35 %
Platelet Count: 288 10*3/uL (ref 150–400)
RBC: 4.54 MIL/uL (ref 3.87–5.11)
RDW: 15.8 % — ABNORMAL HIGH (ref 11.5–15.5)
WBC Count: 3.9 10*3/uL — ABNORMAL LOW (ref 4.0–10.5)
nRBC: 0 % (ref 0.0–0.2)

## 2022-09-01 LAB — CMP (CANCER CENTER ONLY)
ALT: 10 U/L (ref 0–44)
AST: 19 U/L (ref 15–41)
Albumin: 3.9 g/dL (ref 3.5–5.0)
Alkaline Phosphatase: 86 U/L (ref 38–126)
Anion gap: 5 (ref 5–15)
BUN: 7 mg/dL — ABNORMAL LOW (ref 8–23)
CO2: 29 mmol/L (ref 22–32)
Calcium: 9 mg/dL (ref 8.9–10.3)
Chloride: 105 mmol/L (ref 98–111)
Creatinine: 0.6 mg/dL (ref 0.44–1.00)
GFR, Estimated: >60 mL/min (ref >60)
Glucose, Bld: 98 mg/dL (ref 70–99)
Potassium: 3.4 mmol/L — ABNORMAL LOW (ref 3.5–5.1)
Sodium: 139 mmol/L (ref 135–145)
Total Bilirubin: 0.4 mg/dL (ref 0.3–1.2)
Total Protein: 7.2 g/dL (ref 6.5–8.1)

## 2022-09-01 MED ORDER — SODIUM CHLORIDE 0.9 % IV SOLN: Freq: Once | INTRAVENOUS | Status: AC

## 2022-09-01 MED ORDER — SODIUM CHLORIDE 0.9% FLUSH
10.0000 mL | Freq: Once | INTRAVENOUS | Status: AC
  Administered 2022-09-01: 10 mL

## 2022-09-01 MED ORDER — DIPHENHYDRAMINE HCL 25 MG PO CAPS
50.0000 mg | ORAL_CAPSULE | Freq: Once | ORAL | Status: AC
  Administered 2022-09-01: 50 mg via ORAL
  Filled 2022-09-01: qty 2

## 2022-09-01 MED ORDER — SODIUM CHLORIDE 0.9 % IV SOLN
420.0000 mg | Freq: Once | INTRAVENOUS | Status: AC
  Administered 2022-09-01: 420 mg via INTRAVENOUS
  Filled 2022-09-01: qty 14

## 2022-09-01 MED ORDER — HEPARIN SOD (PORK) LOCK FLUSH 100 UNIT/ML IV SOLN
500.0000 [IU] | Freq: Once | INTRAVENOUS | Status: AC | PRN
  Administered 2022-09-01: 500 [IU]

## 2022-09-01 MED ORDER — TRASTUZUMAB-ANNS CHEMO 150 MG IV SOLR
6.0000 mg/kg | Freq: Once | INTRAVENOUS | Status: AC
  Administered 2022-09-01: 315 mg via INTRAVENOUS
  Filled 2022-09-01: qty 15

## 2022-09-01 MED ORDER — ACETAMINOPHEN 325 MG PO TABS
650.0000 mg | ORAL_TABLET | Freq: Once | ORAL | Status: AC
  Administered 2022-09-01: 650 mg via ORAL
  Filled 2022-09-01: qty 2

## 2022-09-01 MED ORDER — SODIUM CHLORIDE 0.9% FLUSH
10.0000 mL | INTRAVENOUS | Status: DC | PRN
  Administered 2022-09-01: 10 mL

## 2022-09-01 NOTE — Telephone Encounter (Signed)
Attempted to call [facility] to obtain pt's health maintenance history. Left detailed message requesting fax of these records.

## 2022-09-01 NOTE — Progress Notes (Signed)
SURVIVORSHIP VISIT:  BRIEF ONCOLOGIC HISTORY:  Oncology History  Malignant neoplasm of upper-outer quadrant of right breast in female, estrogen receptor negative ([place])  09/10/2021 Initial Diagnosis   Right breast thickening and swelling: Mammogram revealed extremely hard right breast with peau d'orange skin changes, ultrasound revealed very large irregular mass measuring 11 cm with diffuse skin thickening, 3 abnormal right axillary lymph nodes, biopsy revealed grade 3 invasive mammary cancer with pleomorphic features, lymph node positive, ER 0%, PR 0%, HER2 3+, Ki-67 30%   09/16/2021 Cancer Staging   Staging form: Breast, AJCC 8th Edition - Clinical stage from 09/16/2021: Stage IIIB (cT4d, cN1, cM0, G3, ER-, PR-, HER2+) - Signed by Nicholas Lose, MD on 09/16/2021 Stage prefix: Initial diagnosis Histologic grading system: 3 grade system   09/24/2021 - 01/17/2022 Chemotherapy   Patient is on Treatment Plan : BREAST  Docetaxel + Carboplatin + Trastuzumab + Pertuzumab  (TCHP) q21d      02/04/2022 -  Chemotherapy   Patient is on Treatment Plan : BREAST Trastuzumab + Pertuzumab q21d     02/22/2022 Surgery   Bilateral mastectomies: Pathologic complete response, 6 lymph nodes negative   04/06/2022 - 05/18/2022 Radiation Therapy    04/06/2022 through 05/18/2022 Site Technique Total Dose (Gy) Dose per Fx (Gy) Completed Fx Beam Energies  Chest Wall, Right: CW_R 3D 50/50 2 25/25 6XFFF  Chest Wall, Right: CW_R_Bst Electron 10/10 2 5/5 6E  Sclav-RT: SCV_R 3D 50/50 2 25/25 6X, 10X      INTERVAL HISTORY:  Lynn Mcgee to review her survivorship care plan detailing her treatment course for breast cancer, as well as monitoring long-term side effects of that treatment, education regarding health maintenance, screening, and overall wellness and health promotion.     Overall, Lynn Mcgee reports feeling quite well.  She tells me that she did not take her blood pressure medication this morning and is hungry and has  not yet eaten.  She denies any significant issues otherwise and is feeling well.    REVIEW OF SYSTEMS:  Review of Systems  Constitutional:  Negative for appetite change, chills, fatigue, fever and unexpected weight change.  HENT:   Negative for hearing loss, lump/mass and trouble swallowing.   Eyes:  Negative for eye problems and icterus.  Respiratory:  Negative for chest tightness, cough and shortness of breath.   Cardiovascular:  Negative for chest pain, leg swelling and palpitations.  Gastrointestinal:  Negative for abdominal distention, abdominal pain, constipation, diarrhea, nausea and vomiting.  Endocrine: Negative for hot flashes.  Genitourinary:  Negative for difficulty urinating.   Musculoskeletal:  Negative for arthralgias.  Skin:  Negative for itching and rash.  Neurological:  Negative for dizziness, extremity weakness, headaches and numbness.  Hematological:  Negative for adenopathy. Does not bruise/bleed easily.  Psychiatric/Behavioral:  Negative for depression. The patient is not nervous/anxious.   Breast: Denies any new nodularity, masses, tenderness, nipple changes, or nipple discharge.    ONCOLOGY TREATMENT TEAM:  1. Surgeon:  Dr. Ninfa Linden at [facility] 2. Medical Oncologist: Dr. Lindi Adie  3. Radiation Oncologist: Dr. Sondra Come    PAST MEDICAL/SURGICAL HISTORY:  Past Medical History:  Diagnosis Date   Cancer [facility])    right breast cancer   CHF (congestive heart failure) ([place])    per patient   Heart murmur    pt states MD stated she had heart murmur "years ago"   History of radiation therapy    Right chest wall 04/06/22-05/18/22- Dr. Gery Pray   Hypertension  Past Surgical History:  Procedure Laterality Date   ABDOMINAL HYSTERECTOMY     CARDIAC CATHETERIZATION     2021   IR IMAGING GUIDED PORT INSERTION  09/17/2021   MODIFIED MASTECTOMY Right 02/22/2022   Procedure: RIGHT MODIFIED RADICAL MASTECTOMY;  Surgeon: [name], MD;  Location:  [place];  Service: General;  Laterality: Right;   TOTAL MASTECTOMY Left 02/22/2022   Procedure: LEFT TOTAL MASTECTOMY;  Surgeon: [name], MD;  Location: [place];  Service: General;  Laterality: Left;     ALLERGIES:  No Known Allergies   CURRENT MEDICATIONS:  Outpatient Encounter Medications as of 09/01/2022  Medication Sig   acetaminophen (TYLENOL) 500 MG tablet Take 1,000 mg by mouth every 6 (six) hours as needed for mild pain.   amLODipine (NORVASC) 5 MG tablet Take 1 tablet (5 mg total) by mouth daily.   atorvastatin (LIPITOR) 40 MG tablet Take 40 mg by mouth daily.   carvedilol (COREG) 6.25 MG tablet Take 1 tablet (6.25 mg total) by mouth 2 (two) times daily.   hydroquinone 4 % cream Apply topically 2 (two) times daily.   hydrOXYzine (ATARAX) 25 MG tablet Take 25 mg by mouth every 8 (eight) hours as needed for anxiety.   losartan (COZAAR) 50 MG tablet Take 50 mg by mouth daily.   potassium chloride (KLOR-CON) 20 MEQ packet Take 20 mEq by mouth daily.   spironolactone (ALDACTONE) 25 MG tablet Take 1 tablet (25 mg total) by mouth daily.   [DISCONTINUED] prochlorperazine (COMPAZINE) 10 MG tablet Take 1 tablet (10 mg total) by mouth every 6 (six) hours as needed (Nausea or vomiting).   [DISCONTINUED] traMADol (ULTRAM) 50 MG tablet Take 1 tablet (50 mg total) by mouth every 6 (six) hours as needed for moderate pain or severe pain.   No facility-administered encounter medications on file as of 09/01/2022.     ONCOLOGIC FAMILY HISTORY:  History reviewed. No pertinent family history.   SOCIAL HISTORY:  Social History   Socioeconomic History   Marital status: Single    Spouse name: Not on file   Number of children: Not on file   Years of education: Not on file   Highest education level: Not on file  Occupational History   Not on file  Tobacco Use   Smoking status: Every Day    Packs/day: 0.50    Years: 2.00    Total pack years: 1.00    Types: Cigarettes   Smokeless  tobacco: Never  Vaping Use   Vaping Use: Never used  Substance and Sexual Activity   Alcohol use: Not Currently    Comment: "way back"   Drug use: Yes    Types: Marijuana    Comment: "occasionally" every couple of months   Sexual activity: Not on file  Other Topics Concern   Not on file  Social History Narrative   Not on file   Social Determinants of Health   Financial Resource Strain: Not on file  Food Insecurity: Not on file  Transportation Needs: Not on file  Physical Activity: Not on file  Stress: Not on file  Social Connections: Not on file  Intimate Partner Violence: Not on file     OBSERVATIONS/OBJECTIVE:  BP (!) 186/89 (BP Location: Left Arm, Patient Position: Sitting) Comment: nurse notified  Pulse 67   Temp 98.1 F (36.7 C) (Temporal)   Resp 16   Ht _0  (1.651 m)   Wt 116 lb 4.8 oz (52.8 kg)   LMP  (LMP  Unknown)   SpO2 98%   BMI 19.35 kg/m  GENERAL: Patient is a well appearing female in no acute distress HEENT:  Sclerae anicteric.  Oropharynx clear and moist. No ulcerations or evidence of oropharyngeal candidiasis. Neck is supple.  NODES:  No cervical, supraclavicular, or axillary lymphadenopathy palpated.  BREAST EXAM:  Deferred. LUNGS:  Clear to auscultation bilaterally.  No wheezes or rhonchi. HEART:  Regular rate and rhythm. No murmur appreciated. ABDOMEN:  Soft, nontender.  Positive, normoactive bowel sounds. No organomegaly palpated. MSK:  No focal spinal tenderness to palpation. Full range of motion bilaterally in the upper extremities. EXTREMITIES:  No peripheral edema.   SKIN:  Clear with no obvious rashes or skin changes. No nail dyscrasia. NEURO:  Nonfocal. Well oriented.  Appropriate affect.  LABORATORY DATA:  None for this visit.  DIAGNOSTIC IMAGING:  None for this visit.      ASSESSMENT AND PLAN:  Ms.. Mcgee is a pleasant 66 y.o. 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  She presents to the Survivorship Clinic for our initial meeting and routine follow-up post-completion of treatment for breast cancer.    1. Stage 3B right breast cancer:  Lynn Mcgee is continuing to recover from definitive treatment for breast cancer. She will follow-up with her medical oncologist, Dr. Lindi Adie in 3 months with history and physical exam per surveillance protocol.    Today, a comprehensive survivorship care plan and treatment summary was reviewed with the patient today detailing her breast cancer diagnosis, treatment course, potential late/long-term effects of treatment, appropriate follow-up care with recommendations for the future, and patient education resources.  A copy of this summary, along with a letter will be sent to the patient's primary care provider via mail/fax/In Basket message after today's visit.    2. Bone health: I recommended that she undergo bone density testing.  She says that she is going to get a new primary care provider and follow-up with them on this.  She was given education on specific activities to promote bone health.  3. Cancer screening:  Due to Lynn Mcgee's history and her age, she should receive screening for skin cancers, colon cancer, and gynecologic cancers.  I placed a referral to GYN and to GI today.  The information and recommendations are listed on the patient's comprehensive care plan/treatment summary and were reviewed in detail with the patient.    4. Health maintenance and wellness promotion: Lynn Mcgee was encouraged to consume 5-7 servings of fruits and vegetables per day. We reviewed the "Nutrition Rainbow" handout.  She was also encouraged to engage in moderate to vigorous exercise for 30 minutes per day most days of the week. We discussed the [employer] fitness program, which is designed for cancer survivors to help them become more physically fit  after cancer treatments.  She was instructed to limit her alcohol consumption and was encouraged stop smoking.     5. Support services/counseling: It is not uncommon for this period of the patient's cancer care trajectory to be one of many emotions and stressors.  She was given information regarding our available services and encouraged to contact me with any questions or for help enrolling in any of our support group/programs.    Follow up instructions:    -Return to cancer center in 3 months for follow-up with Dr. Lindi Adie -She is welcome to return back to the Survivorship Clinic  at any time; no additional follow-up needed at this time.  -Consider referral back to survivorship as a long-term survivor for continued surveillance  The patient was provided an opportunity to ask questions and all were answered. The patient agreed with the plan and demonstrated an understanding of the instructions.   Total encounter time:40 minutes*in face-to-face visit time, chart review, lab review, care coordination, order entry, and documentation of the encounter time.    Wilber Bihari, NP 09/01/22 1:23 PM Medical Oncology and Hematology [facility] [place], [place] [address] Tel. [phone]    Fax. [phone]  *Total Encounter Time as defined by the Centers for Medicare and Medicaid Services includes, in addition to the face-to-face time of a patient visit (documented in the note above) non-face-to-face time: obtaining and reviewing outside history, ordering and reviewing medications, tests or procedures, care coordination (communications with other health care professionals or caregivers) and documentation in the medical record.

## 2022-09-01 NOTE — Patient Instructions (Signed)
[facility] MEDICAL ONCOLOGY  Discharge Instructions: Thank you for choosing [facility] to provide your oncology and hematology care.   If you have a lab appointment with the Cancer Center, please go directly to the Cancer Center and check in at the registration area.   Wear comfortable clothing and clothing appropriate for easy access to any Portacath or PICC line.   We strive to give you quality time with your provider. You may need to reschedule your appointment if you arrive late (15 or more minutes).  Arriving late affects you and other patients whose appointments are after yours.  Also, if you miss three or more appointments without notifying the office, you may be dismissed from the clinic at the provider's discretion.      For prescription refill requests, have your pharmacy contact our office and allow 72 hours for refills to be completed.    Today you received the following chemotherapy and/or immunotherapy agents: trastuzumab and pertuzumab      To help prevent nausea and vomiting after your treatment, we encourage you to take your nausea medication as directed.  BELOW ARE SYMPTOMS THAT SHOULD BE REPORTED IMMEDIATELY: *FEVER GREATER THAN 100.4 F (38 C) OR HIGHER *CHILLS OR SWEATING *NAUSEA AND VOMITING THAT IS NOT CONTROLLED WITH YOUR NAUSEA MEDICATION *UNUSUAL SHORTNESS OF BREATH *UNUSUAL BRUISING OR BLEEDING *URINARY PROBLEMS (pain or burning when urinating, or frequent urination) *BOWEL PROBLEMS (unusual diarrhea, constipation, pain near the anus) TENDERNESS IN MOUTH AND THROAT WITH OR WITHOUT PRESENCE OF ULCERS (sore throat, sores in mouth, or a toothache) UNUSUAL RASH, SWELLING OR PAIN  UNUSUAL VAGINAL DISCHARGE OR ITCHING   Items with * indicate a potential emergency and should be followed up as soon as possible or go to the Emergency Department if any problems should occur.  Please show the CHEMOTHERAPY ALERT CARD or IMMUNOTHERAPY ALERT  CARD at check-in to the Emergency Department and triage nurse.  Should you have questions after your visit or need to cancel or reschedule your appointment, please contact [facility]  Dept: [phone]  and follow the prompts.  Office hours are 8:00 a.m. to 4:30 p.m. Monday - Friday. Please note that voicemails left after 4:00 p.m. may not be returned until the following business day.  We are closed weekends and major holidays. You have access to a nurse at all times for urgent questions. Please call the main number to the clinic Dept: [phone] and follow the prompts.   For any non-urgent questions, you may also contact your provider using MyChart. We now offer e-Visits for anyone 18 and older to request care online for non-urgent symptoms. For details visit [email or web address].   Also download the MyChart app! Go to the app store, search "MyChart", open the app, select [facility], and log in with your MyChart username and password.  Masks are optional in the cancer centers. If you would like for your care team to wear a mask while they are taking care of you, please let them know. You may have one support person who is at least 66 years old accompany you for your appointments. 

## 2022-09-02 ENCOUNTER — Telehealth: Payer: Self-pay | Admitting: Adult Health

## 2022-09-02 NOTE — Telephone Encounter (Signed)
Scheduled appointment per 10/5 los. Unable to leave a voicemail due to mailbox being full. Patient will be mailed an updated calendar.

## 2022-09-03 ENCOUNTER — Other Ambulatory Visit: Payer: Self-pay

## 2022-09-06 ENCOUNTER — Encounter: Payer: Self-pay | Admitting: *Deleted

## 2022-09-12 DIAGNOSIS — I10 Essential (primary) hypertension: Secondary | ICD-10-CM | POA: Diagnosis not present

## 2022-09-12 DIAGNOSIS — E559 Vitamin D deficiency, unspecified: Secondary | ICD-10-CM | POA: Diagnosis not present

## 2022-09-12 DIAGNOSIS — Z720 Tobacco use: Secondary | ICD-10-CM | POA: Diagnosis not present

## 2022-09-12 DIAGNOSIS — F419 Anxiety disorder, unspecified: Secondary | ICD-10-CM | POA: Diagnosis not present

## 2022-09-12 DIAGNOSIS — Z0001 Encounter for general adult medical examination with abnormal findings: Secondary | ICD-10-CM | POA: Diagnosis not present

## 2022-09-12 DIAGNOSIS — E782 Mixed hyperlipidemia: Secondary | ICD-10-CM | POA: Diagnosis not present

## 2022-09-16 ENCOUNTER — Inpatient Hospital Stay: Payer: Medicare HMO

## 2022-09-22 ENCOUNTER — Encounter: Payer: Self-pay | Admitting: *Deleted

## 2022-09-22 ENCOUNTER — Inpatient Hospital Stay: Payer: Medicare HMO

## 2022-09-22 ENCOUNTER — Other Ambulatory Visit: Payer: Self-pay

## 2022-09-22 VITALS — BP 151/78 | HR 75 | Temp 98.5°F | Resp 18 | Wt 118.0 lb

## 2022-09-22 DIAGNOSIS — Z171 Estrogen receptor negative status [ER-]: Secondary | ICD-10-CM | POA: Diagnosis not present

## 2022-09-22 DIAGNOSIS — C773 Secondary and unspecified malignant neoplasm of axilla and upper limb lymph nodes: Secondary | ICD-10-CM | POA: Diagnosis not present

## 2022-09-22 DIAGNOSIS — C50411 Malignant neoplasm of upper-outer quadrant of right female breast: Secondary | ICD-10-CM | POA: Diagnosis not present

## 2022-09-22 DIAGNOSIS — Z5112 Encounter for antineoplastic immunotherapy: Secondary | ICD-10-CM | POA: Diagnosis not present

## 2022-09-22 MED ORDER — SODIUM CHLORIDE 0.9 % IV SOLN: Freq: Once | INTRAVENOUS | Status: AC

## 2022-09-22 MED ORDER — HEPARIN SOD (PORK) LOCK FLUSH 100 UNIT/ML IV SOLN
500.0000 [IU] | Freq: Once | INTRAVENOUS | Status: AC | PRN
  Administered 2022-09-22: 500 [IU]

## 2022-09-22 MED ORDER — SODIUM CHLORIDE 0.9 % IV SOLN
420.0000 mg | Freq: Once | INTRAVENOUS | Status: AC
  Administered 2022-09-22: 420 mg via INTRAVENOUS
  Filled 2022-09-22: qty 14

## 2022-09-22 MED ORDER — ACETAMINOPHEN 325 MG PO TABS
650.0000 mg | ORAL_TABLET | Freq: Once | ORAL | Status: AC
  Administered 2022-09-22: 650 mg via ORAL
  Filled 2022-09-22: qty 2

## 2022-09-22 MED ORDER — SODIUM CHLORIDE 0.9% FLUSH
10.0000 mL | INTRAVENOUS | Status: DC | PRN
  Administered 2022-09-22: 10 mL

## 2022-09-22 MED ORDER — DIPHENHYDRAMINE HCL 25 MG PO CAPS
50.0000 mg | ORAL_CAPSULE | Freq: Once | ORAL | Status: AC
  Administered 2022-09-22: 50 mg via ORAL
  Filled 2022-09-22: qty 2

## 2022-09-22 MED ORDER — TRASTUZUMAB-ANNS CHEMO 150 MG IV SOLR
6.0000 mg/kg | Freq: Once | INTRAVENOUS | Status: AC
  Administered 2022-09-22: 315 mg via INTRAVENOUS
  Filled 2022-09-22: qty 15

## 2022-09-22 NOTE — Progress Notes (Signed)
Per 08/10/22 provider encounter note, OK to proceed with ECHO 07/11/22 left EF 47%. Pt seeing cardiology to monitor heart function.

## 2022-09-22 NOTE — Patient Instructions (Signed)
[facility] ONCOLOG[facility]  Discharge Instructions: Thank you for choosing [facility] to provide your oncology and hematology care.   If you have a lab appointment with the [facility], please go directly to the [facility] and check in at the registration area.   Wear comfortable clothing and clothing appropriate for easy access to any Portacath or PICC line.   We strive to give you quality time with your provider. You may need to reschedule your appointment if you arrive late (15 or more minutes).  Arriving late affects you and other patients whose appointments are after yours.  Also, if you miss three or more appointments without notifying the office, you may be dismissed from the clinic at the provider's discretion.      For prescription refill requests, have your pharmacy contact our office and allow 72 hours for refills to be completed.    Today you received the following chemotherapy and/or immunotherapy agents: Kanjinti/Perjeta      To help prevent nausea and vomiting after your treatment, we encourage you to take your nausea medication as directed.  BELOW ARE SYMPTOMS THAT SHOULD BE REPORTED IMMEDIATELY: *FEVER GREATER THAN 100.4 F (38 C) OR HIGHER *CHILLS OR SWEATING *NAUSEA AND VOMITING THAT IS NOT CONTROLLED WITH YOUR NAUSEA MEDICATION *UNUSUAL SHORTNESS OF BREATH *UNUSUAL BRUISING OR BLEEDING *URINARY PROBLEMS (pain or burning when urinating, or frequent urination) *BOWEL PROBLEMS (unusual diarrhea, constipation, pain near the anus) TENDERNESS IN MOUTH AND THROAT WITH OR WITHOUT PRESENCE OF ULCERS (sore throat, sores in mouth, or a toothache) UNUSUAL RASH, SWELLING OR PAIN  UNUSUAL VAGINAL DISCHARGE OR ITCHING   Items with * indicate a potential emergency and should be followed up as soon as possible or go to the Emergency Department if any problems should occur.  Please show the CHEMOTHERAPY ALERT CARD or IMMUNOTHERAPY ALERT CARD at  check-in to the Emergency Department and triage nurse.  Should you have questions after your visit or need to cancel or reschedule your appointment, please contact [facility]  Dept: [phone]  and follow the prompts.  Office hours are 8:00 a.m. to 4:30 p.m. Monday - Friday. Please note that voicemails left after 4:00 p.m. may not be returned until the following business day.  We are closed weekends and major holidays. You have access to a nurse at all times for urgent questions. Please call the main number to the clinic Dept: [phone] and follow the prompts.   For any non-urgent questions, you may also contact your provider using MyChart. We now offer e-Visits for anyone 35 and older to request care online for non-urgent symptoms. For details visit [email or web address].   Also download the MyChart app! Go to the app store, search "MyChart", open the app, select [facility], and log in with your MyChart username and password.  Masks are optional in the cancer centers. If you would like for your care team to wear a mask while they are taking care of you, please let them know. You may have one support person who is at least 66 years old accompany you for your appointments.

## 2022-09-23 ENCOUNTER — Inpatient Hospital Stay: Payer: Medicare HMO

## 2022-09-25 NOTE — Progress Notes (Signed)
Radiation Oncology         [phone] ________________________________  Name: Lynn Mcgee MRN: [id number]  Date: 09/26/2022  DOB: 06/08/1956  Follow-Up Visit Note  CC: Osei-Bonsu, George, MD  Gudena, Vinay, MD  No diagnosis found.  Diagnosis: The encounter diagnosis was Malignant neoplasm of upper-outer quadrant of right breast in female, estrogen receptor negative (HCC).   Stage IIIB (cT4d, cN1, cM0) Right Breast UOQ, Invasive Lobular Carcinoma, ER- / PR- / Her2+, Grade 3; status post chemotherapy and bilateral mastectomies with no evidence of residual carcinoma   Interval Since Last Radiation: 4 months and 9 days   Intent: Curative  Radiation Treatment Dates: 04/06/2022 through 05/18/2022 Site Technique Total Dose (Gy) Dose per Fx (Gy) Completed Fx Beam Energies  Chest Wall, Right: CW_R 3D 50/50 2 25/25 6XFFF  Chest Wall, Right: CW_R_Bst Electron 10/10 2 5/5 6E  Sclav-RT: SCV_R 3D 50/50 2 25/25 6X, 10X     Narrative:  The patient returns today for routine follow-up, she was last seen here for follow-up on 06/23/22. Since her last visit, the patient followed up with medical oncology on 09/01/22 to review her survivorship care plan. The patient was noted to be feeling well at that time and denied any significant issues.  She most recently received cycle 11 of maintenance Herceptin/Perjeta on 09/22/22. She is tolerating maintenance treatment well without any significant side effects.          The patient continues to be followed by cardiology for her AAA. Coronary morph CT on 08/09/22 showed stability of the 4.2 cm ascending thoracic aortic aneurysm. CT also showed stable emphysematous changes and pulmonary scarring in the chest.                 ***  Allergies:  has No Known Allergies.  Meds: Current Outpatient Medications  Medication Sig Dispense Refill   acetaminophen (TYLENOL) 500 MG tablet Take 1,000 mg by mouth every 6 (six) hours as needed for mild pain.     amLODipine  (NORVASC) 5 MG tablet Take 1 tablet (5 mg total) by mouth daily. 90 tablet 3   atorvastatin (LIPITOR) 40 MG tablet Take 40 mg by mouth daily.     carvedilol (COREG) 6.25 MG tablet Take 1 tablet (6.25 mg total) by mouth 2 (two) times daily. 60 tablet 3   hydroquinone 4 % cream Apply topically 2 (two) times daily.     hydrOXYzine (ATARAX) 25 MG tablet Take 25 mg by mouth every 8 (eight) hours as needed for anxiety.     losartan (COZAAR) 50 MG tablet Take 50 mg by mouth daily.     potassium chloride (KLOR-CON) 20 MEQ packet Take 20 mEq by mouth daily. 30 packet 1   spironolactone (ALDACTONE) 25 MG tablet Take 1 tablet (25 mg total) by mouth daily. 30 tablet 6   No current facility-administered medications for this encounter.    Physical Findings: The patient is in no acute distress. Patient is alert and oriented.  vitals were not taken for this visit. .  No significant changes. Lungs are clear to auscultation bilaterally. Heart has regular rate and rhythm. No palpable cervical, supraclavicular, or axillary adenopathy. Abdomen soft, non-tender, normal bowel sounds.  Left Breast: no palpable mass, nipple discharge or bleeding. Right Breast: ***  Lab Findings: Lab Results  Component Value Date   WBC 3.9 (L) 09/01/2022   HGB 11.3 (L) 09/01/2022   HCT 35.0 (L) 09/01/2022   MCV 77.1 (L) 09/01/2022   PLT 288   09/01/2022    Radiographic Findings: No results found.  Impression:  The encounter diagnosis was Malignant neoplasm of upper-outer quadrant of right breast in female, estrogen receptor negative ([facility]).   Stage IIIB (cT4d, cN1, cM0) Right Breast UOQ, Invasive Lobular Carcinoma, ER- / PR- / Her2+, Grade 3; status post chemotherapy and bilateral mastectomies with no evidence of residual carcinoma   The patient is recovering from the effects of radiation.  ***  Plan:  ***   *** minutes of total time was spent for this patient encounter, including preparation, face-to-face counseling  with the patient and coordination of care, physical exam, and documentation of the encounter. ____________________________________  Blair Promise, PhD, MD  This document serves as a record of services personally performed by Gery Pray, MD. It was created on his behalf by Roney Mans, a trained medical scribe. The creation of this record is based on the scribe's personal observations and the provider's statements to them. This document has been checked and approved by the attending provider.

## 2022-09-26 ENCOUNTER — Other Ambulatory Visit: Payer: Self-pay

## 2022-09-26 ENCOUNTER — Ambulatory Visit
Admission: RE | Admit: 2022-09-26 | Discharge: 2022-09-26 | Disposition: A | Discharge status: Home / Self Care | Payer: Medicare HMO | Source: Ambulatory Visit | Attending: Radiation Oncology | Admitting: Radiation Oncology

## 2022-09-26 ENCOUNTER — Encounter: Payer: Self-pay | Admitting: Radiation Oncology

## 2022-09-26 VITALS — BP 187/96 | HR 75 | Temp 97.8°F | Resp 20 | Ht 65.0 in | Wt 115.8 lb

## 2022-09-26 DIAGNOSIS — Z79899 Other long term (current) drug therapy: Secondary | ICD-10-CM | POA: Diagnosis not present

## 2022-09-26 DIAGNOSIS — C50411 Malignant neoplasm of upper-outer quadrant of right female breast: Secondary | ICD-10-CM | POA: Insufficient documentation

## 2022-09-26 DIAGNOSIS — I7121 Aneurysm of the ascending aorta, without rupture: Secondary | ICD-10-CM | POA: Diagnosis not present

## 2022-09-26 DIAGNOSIS — Z923 Personal history of irradiation: Secondary | ICD-10-CM | POA: Diagnosis not present

## 2022-09-26 DIAGNOSIS — Z171 Estrogen receptor negative status [ER-]: Secondary | ICD-10-CM

## 2022-09-26 NOTE — Progress Notes (Signed)
Malyiah Fellows is here today for follow up post radiation to the breast.   Breast Side: Right chest wall   They completed their radiation on: 05/18/22  Does the patient complain of any of the following: Post radiation skin issues:  Continues to be hyperpigmented Breast Tenderness: Yes Breast Swelling: No Lymphadema: No Range of Motion limitations: No Fatigue post radiation: Yes, patient states energy level is improving.  Appetite good/fair/poor: good  Additional comments if applicable:  Noted patient to have elevated blood pressure. Patient states she has not yet taken her BP medications. Patient to take amlodipine, coreg, and losartan once she gets home. Denies any issues related to elevated BP.   BP (!) 187/96 (BP Location: Left Arm, Patient Position: Sitting, Cuff Size: Normal)   Pulse 75   Temp 97.8 F (36.6 C)   Resp 20   Ht '5\' 5"'$  (1.651 m)   Wt 115 lb 12.8 oz (52.5 kg)   LMP  (LMP Unknown)   SpO2 98%   BMI 19.27 kg/m

## 2022-10-13 ENCOUNTER — Other Ambulatory Visit: Payer: Self-pay

## 2022-10-13 ENCOUNTER — Other Ambulatory Visit (HOSPITAL_COMMUNITY): Payer: Self-pay

## 2022-10-13 ENCOUNTER — Ambulatory Visit (HOSPITAL_COMMUNITY)
Admission: RE | Admit: 2022-10-13 | Discharge: 2022-10-13 | Disposition: A | Discharge status: Home / Self Care | Payer: Medicare HMO | Source: Ambulatory Visit | Attending: Internal Medicine | Admitting: Internal Medicine

## 2022-10-13 ENCOUNTER — Encounter: Payer: Self-pay | Admitting: Hematology and Oncology

## 2022-10-13 ENCOUNTER — Encounter (HOSPITAL_COMMUNITY): Payer: Self-pay | Admitting: Internal Medicine

## 2022-10-13 ENCOUNTER — Ambulatory Visit (HOSPITAL_BASED_OUTPATIENT_CLINIC_OR_DEPARTMENT_OTHER)
Admission: RE | Admit: 2022-10-13 | Discharge: 2022-10-13 | Disposition: A | Discharge status: Home / Self Care | Payer: Medicare HMO | Source: Ambulatory Visit | Attending: Internal Medicine | Admitting: Internal Medicine

## 2022-10-13 VITALS — BP 140/80 | HR 64 | Wt 116.2 lb

## 2022-10-13 DIAGNOSIS — C50411 Malignant neoplasm of upper-outer quadrant of right female breast: Secondary | ICD-10-CM

## 2022-10-13 DIAGNOSIS — I2510 Atherosclerotic heart disease of native coronary artery without angina pectoris: Secondary | ICD-10-CM

## 2022-10-13 DIAGNOSIS — I351 Nonrheumatic aortic (valve) insufficiency: Secondary | ICD-10-CM | POA: Insufficient documentation

## 2022-10-13 DIAGNOSIS — Z01818 Encounter for other preprocedural examination: Secondary | ICD-10-CM | POA: Diagnosis not present

## 2022-10-13 DIAGNOSIS — I10 Essential (primary) hypertension: Secondary | ICD-10-CM | POA: Diagnosis not present

## 2022-10-13 DIAGNOSIS — Z0189 Encounter for other specified special examinations: Secondary | ICD-10-CM | POA: Diagnosis not present

## 2022-10-13 DIAGNOSIS — Z720 Tobacco use: Secondary | ICD-10-CM

## 2022-10-13 DIAGNOSIS — I5022 Chronic systolic (congestive) heart failure: Secondary | ICD-10-CM | POA: Diagnosis not present

## 2022-10-13 DIAGNOSIS — R011 Cardiac murmur, unspecified: Secondary | ICD-10-CM | POA: Insufficient documentation

## 2022-10-13 DIAGNOSIS — Z171 Estrogen receptor negative status [ER-]: Secondary | ICD-10-CM

## 2022-10-13 LAB — ECHOCARDIOGRAM COMPLETE
AR max vel: 1.84 cm2
AV Peak grad: 10.9 mmHg
Ao pk vel: 1.65 m/s
Area-P 1/2: 2.25 cm2
P 1/2 time: 712 msec
S' Lateral: 4 cm

## 2022-10-13 MED ORDER — ENTRESTO 49-51 MG PO TABS: 1.0000 | ORAL_TABLET | Freq: Two times a day (BID) | ORAL | 11 refills | Status: DC

## 2022-10-13 MED ORDER — ASPIRIN 81 MG PO TBEC: 81.0000 mg | DELAYED_RELEASE_TABLET | Freq: Every day | ORAL | 3 refills | Status: AC

## 2022-10-13 NOTE — H&P (View-Only) (Signed)
ADVANCED HF CLINIC  NOTE  Referring Physician: Dr Lindi Adie  Primary Care:Dr Osei Bonsu Primary Cardiologist: none  Oncology: Dr Lindi Adie.   HPI: Lynn Mcgee is a 66 year old with history of HTN, smoker, breast cancer, chronic systolic HF  Diagnosed with HF in 10/21 at [facility]. - Echo 10/21 ([place]) EF 40-45% - Myoview 10/21 EF 36% ? anterior wall defect -> treated medically   Subsequently found to have breast CA. Followed by Dr Lindi Adie - Stage IIIB (cT4d, cN1, cM0, G3, ER-, PR-, HER2+).   Pre-chemo echo 10/22: EF 45-50% , Grade I DD RV normal   Started Docetaxel + Carboplatin + Trastuzumab + Pertuzumab  (TCHP) q21d on 09/23/21.   Scans on 09/28/21 no sign of metastatic disease.   Echo 11/09/21; EF 45-50% Global [place]. Lynn Mcgee was increased to 25 mg daily   Echo 07/11/22 EF 45-50%   Coronary CT 9/23 with 3v CAD.  1. LAD: findings 0.84, 0.80 0.71 very distal vessel D1: 0.91, 0.79 D2: 0.81 2. LCX: findings 0.77 3.  RCA: findings 0.68, 0.58 0.50    PDA: 0.53  Here for f/u. Has finished Herceptin/Perjeta. Denies CP but does have exertional dyspnea. Has quit smoking for past 2 days. No edema, orthopnea or PND  Echo today 10/13/22: EF 40-45% Personally reviewed    Past Medical History:  Diagnosis Date   Cancer [facility])    right breast cancer   CHF (congestive heart failure) (HCC)    per patient   Heart murmur    pt states MD stated she had heart murmur "years ago"   History of radiation therapy    Right chest wall 04/06/22-05/18/22- Dr. Gery Pray   Hypertension     Current Outpatient Medications  Medication Sig Dispense Refill   acetaminophen (TYLENOL) 500 MG tablet Take 1,000 mg by mouth every 6 (six) hours as needed for mild pain.     amLODipine (NORVASC) 5 MG tablet Take 1 tablet (5 mg total) by mouth daily. 90 tablet 3   atorvastatin (LIPITOR) 40 MG tablet Take 40 mg by mouth daily.     carvedilol (COREG) 6.25 MG tablet Take 1 tablet (6.25 mg total) by mouth 2 (two)  times daily. 60 tablet 3   hydroquinone 4 % cream Apply topically 2 (two) times daily.     hydrOXYzine (ATARAX) 25 MG tablet Take 25 mg by mouth every 8 (eight) hours as needed for anxiety.     losartan (COZAAR) 50 MG tablet Take 50 mg by mouth daily.     potassium chloride (KLOR-CON) 20 MEQ packet Take 20 mEq by mouth daily. 30 packet 1   spironolactone (ALDACTONE) 25 MG tablet Take 1 tablet (25 mg total) by mouth daily. 30 tablet 6   No current facility-administered medications for this encounter.    No Known Allergies    Social History   Socioeconomic History   Marital status: Single    Spouse name: Not on file   Number of children: Not on file   Years of education: Not on file   Highest education level: Not on file  Occupational History   Not on file  Tobacco Use   Smoking status: Every Day    Packs/day: 0.50    Years: 35.00    Total pack years: 17.50    Types: Cigarettes   Smokeless tobacco: Never  Vaping Use   Vaping Use: Never used  Substance and Sexual Activity   Alcohol use: Not Currently    Comment: "  way back"   Drug use: Yes    Types: Marijuana    Comment: "occasionally" every couple of months   Sexual activity: Not on file  Other Topics Concern   Not on file  Social History Narrative   Not on file   Social Determinants of Health   Financial Resource Strain: Not on file  Food Insecurity: Not on file  Transportation Needs: Not on file  Physical Activity: Not on file  Stress: Not on file  Social Connections: Not on file  Intimate Partner Violence: Not on file     Vitals:   10/13/22 1501  BP: (!) 140/80  Pulse: 64  SpO2: 97%  Weight: 52.7 kg (116 lb 3.2 oz)     PHYSICAL EXAM: General:  Well appearing. Thin No resp difficulty HEENT: normal Neck: supple. no JVD. Carotids 2+ bilat; no bruits. No lymphadenopathy or thryomegaly appreciated. Cor: PMI nondisplaced. Regular rate & rhythm. 2/6 SEM RUSB Lungs: clear Abdomen: soft, nontender,  nondistended. No hepatosplenomegaly. No bruits or masses. Good bowel sounds. Extremities: no cyanosis, clubbing, rash, edema Neuro: alert & orientedx3, cranial nerves grossly intact. moves all 4 extremities w/o difficulty. Affect pleasant    ASSESSMENT & PLAN:  1. Chronic systolic HF - Echo 76/22 ([place]) EF 40-45% - Myoview 10/21 EF 36% ? anterior wall defect -> treated medically - CT chest  10/22 + 3v coronary calcium - Echo 10/22 (pre-chemo) EF 45-50% No RWMA  - Echo 11/09/21 EF 50-55%  - Echo 5/23 EF 45-50% - Echo  07/11/22 EF 45-50%  - Echo 10/13/22: EF 40-45% - Stable NYHA I-II Volume status stable. No diuretics - Switch losartan to Entresto 49/51 bid - Continue spiro 25 mg daily - Continue carvedilol at 6.25 mg  bid - Start SGLT2i at next visit - LV dysfunction predated chemotherapy. 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 Discussed role of cath with her and her daughter. Would like to proceed. Will arrange for cath with Dr. Burt Knack  2. Breast Cancer  - Stage IIIB (cT4d, cN1, cM0, G3, ER-, PR-, HER2+)  - Had Echo 09/22/21 EF 45-50% -->Pre chemo - Started on  Docetaxel + Carboplatin + Trastuzumab + Pertuzumab  (TCHP) q21d  on 09/23/21 - Echo 11/09/21 EF 50-55%  - Echo 5/23 EF 45-50% - Echo  07/11/22 EF 45-50%  - Finished Herceptin/perjeta - Echo 10/13/22: EF 40-45%  3. CAD - cardiac CT with 3v cad - discussed cath -> will arrange - continue statin  - start ASA  4. HTN  -Meds as above  5. 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

## 2022-10-13 NOTE — Progress Notes (Signed)
  Echocardiogram 2D Echocardiogram has been performed.  Darlina Sicilian M 10/13/2022, 2:53 PM

## 2022-10-13 NOTE — Progress Notes (Signed)
ADVANCED HF CLINIC  NOTE  Referring Physician: Dr Lindi Adie  Primary Care:Dr Osei Bonsu Primary Cardiologist: none  Oncology: Dr Lindi Adie.   HPI: Lynn Mcgee is a 66 year old with history of HTN, smoker, breast cancer, chronic systolic HF  Diagnosed with HF in 10/21 at [facility]. - Echo 10/21 ([place]) EF 40-45% - Myoview 10/21 EF 36% ? anterior wall defect -> treated medically   Subsequently found to have breast CA. Followed by Dr Lindi Adie - Stage IIIB (cT4d, cN1, cM0, G3, ER-, PR-, HER2+).   Pre-chemo echo 10/22: EF 45-50% , Grade I DD RV normal   Started Docetaxel + Carboplatin + Trastuzumab + Pertuzumab  (TCHP) q21d on 09/23/21.   Scans on 09/28/21 no sign of metastatic disease.   Echo 11/09/21; EF 45-50% Global [place]. [name] was increased to 25 mg daily   Echo 07/11/22 EF 45-50%   Coronary CT 9/23 with 3v CAD.  1. LAD: findings 0.84, 0.80 0.71 very distal vessel D1: 0.91, 0.79 D2: 0.81 2. LCX: findings 0.77 3.  RCA: findings 0.68, 0.58 0.50    PDA: 0.53  Here for f/u. Has finished Herceptin/Perjeta. Denies CP but does have exertional dyspnea. Has quit smoking for past 2 days. No edema, orthopnea or PND  Echo today 10/13/22: EF 40-45% Personally reviewed    Past Medical History:  Diagnosis Date   Cancer [facility])    right breast cancer   CHF (congestive heart failure) (HCC)    per patient   Heart murmur    pt states MD stated she had heart murmur "years ago"   History of radiation therapy    Right chest wall 04/06/22-05/18/22- Dr. Gery Pray   Hypertension     Current Outpatient Medications  Medication Sig Dispense Refill   acetaminophen (TYLENOL) 500 MG tablet Take 1,000 mg by mouth every 6 (six) hours as needed for mild pain.     amLODipine (NORVASC) 5 MG tablet Take 1 tablet (5 mg total) by mouth daily. 90 tablet 3   atorvastatin (LIPITOR) 40 MG tablet Take 40 mg by mouth daily.     carvedilol (COREG) 6.25 MG tablet Take 1 tablet (6.25 mg total) by mouth 2 (two)  times daily. 60 tablet 3   hydroquinone 4 % cream Apply topically 2 (two) times daily.     hydrOXYzine (ATARAX) 25 MG tablet Take 25 mg by mouth every 8 (eight) hours as needed for anxiety.     losartan (COZAAR) 50 MG tablet Take 50 mg by mouth daily.     potassium chloride (KLOR-CON) 20 MEQ packet Take 20 mEq by mouth daily. 30 packet 1   spironolactone (ALDACTONE) 25 MG tablet Take 1 tablet (25 mg total) by mouth daily. 30 tablet 6   No current facility-administered medications for this encounter.    No Known Allergies    Social History   Socioeconomic History   Marital status: Single    Spouse name: Not on file   Number of children: Not on file   Years of education: Not on file   Highest education level: Not on file  Occupational History   Not on file  Tobacco Use   Smoking status: Every Day    Packs/day: 0.50    Years: 35.00    Total pack years: 17.50    Types: Cigarettes   Smokeless tobacco: Never  Vaping Use   Vaping Use: Never used  Substance and Sexual Activity   Alcohol use: Not Currently    Comment: "  way back"   Drug use: Yes    Types: Marijuana    Comment: "occasionally" every couple of months   Sexual activity: Not on file  Other Topics Concern   Not on file  Social History Narrative   Not on file   Social Determinants of Health   Financial Resource Strain: Not on file  Food Insecurity: Not on file  Transportation Needs: Not on file  Physical Activity: Not on file  Stress: Not on file  Social Connections: Not on file  Intimate Partner Violence: Not on file     Vitals:   10/13/22 1501  BP: (!) 140/80  Pulse: 64  SpO2: 97%  Weight: 52.7 kg (116 lb 3.2 oz)     PHYSICAL EXAM: General:  Well appearing. Thin No resp difficulty HEENT: normal Neck: supple. no JVD. Carotids 2+ bilat; no bruits. No lymphadenopathy or thryomegaly appreciated. Cor: PMI nondisplaced. Regular rate & rhythm. 2/6 SEM RUSB Lungs: clear Abdomen: soft, nontender,  nondistended. No hepatosplenomegaly. No bruits or masses. Good bowel sounds. Extremities: no cyanosis, clubbing, rash, edema Neuro: alert & orientedx3, cranial nerves grossly intact. moves all 4 extremities w/o difficulty. Affect pleasant    ASSESSMENT & PLAN:  1. Chronic systolic HF - Echo 73/40 ([place]) EF 40-45% - Myoview 10/21 EF 36% ? anterior wall defect -> treated medically - CT chest  10/22 + 3v coronary calcium - Echo 10/22 (pre-chemo) EF 45-50% No RWMA  - Echo 11/09/21 EF 50-55%  - Echo 5/23 EF 45-50% - Echo  07/11/22 EF 45-50%  - Echo 10/13/22: EF 40-45% - Stable NYHA I-II Volume status stable. No diuretics - Switch losartan to Entresto 49/51 bid - Continue spiro 25 mg daily - Continue carvedilol at 6.25 mg  bid - Start SGLT2i at next visit - LV dysfunction predated chemotherapy. 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 Discussed role of cath with her and her daughter. Would like to proceed. Will arrange for cath with Dr. Burt Knack  2. Breast Cancer  - Stage IIIB (cT4d, cN1, cM0, G3, ER-, PR-, HER2+)  - Had Echo 09/22/21 EF 45-50% -->Pre chemo - Started on  Docetaxel + Carboplatin + Trastuzumab + Pertuzumab  (TCHP) q21d  on 09/23/21 - Echo 11/09/21 EF 50-55%  - Echo 5/23 EF 45-50% - Echo  07/11/22 EF 45-50%  - Finished Herceptin/perjeta - Echo 10/13/22: EF 40-45%  3. CAD - cardiac CT with 3v cad - discussed cath -> will arrange - continue statin  - start ASA  4. HTN  -Meds as above  5. 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

## 2022-10-13 NOTE — Patient Instructions (Signed)
STOP Losartan  START Entresto 49/51 mg Twice daily  START Asprin 81 mg daily.    You are scheduled for a Cardiac Catheterization on Wednesday, November 29 with Dr. Sherren Mocha.  1. Please arrive at the [facility] (Main Entrance A) at [facility]: [address] [place],  [address] at 8:00 AM (This time is two hours before your procedure to ensure your preparation). Free valet parking service is available.   Special note: Every effort is made to have your procedure done on time. Please understand that emergencies sometimes delay scheduled procedures.  2. Diet: Do not eat solid foods after midnight.  The patient may have clear liquids until 5am upon the day of the procedure.  3. Medication instructions in preparation for your procedure:   Contrast Allergy: No  On the morning of your procedure, take your Aspirin 81 mg and any morning medicines NOT listed above.  You may use sips of water.  5. Plan for one night stay--bring personal belongings. 6. Bring a current list of your medications and current insurance cards. 7. You MUST have a responsible person to drive you home. 8. Someone MUST be with you the first 24 hours after you arrive home or your discharge will be delayed. 9. Please wear clothes that are easy to get on and off and wear slip-on shoes.  Your physician recommends that you schedule a follow-up appointment in: 3 months (February 2024 ) ** please call the office in December to arrange your follow up appointment **  If you have any questions or concerns before your next appointment please send [place] a message through [employer] or call our office at [phone].    TO LEAVE A MESSAGE FOR THE NURSE SELECT OPTION 2, PLEASE LEAVE A MESSAGE INCLUDING: YOUR NAME DATE OF BIRTH CALL BACK NUMBER REASON FOR CALL**this is important as we prioritize the call backs  YOU WILL RECEIVE A CALL BACK THE SAME DAY AS LONG AS YOU CALL BEFORE 4:00 PM  At the [facility], you and your health needs are our priority. As part of our continuing mission to provide you with exceptional heart care, we have created designated Provider Care Teams. These Care Teams include your primary Cardiologist (physician) and Advanced Practice Providers (APPs- Physician Assistants and Nurse Practitioners) who all work together to provide you with the care you need, when you need it.   You may see any of the following providers on your designated Care Team at your next follow up: Dr Glori Bickers Dr Loralie Champagne Dr. Roxana Hires, NP Lyda Jester, [place] [facility] [place], [place] Forestine Na, NP Audry Riles, PharmD   Please be sure to bring in all your medications bottles to every appointment.

## 2022-10-24 ENCOUNTER — Telehealth (HOSPITAL_COMMUNITY): Payer: Self-pay | Admitting: *Deleted

## 2022-10-25 ENCOUNTER — Telehealth: Payer: Self-pay | Admitting: *Deleted

## 2022-10-25 NOTE — Telephone Encounter (Signed)
Cardiac Catheterization scheduled at [facility] for: Wednesday October 26, 2022 10 AM Arrival time and place: [facility] Main Entrance A at: 8 AM   Nothing to eat after midnight prior to procedure, clear liquids until 5 AM day of procedure.  Medication instructions: -Hold:  Spironolactone/KCl-AM of procedure -Except hold medications usual morning medications can be taken with sips of water including aspirin 81 mg.  Confirmed patient has responsible adult to drive home post procedure and be with patient first 24 hours after arriving home.  Patient reports no new symptoms concerning for COVID-19 in the past 10 days.  Call placed to patient to review procedure instructions, no answer, unable to leave message to call back.

## 2022-10-25 NOTE — Telephone Encounter (Signed)
No answer, unable to leave message °

## 2022-10-25 NOTE — Telephone Encounter (Signed)
Reviewed procedure instructions with patient.  

## 2022-10-26 ENCOUNTER — Ambulatory Visit (HOSPITAL_COMMUNITY)
Admission: RE | Admit: 2022-10-26 | Discharge: 2022-10-26 | Disposition: A | Discharge status: Home / Self Care | Payer: Medicare HMO | Attending: Cardiovascular Disease | Admitting: Cardiovascular Disease

## 2022-10-26 ENCOUNTER — Ambulatory Visit (HOSPITAL_COMMUNITY)
Admission: RE | Disposition: A | Discharge status: Home / Self Care | Payer: Self-pay | Source: Home / Self Care | Attending: Cardiovascular Disease

## 2022-10-26 ENCOUNTER — Other Ambulatory Visit: Payer: Self-pay

## 2022-10-26 DIAGNOSIS — R931 Abnormal findings on diagnostic imaging of heart and coronary circulation: Secondary | ICD-10-CM

## 2022-10-26 DIAGNOSIS — C50919 Malignant neoplasm of unspecified site of unspecified female breast: Secondary | ICD-10-CM | POA: Insufficient documentation

## 2022-10-26 DIAGNOSIS — F17210 Nicotine dependence, cigarettes, uncomplicated: Secondary | ICD-10-CM | POA: Diagnosis not present

## 2022-10-26 DIAGNOSIS — Z79899 Other long term (current) drug therapy: Secondary | ICD-10-CM | POA: Insufficient documentation

## 2022-10-26 DIAGNOSIS — Z01812 Encounter for preprocedural laboratory examination: Secondary | ICD-10-CM

## 2022-10-26 DIAGNOSIS — I5022 Chronic systolic (congestive) heart failure: Secondary | ICD-10-CM | POA: Diagnosis not present

## 2022-10-26 DIAGNOSIS — I110 Hypertensive heart disease with heart failure: Secondary | ICD-10-CM | POA: Diagnosis not present

## 2022-10-26 DIAGNOSIS — I2510 Atherosclerotic heart disease of native coronary artery without angina pectoris: Secondary | ICD-10-CM | POA: Insufficient documentation

## 2022-10-26 DIAGNOSIS — Q2543 Congenital aneurysm of aorta: Secondary | ICD-10-CM | POA: Diagnosis not present

## 2022-10-26 HISTORY — PX: LEFT HEART CATH AND CORONARY ANGIOGRAPHY: CATH118249

## 2022-10-26 LAB — BASIC METABOLIC PANEL
Anion gap: 8 (ref 5–15)
BUN: 13 mg/dL (ref 8–23)
CO2: 23 mmol/L (ref 22–32)
Calcium: 9.1 mg/dL (ref 8.9–10.3)
Chloride: 109 mmol/L (ref 98–111)
Creatinine, Ser: 0.75 mg/dL (ref 0.44–1.00)
GFR, Estimated: >60 mL/min (ref >60)
Glucose, Bld: 92 mg/dL (ref 70–99)
Potassium: 4.1 mmol/L (ref 3.5–5.1)
Sodium: 140 mmol/L (ref 135–145)

## 2022-10-26 LAB — CBC
HCT: 40.1 % (ref 36.0–46.0)
Hemoglobin: 12.8 g/dL (ref 12.0–15.0)
MCH: 24.6 pg — ABNORMAL LOW (ref 26.0–34.0)
MCHC: 31.9 g/dL (ref 30.0–36.0)
MCV: 77.1 fL — ABNORMAL LOW (ref 80.0–100.0)
Platelets: 342 10*3/uL (ref 150–400)
RBC: 5.2 MIL/uL — ABNORMAL HIGH (ref 3.87–5.11)
RDW: 15.2 % (ref 11.5–15.5)
WBC: 4.5 10*3/uL (ref 4.0–10.5)
nRBC: 0 % (ref 0.0–0.2)

## 2022-10-26 SURGERY — LEFT HEART CATH AND CORONARY ANGIOGRAPHY
Anesthesia: LOCAL

## 2022-10-26 MED ORDER — SODIUM CHLORIDE 0.9% FLUSH: 3.0000 mL | Freq: Two times a day (BID) | INTRAVENOUS | Status: DC

## 2022-10-26 MED ORDER — LABETALOL HCL 5 MG/ML IV SOLN: 10.0000 mg | INTRAVENOUS | Status: DC | PRN

## 2022-10-26 MED ORDER — ACETAMINOPHEN 325 MG PO TABS: 650.0000 mg | ORAL_TABLET | ORAL | Status: DC | PRN

## 2022-10-26 MED ORDER — SODIUM CHLORIDE 0.9 % WEIGHT BASED INFUSION: 1.0000 mL/kg/h | INTRAVENOUS | Status: DC

## 2022-10-26 MED ORDER — VERAPAMIL HCL 2.5 MG/ML IV SOLN
INTRAVENOUS | Status: AC
  Filled 2022-10-26: qty 2

## 2022-10-26 MED ORDER — IOHEXOL 350 MG/ML SOLN
INTRAVENOUS | Status: DC | PRN
  Administered 2022-10-26: 50 mL

## 2022-10-26 MED ORDER — LIDOCAINE HCL (PF) 1 % IJ SOLN
INTRAMUSCULAR | Status: AC
  Filled 2022-10-26: qty 30

## 2022-10-26 MED ORDER — MIDAZOLAM HCL 2 MG/2ML IJ SOLN
INTRAMUSCULAR | Status: DC | PRN
  Administered 2022-10-26: 2 mg via INTRAVENOUS

## 2022-10-26 MED ORDER — HEPARIN (PORCINE) IN NACL 1000-0.9 UT/500ML-% IV SOLN
INTRAVENOUS | Status: DC | PRN
  Administered 2022-10-26 (×2): 500 mL

## 2022-10-26 MED ORDER — HEPARIN SODIUM (PORCINE) 1000 UNIT/ML IJ SOLN
INTRAMUSCULAR | Status: DC | PRN
  Administered 2022-10-26: 3000 [IU] via INTRAVENOUS

## 2022-10-26 MED ORDER — HYDRALAZINE HCL 20 MG/ML IJ SOLN: 10.0000 mg | INTRAMUSCULAR | Status: DC | PRN

## 2022-10-26 MED ORDER — FENTANYL CITRATE (PF) 100 MCG/2ML IJ SOLN
INTRAMUSCULAR | Status: AC
  Filled 2022-10-26: qty 2

## 2022-10-26 MED ORDER — SODIUM CHLORIDE 0.9% FLUSH: 3.0000 mL | INTRAVENOUS | Status: DC | PRN

## 2022-10-26 MED ORDER — ONDANSETRON HCL 4 MG/2ML IJ SOLN: 4.0000 mg | Freq: Four times a day (QID) | INTRAMUSCULAR | Status: DC | PRN

## 2022-10-26 MED ORDER — ASPIRIN 81 MG PO CHEW
CHEWABLE_TABLET | ORAL | Status: AC
  Administered 2022-10-26: 81 mg via ORAL
  Filled 2022-10-26: qty 1

## 2022-10-26 MED ORDER — ASPIRIN 81 MG PO CHEW: 81.0000 mg | CHEWABLE_TABLET | Freq: Once | ORAL | Status: AC

## 2022-10-26 MED ORDER — HEPARIN (PORCINE) IN NACL 1000-0.9 UT/500ML-% IV SOLN
INTRAVENOUS | Status: AC
  Filled 2022-10-26: qty 500

## 2022-10-26 MED ORDER — LIDOCAINE HCL (PF) 1 % IJ SOLN
INTRAMUSCULAR | Status: DC | PRN
  Administered 2022-10-26: 5 mL

## 2022-10-26 MED ORDER — SODIUM CHLORIDE 0.9 % IV SOLN: INTRAVENOUS | Status: DC

## 2022-10-26 MED ORDER — FENTANYL CITRATE (PF) 100 MCG/2ML IJ SOLN
INTRAMUSCULAR | Status: DC | PRN
  Administered 2022-10-26: 25 ug via INTRAVENOUS

## 2022-10-26 MED ORDER — MIDAZOLAM HCL 2 MG/2ML IJ SOLN
INTRAMUSCULAR | Status: AC
  Filled 2022-10-26: qty 2

## 2022-10-26 MED ORDER — SODIUM CHLORIDE 0.9 % IV SOLN: 250.0000 mL | INTRAVENOUS | Status: DC | PRN

## 2022-10-26 MED ORDER — VERAPAMIL HCL 2.5 MG/ML IV SOLN
INTRAVENOUS | Status: DC | PRN
  Administered 2022-10-26: 10 mL via INTRA_ARTERIAL

## 2022-10-26 MED ORDER — HEPARIN SODIUM (PORCINE) 1000 UNIT/ML IJ SOLN
INTRAMUSCULAR | Status: AC
  Filled 2022-10-26: qty 10

## 2022-10-26 SURGICAL SUPPLY — 11 items
CATH INFINITI 5FR MULTPACK ANG (CATHETERS) IMPLANT
DEVICE RAD COMP TR BAND LRG (VASCULAR PRODUCTS) IMPLANT
DEVICE RAD TR BAND REGULAR (VASCULAR PRODUCTS) IMPLANT
ELECT DEFIB PAD ADLT CADENCE (PAD) IMPLANT
GLIDESHEATH SLEND SS 6F .021 (SHEATH) IMPLANT
GUIDEWIRE INQWIRE 1.5J.035X260 (WIRE) IMPLANT
INQWIRE 1.5J .035X260CM (WIRE) ×1
KIT HEART LEFT (KITS) ×1 IMPLANT
PACK CARDIAC CATHETERIZATION (CUSTOM PROCEDURE TRAY) ×1 IMPLANT
TRANSDUCER W/STOPCOCK (MISCELLANEOUS) ×1 IMPLANT
TUBING CIL FLEX 10 FLL-RA (TUBING) ×1 IMPLANT

## 2022-10-26 NOTE — Interval H&P Note (Signed)
History and Physical Interval Note:  10/26/2022 12:37 PM  Lynn Mcgee  has presented today for surgery, with the diagnosis of cad.  The various methods of treatment have been discussed with the patient and family. After consideration of risks, benefits and other options for treatment, the patient has consented to  Procedure(s): LEFT HEART CATH AND CORONARY ANGIOGRAPHY (N/A) as a surgical intervention.  The patient's history has been reviewed, patient examined, no change in status, stable for surgery.  I have reviewed the patient's chart and labs.  Questions were answered to the patient's satisfaction.     Sherren Mocha

## 2022-10-26 NOTE — Discharge Instructions (Signed)

## 2022-10-27 ENCOUNTER — Encounter (HOSPITAL_COMMUNITY): Payer: Self-pay | Admitting: Cardiovascular Disease

## 2022-10-27 MED FILL — Fentanyl Citrate Preservative Free (PF) Inj 100 MCG/2ML: INTRAMUSCULAR | Qty: 2 | Status: AC

## 2022-11-15 ENCOUNTER — Other Ambulatory Visit: Payer: Self-pay | Admitting: Pharmacist

## 2022-11-27 IMAGING — MR MR BREAST BILAT WO/W CM
8 of 12 series · 33 of 48 positions shown · IV contrast (5 ml gadavist)
Comparison: MRI breast 09/21/2021

CLINICAL DATA: Patient with history of right breast cancer status
post chemotherapy. Follow-up evaluation.

EXAM:
BILATERAL BREAST MRI WITH AND WITHOUT CONTRAST
TECHNIQUE: Multiplanar, multisequence MR images of both breasts were obtained
prior to and following the intravenous administration of 5 ml of
Gadavist

[Series 2: t2_tirm_tra ipat (a-p) · axial · 3.0mm · 0.66mm/px · 1 of 55 slices shown]
[im 1/55]
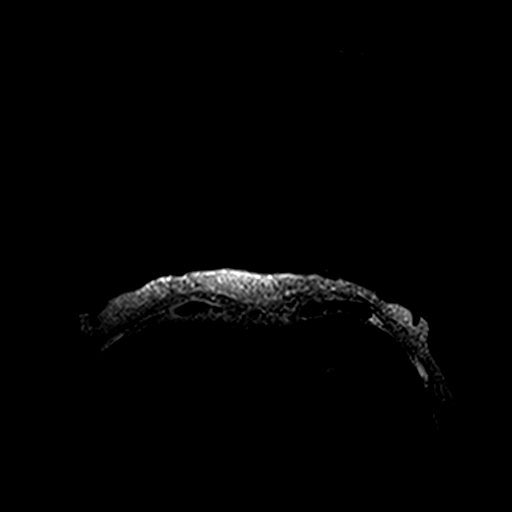

[Series 3: fl3d pre-cm no · axial · non-contrast · 1.2mm · 0.89mm/px · z∈[-75,+96]mm · 5 of 144 slices shown]
[im 1/144]
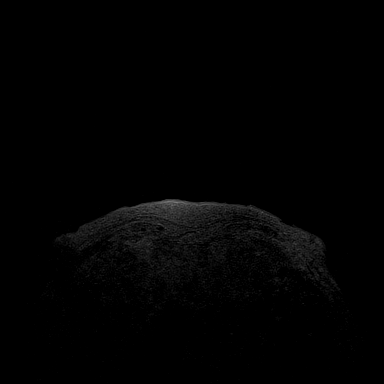
[im 36/144]
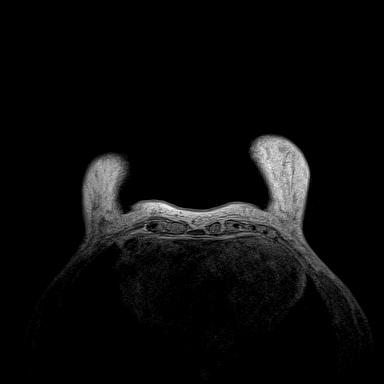
[im 72/144]
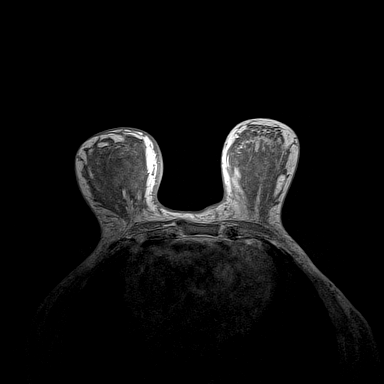
[im 108/144]
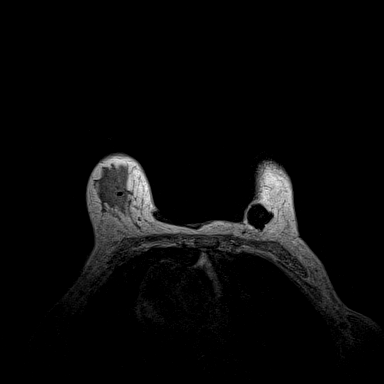
[im 144/144]
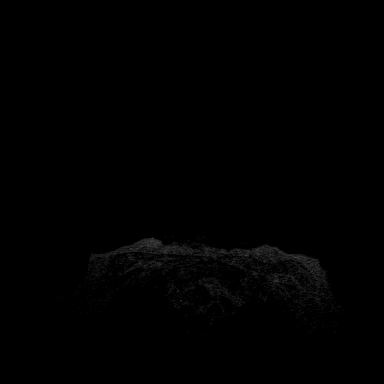

[Series 4: fl3d pre-cm · axial · non-contrast · 1.2mm · 0.89mm/px · z∈[-75,+96]mm · 5 of 144 slices shown]
[im 1/144]
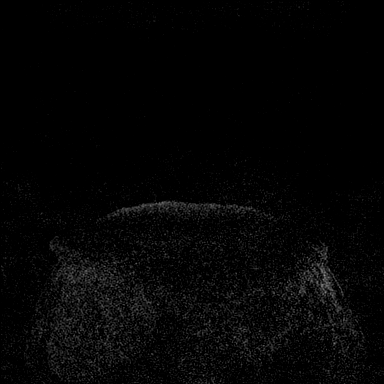
[im 36/144]
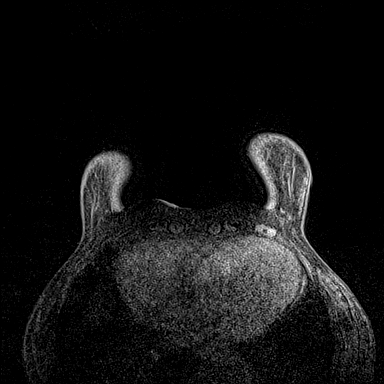
[im 72/144]
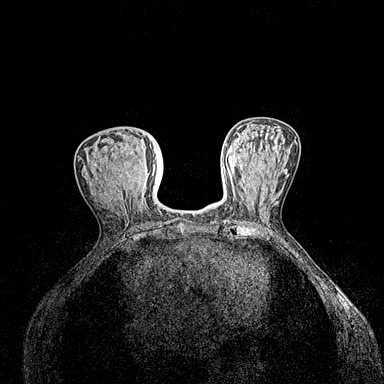
[im 108/144]
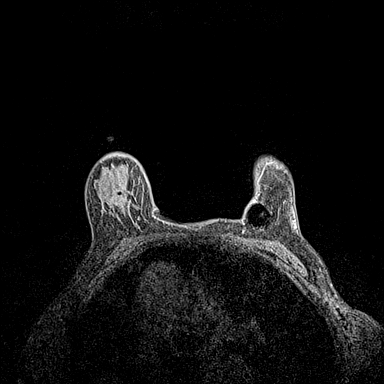
[im 144/144]
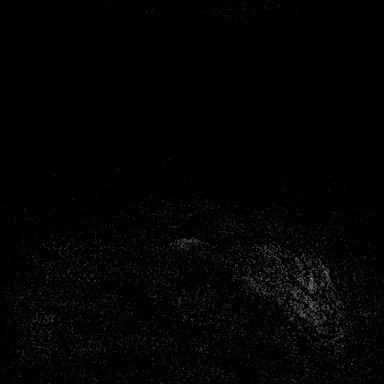

[Series 5: fl3d post-cm 20 · axial · 1.2mm · 0.89mm/px · z∈[-75,+96]mm · 5 of 144 slices shown (1 of 3)]
[im 1/144]
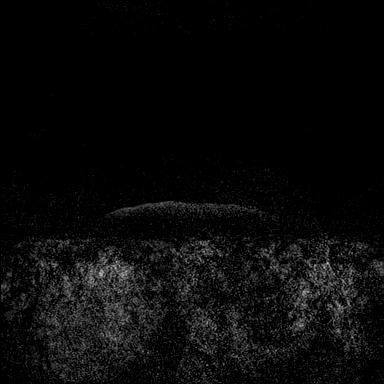
[im 36/144]
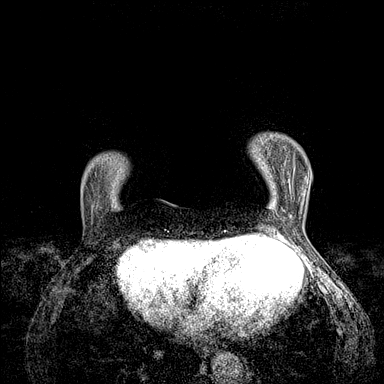
[im 72/144]
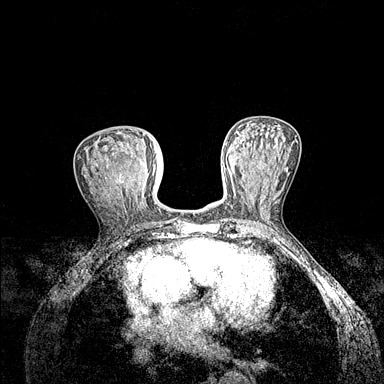
[im 108/144]
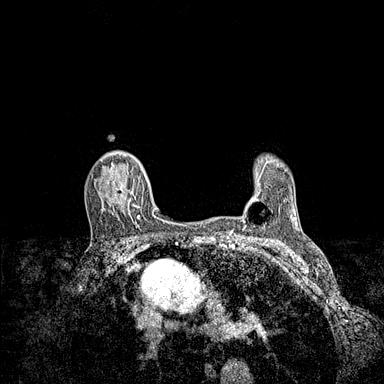
[im 144/144]
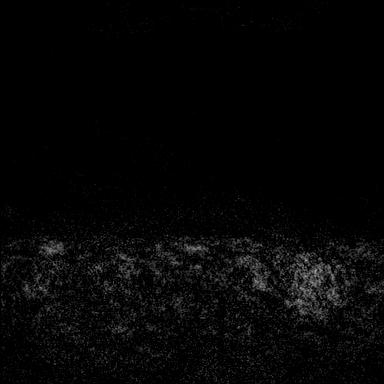

[Series 6: fl3d post-cm 20 · axial · 1.2mm · 0.89mm/px · z∈[-75,+96]mm · 5 of 144 slices shown (2 of 3)]
[im 1/144]
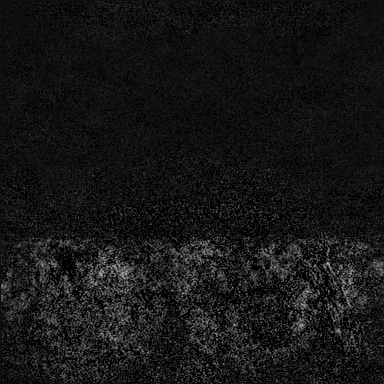
[im 36/144]
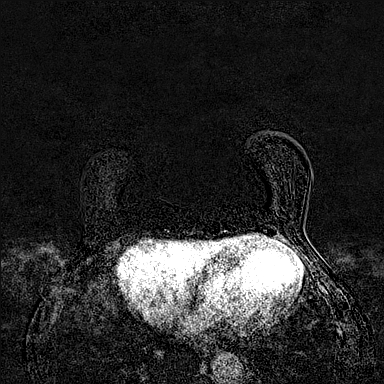
[im 72/144]
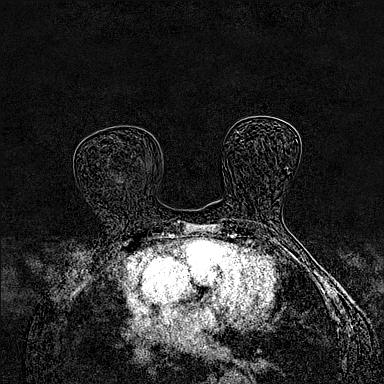
[im 108/144]
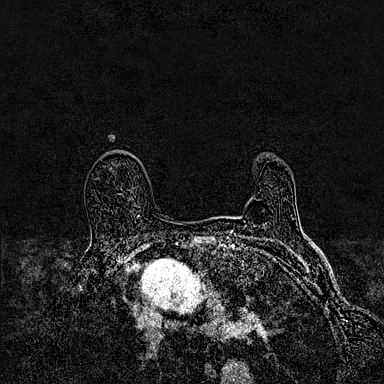
[im 144/144]
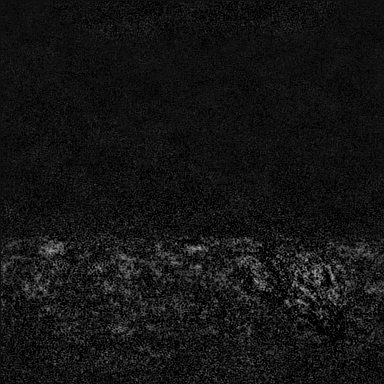

[Series 7: fl3d post-cm 20 · axial · 172.8mm · 0.89mm/px · 1 of 1 slices shown (3 of 3)]
[im 1/1]
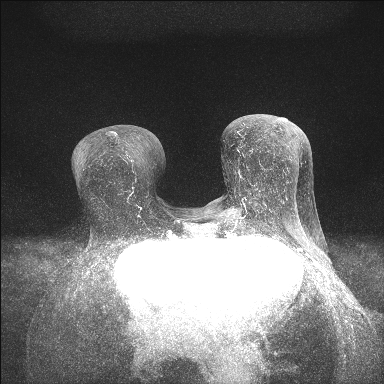

[Series 8: fl3d post-cm 3 · axial · 1.2mm · 0.89mm/px · z∈[-75,+96]mm · 6 of 144 slices shown (1 of 2)]
[im 1/144]
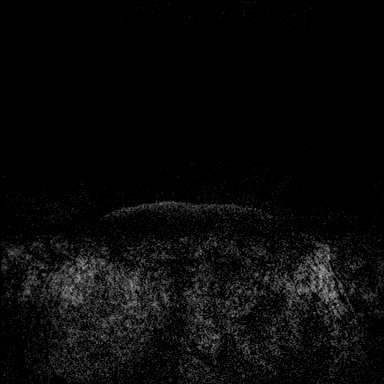
[im 29/144]
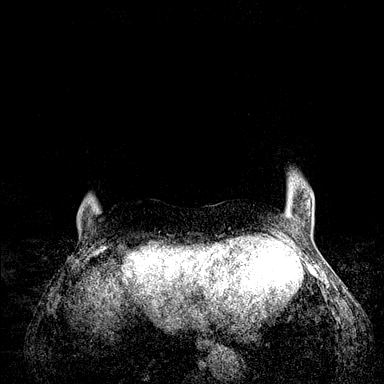
[im 58/144]
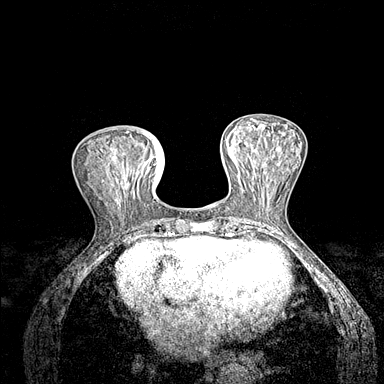
[im 86/144]
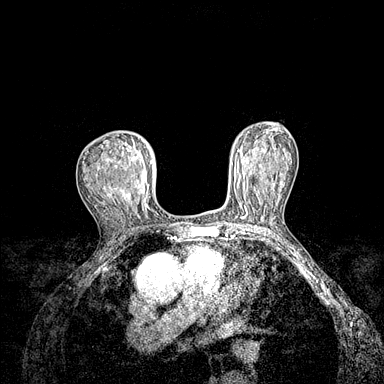
[im 115/144]
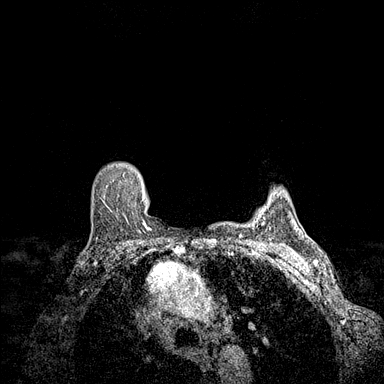
[im 144/144]
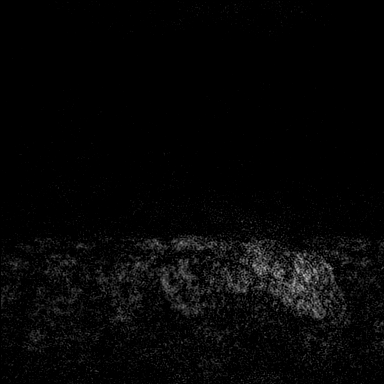

[Series 9: fl3d post-cm 3 · axial · 1.2mm · 0.89mm/px · z∈[-75,+61]mm · 5 of 144 slices shown (2 of 2)]
[im 1/144]
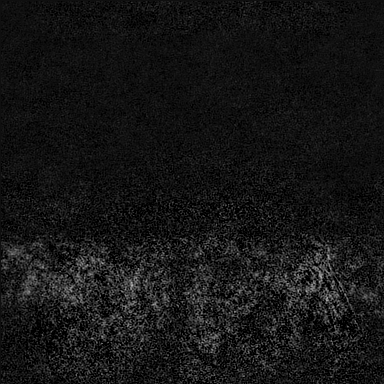
[im 29/144]
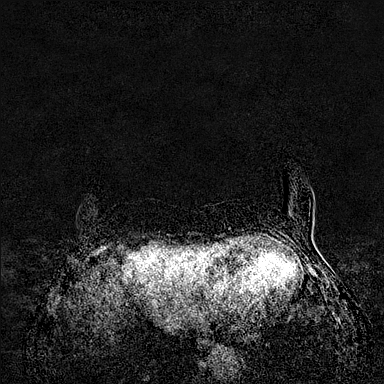
[im 58/144]
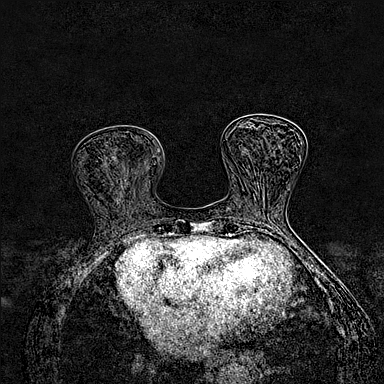
[im 86/144]
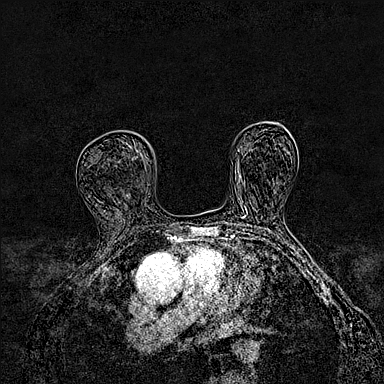
[im 115/144]
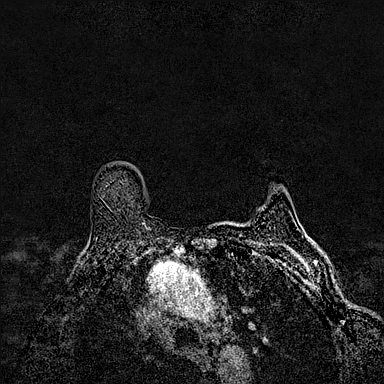

[33 of 48 positions shown; findings below may reference images not displayed]

Three-dimensional MR images were rendered by post-processing of the
original MR data on an independent workstation. The
three-dimensional MR images were interpreted, and findings are
reported in the following complete MRI report for this study. Three
dimensional images were evaluated at the independent interpreting
workstation using the DynaCAD thin client.
FINDINGS: Breast composition: c. Heterogeneous fibroglandular tissue.

Background parenchymal enhancement: Minimal

Right breast: Susceptibility artifact demonstrated within the
superior right breast at the site of prior biopsy. Significant
interval reduction and near complete resolution of previously
described mass and non-mass enhancement throughout the right breast.
There is mild residual skin thickening overlying the medial aspect
of the right breast.

Left breast: No mass or abnormal enhancement.

Lymph nodes: Interval resolution of previously described right
axillary and retropectoral adenopathy.

Ancillary findings:  Left anterior chest wall Port-A-Cath.
IMPRESSION: Significant interval decrease and near complete resolution of
previously described enhancement throughout the right breast
compatible with marked treatment response.

Interval resolution of previously described right axillary
adenopathy.

RECOMMENDATION:
Treatment plan for known right breast malignancy.

BI-RADS CATEGORY  6: Known biopsy-proven malignancy.

## 2022-11-30 ENCOUNTER — Telehealth: Payer: Self-pay | Admitting: Hematology and Oncology

## 2022-12-01 ENCOUNTER — Encounter: Payer: Self-pay | Admitting: Hematology and Oncology

## 2022-12-01 NOTE — Telephone Encounter (Signed)
Received a call from daughter Danae Chen in reference to mother and her port advised it was bothering her and wanted to know when she would be able to get it out and when her next appointment was, also advised she had a hernia near her naval, I told her I would pass the message along to her navigators and find out what was going on and what next steps should be taken

## 2022-12-05 ENCOUNTER — Ambulatory Visit: Payer: Medicare HMO | Admitting: Hematology and Oncology

## 2022-12-09 ENCOUNTER — Other Ambulatory Visit: Payer: Self-pay | Admitting: Surgery

## 2022-12-09 DIAGNOSIS — K429 Umbilical hernia without obstruction or gangrene: Secondary | ICD-10-CM | POA: Diagnosis not present

## 2022-12-22 ENCOUNTER — Inpatient Hospital Stay: Payer: Medicare HMO | Admitting: Hematology and Oncology

## 2022-12-22 ENCOUNTER — Telehealth: Payer: Self-pay | Admitting: Hematology and Oncology

## 2022-12-22 NOTE — Telephone Encounter (Signed)
Per 1/25 IB, msg left

## 2022-12-22 NOTE — Assessment & Plan Note (Deleted)
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 Neoadjuvant chemotherapy with TCHP (Herceptin Perjeta maintenance completed 09/02/2022) 2. 02/22/2022:Bilateral mastectomies: Pathologic complete response, 6 lymph nodes negative 3. Followed by adjuvant radiation therapy 04/07/2022-05/18/2022 --------------------------------------------------------------------------------------------------------------------------------- Peripheral neuropathy: 5 out of 10: She would like to learn more about the neuropathy clinical trial.  She tells is that the gabapentin is not helping her.  She is willing to stop it if she goes on the clinical trial.   Breast cancer surveillance: Breast exam 12/22/2022: Benign Breast imaging is not indicated since she had bilateral mastectomies.  Return to clinic in 1 year for follow-up

## 2023-01-10 ENCOUNTER — Inpatient Hospital Stay: Payer: Medicare HMO | Attending: Hematology and Oncology | Admitting: Hematology and Oncology

## 2023-01-10 ENCOUNTER — Other Ambulatory Visit: Payer: Self-pay

## 2023-01-10 VITALS — BP 155/94 | HR 65 | Temp 97.7°F | Resp 16 | Wt 124.4 lb

## 2023-01-10 DIAGNOSIS — Z923 Personal history of irradiation: Secondary | ICD-10-CM | POA: Insufficient documentation

## 2023-01-10 DIAGNOSIS — Z171 Estrogen receptor negative status [ER-]: Secondary | ICD-10-CM | POA: Diagnosis not present

## 2023-01-10 DIAGNOSIS — C50411 Malignant neoplasm of upper-outer quadrant of right female breast: Secondary | ICD-10-CM

## 2023-01-10 DIAGNOSIS — Z9221 Personal history of antineoplastic chemotherapy: Secondary | ICD-10-CM | POA: Insufficient documentation

## 2023-01-10 DIAGNOSIS — Z853 Personal history of malignant neoplasm of breast: Secondary | ICD-10-CM | POA: Insufficient documentation

## 2023-01-10 NOTE — Assessment & Plan Note (Addendum)
09/10/2021 right breast thickening and swelling: Mammogram revealed extremely hard right breast with peau d'orange skin changes, ultrasound revealed very large irregular mass measuring 11 cm with diffuse skin thickening, 3 abnormal right axillary lymph nodes, biopsy revealed grade 3 invasive mammary cancer with pleomorphic features, lymph node positive, ER 0%, PR 0%, HER2 3+, Ki-67 30%   Treatment plan: 1. Neoadjuvant chemotherapy with TCHP, Herceptin Perjeta completed 09/22/2022 2. 02/22/2022:Bilateral mastectomies: Pathologic complete response, 6 lymph nodes negative 3. Followed by adjuvant radiation therapy 04/07/2022-05/18/2022 ------------------------------------------------------------------------------------------------- Current treatment: Surveillance  Breast cancer surveillance: Breast exam 01/10/2023: Benign No role of mammograms since she had bilateral mastectomies   Return to clinic in 1 year for follow-up

## 2023-01-10 NOTE — Progress Notes (Signed)
Patient Care Team: Benito Mccreedy, MD as PCP - General (Internal Medicine) Nicholas Lose, MD as Consulting Physician (Hematology and Oncology) Gery Pray, MD as Consulting Physician (Radiation Oncology) [name], MD as Consulting Physician (General Surgery)  DIAGNOSIS:  Encounter Diagnosis  Name Primary?   Malignant neoplasm of upper-outer quadrant of right breast in female, estrogen receptor negative ([place]) Yes    SUMMARY OF ONCOLOGIC HISTORY: Oncology History  Malignant neoplasm of upper-outer quadrant of right breast in female, estrogen receptor negative ([place])  09/10/2021 Initial Diagnosis   Right breast thickening and swelling: Mammogram revealed extremely hard right breast with peau d'orange skin changes, ultrasound revealed very large irregular mass measuring 11 cm with diffuse skin thickening, 3 abnormal right axillary lymph nodes, biopsy revealed grade 3 invasive mammary cancer with pleomorphic features, lymph node positive, ER 0%, PR 0%, HER2 3+, Ki-67 30%   09/16/2021 Cancer Staging   Staging form: Breast, AJCC 8th Edition - Clinical stage from 09/16/2021: Stage IIIB (cT4d, cN1, cM0, G3, ER-, PR-, HER2+) - Signed by Nicholas Lose, MD on 09/16/2021 Stage prefix: Initial diagnosis Histologic grading system: 3 grade system   09/24/2021 - 01/17/2022 Chemotherapy   Patient is on Treatment Plan : BREAST  Docetaxel + Carboplatin + Trastuzumab + Pertuzumab  (TCHP) q21d      02/04/2022 - 09/22/2022 Chemotherapy   Patient is on Treatment Plan : BREAST Trastuzumab + Pertuzumab q21d     02/22/2022 Surgery   Bilateral mastectomies: Pathologic complete response, 6 lymph nodes negative   04/06/2022 - 05/18/2022 Radiation Therapy    04/06/2022 through 05/18/2022 Site Technique Total Dose (Gy) Dose per Fx (Gy) Completed Fx Beam Energies  Chest Wall, Right: CW_R 3D 50/50 2 25/25 6XFFF  Chest Wall, Right: CW_R_Bst Electron 10/10 2 5/5 6E  Sclav-RT: SCV_R 3D 50/50 2 25/25 6X,  10X      CHIEF COMPLIANT: Follow-up breast cancer surveillance   INTERVAL HISTORY: Lynn Mcgee is a  67 y.o. with above-mentioned history of right breast cancer, completed chemotherapy with TCHP. She presents to the clinic today for a follow-up. She reports that she still feel a little sore and tight under her right axilla. She says her chest is ok. Denies any pain or discomfort.   ALLERGIES:  has No Known Allergies.  MEDICATIONS:  Current Outpatient Medications  Medication Sig Dispense Refill   acetaminophen (TYLENOL) 500 MG tablet Take 1,000 mg by mouth every 6 (six) hours as needed for mild pain.     amLODipine (NORVASC) 5 MG tablet Take 1 tablet (5 mg total) by mouth daily. 90 tablet 3   aspirin EC 81 MG tablet Take 1 tablet (81 mg total) by mouth daily. Swallow whole. 90 tablet 3   atorvastatin (LIPITOR) 40 MG tablet Take 40 mg by mouth daily.     carvedilol (COREG) 6.25 MG tablet Take 1 tablet (6.25 mg total) by mouth 2 (two) times daily. 60 tablet 3   hydroquinone 4 % cream Apply topically 2 (two) times daily.     hydrOXYzine (ATARAX) 25 MG tablet Take 25 mg by mouth every 8 (eight) hours as needed for anxiety.     potassium chloride (KLOR-CON) 20 MEQ packet Take 20 mEq by mouth daily. 30 packet 1   sacubitril-valsartan (ENTRESTO) 49-51 MG Take 1 tablet by mouth 2 (two) times daily. 60 tablet 11   spironolactone (ALDACTONE) 25 MG tablet Take 1 tablet (25 mg total) by mouth daily. 30 tablet 6   No current facility-administered medications for this  visit.    PHYSICAL EXAMINATION: ECOG PERFORMANCE STATUS: 1 - Symptomatic but completely ambulatory  Vitals:   01/10/23 1420  BP: (!) 155/94  Pulse: 65  Resp: 16  Temp: 97.7 F (36.5 C)  SpO2: 98%   Filed Weights   01/10/23 1420  Weight: 124 lb 6.4 oz (56.4 kg)      LABORATORY DATA:  I have reviewed the data as listed    Latest Ref Rng & Units 10/26/2022    9:33 AM 09/01/2022   12:37 PM 08/10/2022    1:25 PM  CMP   Glucose 70 - 99 mg/dL 92  98  90   BUN 8 - 23 mg/dL 13  7  20   $ Creatinine 0.44 - 1.00 mg/dL 0.75  0.60  0.69   Sodium 135 - 145 mmol/L 140  139  140   Potassium 3.5 - 5.1 mmol/L 4.1  3.4  4.0   Chloride 98 - 111 mmol/L 109  105  110   CO2 22 - 32 mmol/L 23  29  24   $ Calcium 8.9 - 10.3 mg/dL 9.1  9.0  8.7   Total Protein 6.5 - 8.1 g/dL  7.2  7.0   Total Bilirubin 0.3 - 1.2 mg/dL  0.4  0.5   Alkaline Phos 38 - 126 U/L  86  82   AST 15 - 41 U/L  19  15   ALT 0 - 44 U/L  10  9     Lab Results  Component Value Date   WBC 4.5 10/26/2022   HGB 12.8 10/26/2022   HCT 40.1 10/26/2022   MCV 77.1 (L) 10/26/2022   PLT 342 10/26/2022   NEUTROABS 1.3 (L) 09/01/2022    ASSESSMENT & PLAN:  Malignant neoplasm of upper-outer quadrant of right breast in female, estrogen receptor negative ([place]) 09/10/2021 right breast thickening and swelling: Mammogram revealed extremely hard right breast with peau d'orange skin changes, ultrasound revealed very large irregular mass measuring 11 cm with diffuse skin thickening, 3 abnormal right axillary lymph nodes, biopsy revealed grade 3 invasive mammary cancer with pleomorphic features, lymph node positive, ER 0%, PR 0%, HER2 3+, Ki-67 30%   Treatment plan: 1. Neoadjuvant chemotherapy with TCHP, Herceptin Perjeta completed 09/22/2022 2. 02/22/2022:Bilateral mastectomies: Pathologic complete response, 6 lymph nodes negative 3. Followed by adjuvant radiation therapy 04/07/2022-05/18/2022 ------------------------------------------------------------------------------------------------- Current treatment: Surveillance  Breast cancer surveillance: Breast exam 01/10/2023: Benign No role of mammograms since she had bilateral mastectomies   I sent a message to Dr. Ninfa Linden to remove her port and also to discuss hernia surgery. I requested Dr. Iran Planas to see the patient for consultation for bilateral breast reconstructions.  Return to clinic in 1 year for  follow-up    No orders of the defined types were placed in this encounter.  The patient has a good understanding of the overall plan. she agrees with it. she will call with any problems that may develop before the next visit here. Total time spent: 30 mins including face to face time and time spent for planning, charting and co-ordination of care   Harriette Ohara, MD 01/10/23    I Gardiner Coins am acting as a Education administrator for [employer]  I have reviewed the above documentation for accuracy and completeness, and I agree with the above.

## 2023-01-13 DIAGNOSIS — F419 Anxiety disorder, unspecified: Secondary | ICD-10-CM | POA: Diagnosis not present

## 2023-01-13 DIAGNOSIS — I10 Essential (primary) hypertension: Secondary | ICD-10-CM | POA: Diagnosis not present

## 2023-01-13 DIAGNOSIS — E559 Vitamin D deficiency, unspecified: Secondary | ICD-10-CM | POA: Diagnosis not present

## 2023-01-13 DIAGNOSIS — Z0001 Encounter for general adult medical examination with abnormal findings: Secondary | ICD-10-CM | POA: Diagnosis not present

## 2023-01-13 DIAGNOSIS — Z720 Tobacco use: Secondary | ICD-10-CM | POA: Diagnosis not present

## 2023-01-13 DIAGNOSIS — Z23 Encounter for immunization: Secondary | ICD-10-CM | POA: Diagnosis not present

## 2023-01-13 DIAGNOSIS — E782 Mixed hyperlipidemia: Secondary | ICD-10-CM | POA: Diagnosis not present

## 2023-01-27 ENCOUNTER — Other Ambulatory Visit: Payer: Self-pay | Admitting: Surgery

## 2023-01-27 DIAGNOSIS — C50911 Malignant neoplasm of unspecified site of right female breast: Secondary | ICD-10-CM | POA: Diagnosis not present

## 2023-01-27 DIAGNOSIS — K439 Ventral hernia without obstruction or gangrene: Secondary | ICD-10-CM | POA: Diagnosis not present

## 2023-01-31 ENCOUNTER — Encounter: Payer: Medicare HMO | Admitting: Advanced Practice Midwife

## 2023-02-02 DIAGNOSIS — F419 Anxiety disorder, unspecified: Secondary | ICD-10-CM | POA: Diagnosis not present

## 2023-02-02 DIAGNOSIS — Z720 Tobacco use: Secondary | ICD-10-CM | POA: Diagnosis not present

## 2023-02-02 DIAGNOSIS — E559 Vitamin D deficiency, unspecified: Secondary | ICD-10-CM | POA: Diagnosis not present

## 2023-02-02 DIAGNOSIS — E782 Mixed hyperlipidemia: Secondary | ICD-10-CM | POA: Diagnosis not present

## 2023-02-02 DIAGNOSIS — I10 Essential (primary) hypertension: Secondary | ICD-10-CM | POA: Diagnosis not present

## 2023-02-22 NOTE — Progress Notes (Signed)
Patient's chart and cardiology notes and tests reviewed with Dr Valma Cava, [place] for [facility].

## 2023-02-23 ENCOUNTER — Other Ambulatory Visit: Payer: Self-pay

## 2023-02-23 ENCOUNTER — Encounter (HOSPITAL_BASED_OUTPATIENT_CLINIC_OR_DEPARTMENT_OTHER): Payer: Self-pay | Admitting: Surgery

## 2023-02-23 NOTE — Progress Notes (Signed)
Lynn Mcgee at Dr. Adela Ports office, patient takes aspirin daily and need to know how long to hold prior to surgery. She will call back with instructions.

## 2023-02-28 ENCOUNTER — Encounter (HOSPITAL_BASED_OUTPATIENT_CLINIC_OR_DEPARTMENT_OTHER)
Admission: RE | Admit: 2023-02-28 | Discharge: 2023-02-28 | Disposition: A | Discharge status: Home / Self Care | Payer: Medicare HMO | Source: Ambulatory Visit | Attending: Surgery | Admitting: Surgery

## 2023-02-28 DIAGNOSIS — Z01812 Encounter for preprocedural laboratory examination: Secondary | ICD-10-CM | POA: Diagnosis not present

## 2023-02-28 LAB — BASIC METABOLIC PANEL
Anion gap: 11 (ref 5–15)
BUN: 13 mg/dL (ref 8–23)
CO2: 23 mmol/L (ref 22–32)
Calcium: 9.2 mg/dL (ref 8.9–10.3)
Chloride: 105 mmol/L (ref 98–111)
Creatinine, Ser: 0.76 mg/dL (ref 0.44–1.00)
GFR, Estimated: >60 mL/min (ref >60)
Glucose, Bld: 114 mg/dL — ABNORMAL HIGH (ref 70–99)
Potassium: 3.6 mmol/L (ref 3.5–5.1)
Sodium: 139 mmol/L (ref 135–145)

## 2023-02-28 MED ORDER — CHLORHEXIDINE GLUCONATE CLOTH 2 % EX PADS: 6.0000 | MEDICATED_PAD | Freq: Once | CUTANEOUS | Status: DC

## 2023-02-28 MED ORDER — ENSURE PRE-SURGERY PO LIQD: 296.0000 mL | Freq: Once | ORAL | Status: DC

## 2023-02-28 NOTE — Progress Notes (Signed)

## 2023-02-28 NOTE — Progress Notes (Signed)
Received call from [facility] at Dr. Trevor Mace office regarding Aspirin use. She stated Dr. Ninfa Linden said to hold  Aspirin 4 days prior to surgery but that it would not be a problem if she forgets.

## 2023-03-01 ENCOUNTER — Encounter (HOSPITAL_BASED_OUTPATIENT_CLINIC_OR_DEPARTMENT_OTHER): Payer: Self-pay | Admitting: Surgery

## 2023-03-01 NOTE — H&P (Signed)
  PROVIDER: Beverlee Nims, MD  MRN: [id number] DOB: Jun 28, 1956  Subjective  Chief Complaint: Follow-up (Discuss PAC removal)   History of Present Illness: Lynn Mcgee is a 67 y.o. female who is seen today for a follow-up regarding her ventral hernia and Port-A-Cath. She has now completed treatment for inflammatory breast cancer and is here to discuss removal of the Port-A-Cath and repair of her ventral/umbilical hernia. Since I saw her a few months ago, she reports she has had no problems regarding the hernia. Had no obstructive symptoms..    Review of Systems: A complete review of systems was obtained from the patient. I have reviewed this information and discussed as appropriate with the patient. See HPI as well for other ROS.  ROS  Medical History: Past Medical History: Diagnosis Date Anxiety Hyperlipidemia Hypertension  Patient Active Problem List Diagnosis Acute congestive heart failure (CMS-HCC) Coronary artery disease involving native coronary artery of native heart without angina pectoris Elevated troponin Hypertension Leucocytosis Other hyperlipidemia Pain in lower limb Porokeratosis Renal artery stenosis (CMS-HCC) Tobacco use [place] Anxiety Malignant neoplasm of upper-outer quadrant of right breast in female, estrogen receptor negative Port-A-Cath in place S/P bilateral mastectomy Vitamin D deficiency Acute congestive heart failure (CMS-HCC) Mixed hyperlipidemia  Past Surgical History: Procedure Laterality Date Bilateral Breast Surgery   No Known Allergies  Current Outpatient Medications on File Prior to Visit Medication Sig Dispense Refill amLODIPine (NORVASC) 10 MG tablet Take 10 mg by mouth once daily atorvastatin (LIPITOR) 40 MG tablet Take by mouth ENTRESTO 49-51 mg tablet Take 1 tablet by mouth 2 (two) times daily  No current facility-administered medications on file prior to visit.  No family history on file.  Social  History  Tobacco Use Smoking Status Never Smokeless Tobacco Never   Social History  Socioeconomic History Marital status: Single Tobacco Use Smoking status: Never Smokeless tobacco: Never Vaping Use Vaping Use: Never used Substance and Sexual Activity Alcohol use: Not Currently Drug use: Never  Objective:  Vitals: PainSc: 0-No pain  There is no height or weight on file to calculate BMI.  Physical Exam  She appears well on exam  She still has the reducible hernia above the umbilicus. The fascial defect feels larger and may be up to 3 cm. It was mildly tender.  Port-A-Cath site is well-healed  Labs, Imaging and Diagnostic Testing:  Assessment and Plan:  Diagnoses and all orders for this visit:  Ventral hernia without obstruction or gangrene  Inflammatory breast cancer, right (CMS-HCC)  3 cm fascial defect from the ventral/umbilical hernia  Since she has now completed chemotherapy we can proceed with Port-A-Cath removal as well as the repair of the umbilical/ventral hernia with likely mesh. I again discussed the surgical procedures in detail including the risks. She understands and wishes to proceed with surgery which will be scheduled. We discussed the risks of bleeding, infection, the need to use mesh, hernia recurrence, injury to surrounding structures, cardiopulmonary issues, etc. We also discussed the risk of Port-A-Cath removal.

## 2023-03-01 NOTE — Anesthesia Preprocedure Evaluation (Signed)
Anesthesia Evaluation  Patient identified by MRN, date of birth, ID band Patient awake    Reviewed: Allergy & Precautions, NPO status , Patient's Chart, lab work & pertinent test results, reviewed documented beta blocker date and time   Airway Mallampati: I  TM Distance: >3 FB Neck ROM: Full    Dental  (+) Missing, Dental Advisory Given, Poor Dentition, Chipped, Caps,    Pulmonary Current Smoker and Patient abstained from smoking.   Pulmonary exam normal breath sounds clear to auscultation       Cardiovascular hypertension, Pt. on medications and Pt. on home beta blockers + CAD, + Peripheral Vascular Disease and +CHF  Normal cardiovascular exam+ Valvular Problems/Murmurs  Rhythm:Regular Rate:Normal  EKG 11/16;/23 NSR, LAE, LVH, non specific Inferior ST-T wave changes, unchanged from previous tracing  Echo 10/13/22 1. Left ventricular ejection fraction, by estimation, is 40 to 45%. Left  ventricular ejection fraction by 3D volume is 47 %. The left ventricle has  mildly decreased function. The left ventricle demonstrates global  hypokinesis. The left ventricular  internal cavity size was mildly dilated. Left ventricular diastolic  parameters are consistent with Grade I diastolic dysfunction (impaired  relaxation). The average left ventricular global longitudinal strain is  -12.1 %. The global longitudinal strain is  abnormal.   2. Right ventricular systolic function is normal. The right ventricular  size is normal.   3. Left atrial size was moderately dilated.   4. Right atrial size was mildly dilated.   5. The mitral valve is grossly normal. Trivial mitral valve  regurgitation. No evidence of mitral stenosis.   6. The aortic valve is tricuspid. There is mild calcification of the  aortic valve. Aortic valve regurgitation is mild. Aortic valve  sclerosis/calcification is present, without any evidence of aortic  stenosis. Aortic  valve Vmax measures 1.65 m/s.   Cardiac Cath 10/26/22 Left Main The vessel exhibits minimal luminal irregularities.  Left Anterior Descending There is mild diffuse disease throughout the vessel. The LAD is a large vessel that reaches the apex. There is mild diffuse nonobstructive plaquing present. The first diagonal is very large and essentially supplies a ramus territory, branching into multiple subbranches. Those vessels have mild nonobstructive plaquing. Mid LAD lesion is 40% stenosed. The lesion is mildly calcified.  First Diagonal Branch Vessel is large in size.  Left Circumflex There is mild diffuse disease throughout the vessel. The circumflex is small and distribution and supplies 2 small posterolateral branches. There is mild diffuse plaquing with no high-grade stenosis present.  Right Coronary Artery The RCA is a dominant vessel. It supplies a PDA and PLA branch distally. The proximal RCA has severe calcification at the junction of the proximal/mid vessel with 60% eccentric stenosis present. The mid vessel has mild nonobstructive disease. The distal vessel has diffuse 50% tapering, then bifurcates into the PDA and PLA branches. The posterior AV segment and the PLA branch has moderate 50% stenosis. The PDA is patent with mild nonobstructive plaquing. Prox RCA lesion is 60% stenosed. The lesion is severely calcified. Dist RCA lesion is 50% stenosed.  Right Posterior Atrioventricular Artery RPAV lesion is 50% stenosed.        Neuro/Psych   Anxiety     negative neurological ROS  negative psych ROS   GI/Hepatic negative GI ROS, Neg liver ROS,,,  Endo/Other  Hx/o right breast Ca S/P RT and ChemoRx + Right MRM Hyperlipidemia  Renal/GU negative Renal ROS  negative genitourinary   Musculoskeletal negative musculoskeletal ROS (+)  Abdominal   Peds  Hematology negative hematology ROS (+)   Anesthesia Other Findings   Reproductive/Obstetrics                              Anesthesia Physical Anesthesia Plan  ASA: 3  Anesthesia Plan: General   Post-op Pain Management: Precedex and Tylenol PO (pre-op)*   Induction: Intravenous  PONV Risk Score and Plan: 4 or greater and Treatment may vary due to age or medical condition, Ondansetron and Dexamethasone  Airway Management Planned: Oral ETT  Additional Equipment: None  Intra-op Plan:   Post-operative Plan: Extubation in OR  Informed Consent: I have reviewed the patients History and Physical, chart, labs and discussed the procedure including the risks, benefits and alternatives for the proposed anesthesia with the patient or authorized representative who has indicated his/her understanding and acceptance.     Dental advisory given  Plan Discussed with: CRNA and Anesthesiologist  Anesthesia Plan Comments:         Anesthesia Quick Evaluation

## 2023-03-02 ENCOUNTER — Encounter (HOSPITAL_BASED_OUTPATIENT_CLINIC_OR_DEPARTMENT_OTHER)
Admission: RE | Disposition: A | Discharge status: Home / Self Care | Payer: Self-pay | Source: Ambulatory Visit | Attending: Surgery

## 2023-03-02 ENCOUNTER — Encounter (HOSPITAL_BASED_OUTPATIENT_CLINIC_OR_DEPARTMENT_OTHER): Payer: Self-pay | Admitting: Surgery

## 2023-03-02 ENCOUNTER — Ambulatory Visit (HOSPITAL_BASED_OUTPATIENT_CLINIC_OR_DEPARTMENT_OTHER)
Admission: RE | Admit: 2023-03-02 | Discharge: 2023-03-02 | Disposition: A | Discharge status: Home / Self Care | Payer: Medicare HMO | Source: Ambulatory Visit | Attending: Surgery | Admitting: Surgery

## 2023-03-02 ENCOUNTER — Other Ambulatory Visit: Payer: Self-pay

## 2023-03-02 ENCOUNTER — Ambulatory Visit (HOSPITAL_BASED_OUTPATIENT_CLINIC_OR_DEPARTMENT_OTHER): Payer: Medicare HMO | Admitting: Anesthesiology

## 2023-03-02 DIAGNOSIS — K439 Ventral hernia without obstruction or gangrene: Secondary | ICD-10-CM

## 2023-03-02 DIAGNOSIS — I5031 Acute diastolic (congestive) heart failure: Secondary | ICD-10-CM | POA: Diagnosis not present

## 2023-03-02 DIAGNOSIS — Z171 Estrogen receptor negative status [ER-]: Secondary | ICD-10-CM | POA: Insufficient documentation

## 2023-03-02 DIAGNOSIS — Z452 Encounter for adjustment and management of vascular access device: Secondary | ICD-10-CM | POA: Insufficient documentation

## 2023-03-02 DIAGNOSIS — Z01818 Encounter for other preprocedural examination: Secondary | ICD-10-CM

## 2023-03-02 DIAGNOSIS — I509 Heart failure, unspecified: Secondary | ICD-10-CM | POA: Diagnosis not present

## 2023-03-02 DIAGNOSIS — I110 Hypertensive heart disease with heart failure: Secondary | ICD-10-CM | POA: Diagnosis not present

## 2023-03-02 DIAGNOSIS — K429 Umbilical hernia without obstruction or gangrene: Secondary | ICD-10-CM | POA: Insufficient documentation

## 2023-03-02 DIAGNOSIS — Z923 Personal history of irradiation: Secondary | ICD-10-CM | POA: Diagnosis not present

## 2023-03-02 DIAGNOSIS — I2510 Atherosclerotic heart disease of native coronary artery without angina pectoris: Secondary | ICD-10-CM | POA: Diagnosis not present

## 2023-03-02 DIAGNOSIS — E7849 Other hyperlipidemia: Secondary | ICD-10-CM | POA: Diagnosis not present

## 2023-03-02 DIAGNOSIS — Z9221 Personal history of antineoplastic chemotherapy: Secondary | ICD-10-CM | POA: Insufficient documentation

## 2023-03-02 DIAGNOSIS — F17200 Nicotine dependence, unspecified, uncomplicated: Secondary | ICD-10-CM | POA: Diagnosis not present

## 2023-03-02 DIAGNOSIS — C50411 Malignant neoplasm of upper-outer quadrant of right female breast: Secondary | ICD-10-CM | POA: Diagnosis not present

## 2023-03-02 HISTORY — PX: PORT-A-CATH REMOVAL: SHX5289

## 2023-03-02 HISTORY — PX: VENTRAL HERNIA REPAIR: SHX424

## 2023-03-02 SURGERY — REPAIR, HERNIA, VENTRAL
Anesthesia: General | Site: Chest

## 2023-03-02 MED ORDER — BUPIVACAINE-EPINEPHRINE 0.5% -1:200000 IJ SOLN
INTRAMUSCULAR | Status: DC | PRN
  Administered 2023-03-02: 30 mL

## 2023-03-02 MED ORDER — OXYCODONE HCL 5 MG PO TABS: 5.0000 mg | ORAL_TABLET | Freq: Once | ORAL | Status: DC | PRN

## 2023-03-02 MED ORDER — PROPOFOL 10 MG/ML IV BOLUS
INTRAVENOUS | Status: DC | PRN
  Administered 2023-03-02: 140 mg via INTRAVENOUS

## 2023-03-02 MED ORDER — HYDROMORPHONE HCL 1 MG/ML IJ SOLN
INTRAMUSCULAR | Status: AC
  Filled 2023-03-02: qty 0.5

## 2023-03-02 MED ORDER — LIDOCAINE 2% (20 MG/ML) 5 ML SYRINGE
INTRAMUSCULAR | Status: DC | PRN
  Administered 2023-03-02: 80 mg via INTRAVENOUS

## 2023-03-02 MED ORDER — ONDANSETRON HCL 4 MG/2ML IJ SOLN
INTRAMUSCULAR | Status: AC
  Filled 2023-03-02: qty 2

## 2023-03-02 MED ORDER — LIDOCAINE HCL (PF) 1 % IJ SOLN
INTRAMUSCULAR | Status: AC
  Filled 2023-03-02: qty 30

## 2023-03-02 MED ORDER — FENTANYL CITRATE (PF) 100 MCG/2ML IJ SOLN
INTRAMUSCULAR | Status: AC
  Filled 2023-03-02: qty 2

## 2023-03-02 MED ORDER — PHENYLEPHRINE 80 MCG/ML (10ML) SYRINGE FOR IV PUSH (FOR BLOOD PRESSURE SUPPORT)
PREFILLED_SYRINGE | INTRAVENOUS | Status: DC | PRN
  Administered 2023-03-02 (×3): 80 ug via INTRAVENOUS

## 2023-03-02 MED ORDER — FENTANYL CITRATE (PF) 250 MCG/5ML IJ SOLN
INTRAMUSCULAR | Status: DC | PRN
  Administered 2023-03-02: 100 ug via INTRAVENOUS

## 2023-03-02 MED ORDER — PHENYLEPHRINE HCL (PRESSORS) 10 MG/ML IV SOLN
INTRAVENOUS | Status: AC
  Filled 2023-03-02: qty 1

## 2023-03-02 MED ORDER — CEFAZOLIN SODIUM-DEXTROSE 2-4 GM/100ML-% IV SOLN
INTRAVENOUS | Status: AC
  Filled 2023-03-02: qty 100

## 2023-03-02 MED ORDER — ACETAMINOPHEN 500 MG PO TABS
ORAL_TABLET | ORAL | Status: AC
  Filled 2023-03-02: qty 2

## 2023-03-02 MED ORDER — LACTATED RINGERS IV SOLN: INTRAVENOUS | Status: DC

## 2023-03-02 MED ORDER — ACETAMINOPHEN 500 MG PO TABS
1000.0000 mg | ORAL_TABLET | ORAL | Status: AC
  Administered 2023-03-02: 1000 mg via ORAL

## 2023-03-02 MED ORDER — ROCURONIUM BROMIDE 10 MG/ML (PF) SYRINGE
PREFILLED_SYRINGE | INTRAVENOUS | Status: DC | PRN
  Administered 2023-03-02: 60 mg via INTRAVENOUS

## 2023-03-02 MED ORDER — SUGAMMADEX SODIUM 200 MG/2ML IV SOLN
INTRAVENOUS | Status: DC | PRN
  Administered 2023-03-02: 220 mg via INTRAVENOUS

## 2023-03-02 MED ORDER — TRAMADOL HCL 50 MG PO TABS: 50.0000 mg | ORAL_TABLET | Freq: Four times a day (QID) | ORAL | 0 refills | Status: DC | PRN

## 2023-03-02 MED ORDER — EPHEDRINE SULFATE-NACL 50-0.9 MG/10ML-% IV SOSY
PREFILLED_SYRINGE | INTRAVENOUS | Status: DC | PRN
  Administered 2023-03-02: 10 mg via INTRAVENOUS
  Administered 2023-03-02: 5 mg via INTRAVENOUS

## 2023-03-02 MED ORDER — MIDAZOLAM HCL 2 MG/2ML IJ SOLN
INTRAMUSCULAR | Status: AC
  Filled 2023-03-02: qty 2

## 2023-03-02 MED ORDER — DEXAMETHASONE SODIUM PHOSPHATE 10 MG/ML IJ SOLN
INTRAMUSCULAR | Status: AC
  Filled 2023-03-02: qty 1

## 2023-03-02 MED ORDER — HYDROMORPHONE HCL 1 MG/ML IJ SOLN
0.2500 mg | INTRAMUSCULAR | Status: DC | PRN
  Administered 2023-03-02: 0.25 mg via INTRAVENOUS

## 2023-03-02 MED ORDER — MIDAZOLAM HCL 5 MG/5ML IJ SOLN
INTRAMUSCULAR | Status: DC | PRN
  Administered 2023-03-02: 2 mg via INTRAVENOUS

## 2023-03-02 MED ORDER — OXYCODONE HCL 5 MG/5ML PO SOLN: 5.0000 mg | Freq: Once | ORAL | Status: DC | PRN

## 2023-03-02 MED ORDER — ONDANSETRON HCL 4 MG/2ML IJ SOLN
4.0000 mg | Freq: Once | INTRAMUSCULAR | Status: AC | PRN
  Administered 2023-03-02: 4 mg via INTRAVENOUS

## 2023-03-02 MED ORDER — BUPIVACAINE-EPINEPHRINE (PF) 0.5% -1:200000 IJ SOLN
INTRAMUSCULAR | Status: AC
  Filled 2023-03-02: qty 1.8

## 2023-03-02 MED ORDER — DEXAMETHASONE SODIUM PHOSPHATE 10 MG/ML IJ SOLN
INTRAMUSCULAR | Status: DC | PRN
  Administered 2023-03-02: 4 mg via INTRAVENOUS

## 2023-03-02 MED ORDER — ONDANSETRON HCL 4 MG/2ML IJ SOLN
INTRAMUSCULAR | Status: DC | PRN
  Administered 2023-03-02: 4 mg via INTRAVENOUS

## 2023-03-02 MED ORDER — BUPIVACAINE-EPINEPHRINE (PF) 0.5% -1:200000 IJ SOLN
INTRAMUSCULAR | Status: AC
  Filled 2023-03-02: qty 30

## 2023-03-02 MED ORDER — SODIUM BICARBONATE 4.2 % IV SOLN
INTRAVENOUS | Status: AC
  Filled 2023-03-02: qty 10

## 2023-03-02 MED ORDER — CEFAZOLIN SODIUM-DEXTROSE 2-4 GM/100ML-% IV SOLN
2.0000 g | INTRAVENOUS | Status: AC
  Administered 2023-03-02: 2 g via INTRAVENOUS

## 2023-03-02 SURGICAL SUPPLY — 54 items
ADH SKN CLS APL DERMABOND .7 (GAUZE/BANDAGES/DRESSINGS) ×4
APL PRP STRL LF DISP 70% ISPRP (MISCELLANEOUS) ×2
BLADE CLIPPER SURG (BLADE) IMPLANT
BLADE SURG 10 STRL SS (BLADE) IMPLANT
BLADE SURG 15 STRL LF DISP TIS (BLADE) ×2 IMPLANT
BLADE SURG 15 STRL SS (BLADE) ×2
CANISTER SUCT 1200ML W/VALVE (MISCELLANEOUS) IMPLANT
CHLORAPREP W/TINT 26 (MISCELLANEOUS) ×2 IMPLANT
CLEANER CAUTERY TIP 5X5 PAD (MISCELLANEOUS) ×2 IMPLANT
COVER BACK TABLE 60X90IN (DRAPES) ×2 IMPLANT
COVER MAYO STAND STRL (DRAPES) ×2 IMPLANT
DERMABOND ADVANCED .7 DNX12 (GAUZE/BANDAGES/DRESSINGS) ×4 IMPLANT
DRAPE LAPAROTOMY 100X72 PEDS (DRAPES) ×2 IMPLANT
DRAPE UTILITY XL STRL (DRAPES) ×2 IMPLANT
DRSG TEGADERM 2-3/8X2-3/4 SM (GAUZE/BANDAGES/DRESSINGS) IMPLANT
ELECT REM PT RETURN 9FT ADLT (ELECTROSURGICAL) ×2
ELECTRODE REM PT RTRN 9FT ADLT (ELECTROSURGICAL) ×2 IMPLANT
GAUZE SPONGE 4X4 12PLY STRL (GAUZE/BANDAGES/DRESSINGS) IMPLANT
GLOVE BIOGEL PI IND STRL 8.5 (GLOVE) IMPLANT
GLOVE SURG SIGNA 7.5 PF LTX (GLOVE) ×2 IMPLANT
GOWN STRL REUS W/ TWL LRG LVL3 (GOWN DISPOSABLE) ×2 IMPLANT
GOWN STRL REUS W/ TWL XL LVL3 (GOWN DISPOSABLE) ×2 IMPLANT
GOWN STRL REUS W/TWL LRG LVL3 (GOWN DISPOSABLE) ×2
GOWN STRL REUS W/TWL XL LVL3 (GOWN DISPOSABLE) ×2
MESH VENTRALEX ST 2.5 CRC MED (Mesh General) IMPLANT
NDL HYPO 25X1 1.5 SAFETY (NEEDLE) ×2 IMPLANT
NEEDLE HYPO 25X1 1.5 SAFETY (NEEDLE) ×2 IMPLANT
NS IRRIG 1000ML POUR BTL (IV SOLUTION) IMPLANT
PACK BASIN DAY SURGERY FS (CUSTOM PROCEDURE TRAY) ×2 IMPLANT
PENCIL SMOKE EVACUATOR (MISCELLANEOUS) ×2 IMPLANT
SLEEVE SCD COMPRESS KNEE MED (STOCKING) ×2 IMPLANT
SPIKE FLUID TRANSFER (MISCELLANEOUS) IMPLANT
SPONGE T-LAP 4X18 ~~LOC~~+RFID (SPONGE) ×2 IMPLANT
STAPLER VISISTAT 35W (STAPLE) IMPLANT
SUT ETHIBOND 0 MO6 C/R (SUTURE) IMPLANT
SUT MNCRL AB 4-0 PS2 18 (SUTURE) ×2 IMPLANT
SUT NOVA NAB DX-16 0-1 5-0 T12 (SUTURE) IMPLANT
SUT NOVA NAB GS-21 1 T12 (SUTURE) IMPLANT
SUT VIC AB 2-0 SH 27 (SUTURE)
SUT VIC AB 2-0 SH 27XBRD (SUTURE) IMPLANT
SUT VIC AB 3-0 FS2 27 (SUTURE) IMPLANT
SUT VIC AB 3-0 SH 27 (SUTURE) ×2
SUT VIC AB 3-0 SH 27X BRD (SUTURE) ×2 IMPLANT
SUT VIC AB 4-0 RB1 27 (SUTURE)
SUT VIC AB 4-0 RB1 27X BRD (SUTURE) IMPLANT
SUT VIC AB 5-0 PS2 18 (SUTURE) IMPLANT
SUT VICRYL 4-0 PS2 18IN ABS (SUTURE) IMPLANT
SUT VICRYL AB 2 0 TIE (SUTURE) IMPLANT
SUT VICRYL AB 2 0 TIES (SUTURE)
SYR BULB EAR ULCER 3OZ GRN STR (SYRINGE) IMPLANT
SYR CONTROL 10ML LL (SYRINGE) ×2 IMPLANT
TOWEL GREEN STERILE FF (TOWEL DISPOSABLE) ×2 IMPLANT
TUBE CONNECTING 20X1/4 (TUBING) IMPLANT
YANKAUER SUCT BULB TIP NO VENT (SUCTIONS) IMPLANT

## 2023-03-02 NOTE — Op Note (Signed)
   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  Her Port-A-Cath is no longer needed.  The plan will be to proceed with Port-A-Cath removal.  She also has a ventral hernia above the umbilicus with an approximately size to 3 cm fascial defect.  Ventral hernia repair with mesh is also planned  Findings: The patient was found to have a 3 cm fascial defect several inches above the umbilicus.  The hernia was repaired with a 6.4 cm round ventral Prolene patch from [facility]  Procedure: The patient was brought to the operating room and identified the correct patient.  She was placed supine on the operating room table and general anesthesia was induced.  Her abdomen and chest were then prepped and draped in usual sterile fashion.  The hernia was located several inches above the umbilicus.  I made a vertical incision in the patient's midline above the umbilicus with a scalpel.  I then took this down to the hernia sac which was easily identified.  Patient had a large hernia sac but nothing was contained in the sac nor been reduced.  The fascial defect itself was 3 cm in size.  I excised the sac with the cautery.  A 6.4 cm round Prolene patch from [facility] was brought to the field.  It was placed through the fascial opening and then pulled up against the peritoneum with the ties.  I sutured the mesh in place circumferentially with #1 refill sutures.  I then cut the ties and closed the fascia over the top of the mesh with a  figure-of-eight and interrupted #1 Novafil suture.  Wide coverage of the fascial defect appeared to be achieved.  I anesthetized the fascia and skin with Marcaine.  I then closed the subcutaneous tissue with interrupted 3-0 Vicryl sutures and closed the skin with a running 4-0 Monocryl.  I next made an incision on the patient's left chest at the scar from her Port-A-Cath insertion.  I dissected down to the port and catheter which were easy identified.  I then easily removed the port and the catheter in its entirety.  I anesthetized the incision with Marcaine.  I then closed the subcutaneous tissue with interrupted 3-0 Vicryl sutures and closed the skin with a running 4-0 Monocryl.  Dermabond was then applied.  The patient tolerated the procedure well.  All the counts were correct at the end of the procedure.  The patient was then extubated in the operating room and taken in a stable condition to the recovery room.          [name]   Date: 03/02/2023  Time: 8:41 AM

## 2023-03-02 NOTE — Transfer of Care (Signed)
Immediate Anesthesia Transfer of Care Note  Patient: Lynn Mcgee  Procedure(s) Performed: VENTRAL HERNIA REPAIR WITH MESH (Abdomen) PORT-A-CATH REMOVAL (Left: Chest)  Patient Location: PACU  Anesthesia Type:General  Level of Consciousness: oriented and patient cooperative  Airway & Oxygen Therapy: Patient Spontanous Breathing and Patient connected to face mask oxygen  Post-op Assessment: Report given to RN, Post -op Vital signs reviewed and stable, and Patient moving all extremities X 4  Post vital signs: Reviewed and stable  Last Vitals:  Vitals Value Taken Time  BP    Temp    Pulse    Resp    SpO2      Last Pain:  Vitals:   03/02/23 0705  TempSrc: Oral  PainSc: 0-No pain      Patients Stated Pain Goal: 1 ([id number])  Complications: No notable events documented.

## 2023-03-02 NOTE — Anesthesia Postprocedure Evaluation (Signed)
Anesthesia Post Note  Patient: Lynn Mcgee  Procedure(s) Performed: VENTRAL HERNIA REPAIR WITH MESH (Abdomen) PORT-A-CATH REMOVAL (Left: Chest)     Patient location during evaluation: PACU Anesthesia Type: General Level of consciousness: awake and alert and oriented Pain management: pain level controlled Vital Signs Assessment: post-procedure vital signs reviewed and stable Respiratory status: spontaneous breathing, nonlabored ventilation and respiratory function stable Cardiovascular status: blood pressure returned to baseline and stable Postop Assessment: no apparent nausea or vomiting Anesthetic complications: no   No notable events documented.  Last Vitals:  Vitals:   03/02/23 0915 03/02/23 0936  BP: (!) 142/82 (!) 149/77  Pulse: 69 70  Resp: (!) 22 20  Temp:  (!) 36.2 C  SpO2: 90% 94%    Last Pain:  Vitals:   03/02/23 0936  TempSrc: Oral  PainSc: 0-No pain                 Orville Mena A.

## 2023-03-02 NOTE — Discharge Instructions (Addendum)
[facility]  UMBILICAL OR INGUINAL HERNIA REPAIR: POST OP INSTRUCTIONS  Always review your discharge instruction sheet given to you by the facility where your surgery was performed. IF YOU HAVE DISABILITY OR FAMILY LEAVE FORMS, YOU MUST BRING THEM TO THE OFFICE FOR PROCESSING.   DO NOT GIVE THEM TO YOUR DOCTOR.  1. A  prescription for pain medication may be given to you upon discharge.  Take your pain medication as prescribed, if needed.  If narcotic pain medicine is not needed, then you may take acetaminophen (Tylenol) or ibuprofen (Advil) as needed. 2. Take your usually prescribed medications unless otherwise directed. If you need a refill on your pain medication, please contact your pharmacy.  They will contact our office to request authorization. Prescriptions will not be filled after 5 pm or on week-ends. 3. You should follow a light diet the first 24 hours after arrival home, such as soup and crackers, etc.  Be sure to include lots of fluids daily.  Resume your normal diet the day after surgery. 4.Most patients will experience some swelling and bruising around the umbilicus or in the groin and scrotum.  Ice packs and reclining will help.  Swelling and bruising can take several days to resolve.  6. It is common to experience some constipation if taking pain medication after surgery.  Increasing fluid intake and taking a stool softener (such as Colace) will usually help or prevent this problem from occurring.  A mild laxative (Milk of Magnesia or Miralax) should be taken according to package directions if there are no bowel movements after 48 hours. 7. Unless discharge instructions indicate otherwise, you may remove your bandages 24-48 hours after surgery, and you may shower at that time.  You may have steri-strips (small skin tapes) in place directly over the incision.  These strips should be left on the skin for 7-10 days.  If your surgeon used skin glue on the  incision, you may shower in 24 hours.  The glue will flake off over the next 2-3 weeks.  Any sutures or staples will be removed at the office during your follow-up visit. 8. ACTIVITIES:  You may resume regular (light) daily activities beginning the next day--such as daily self-care, walking, climbing stairs--gradually increasing activities as tolerated.  You may have sexual intercourse when it is comfortable.  Refrain from any heavy lifting or straining until approved by your doctor.  a.You may drive when you are no longer taking prescription pain medication, you can comfortably wear a seatbelt, and you can safely maneuver your car and apply brakes. b.RETURN TO WORK:   _____________________________________________  9.You should see your doctor in the office for a follow-up appointment approximately 2-3 weeks after your surgery.  Make sure that you call for this appointment within a day or two after you arrive home to insure a convenient appointment time. 10.OTHER INSTRUCTIONS: _you may shower starting tomorrow Ice pack, tylenol, and ibuprofen also for pain No lifting more than 15 pounds for 4 weeks________________________    _____________________________________  WHEN TO CALL YOUR DOCTOR: Fever over 101.0 Inability to urinate Nausea and/or vomiting Extreme swelling or bruising Continued bleeding from incision. Increased pain, redness, or drainage from the incision  The clinic staff is available to answer your questions during regular business hours.  Please don't hesitate to call and ask to speak to one of the nurses for clinical concerns.  If you have a medical emergency, go to the nearest emergency room or call 911.  A surgeon from [facility] is always on call at the hospital   [address], [place], [place], [place]  [address] ?  [address], [place], [place]   [address] [phone] ? [phone] ? FAX [phone] Web site:  [email or web address]   Post Anesthesia Home Care Instructions  Activity: Get plenty of rest for the remainder of the day. A responsible individual must stay with you for 24 hours following the procedure.  For the next 24 hours, DO NOT: -Drive a car -Paediatric nurse -Drink alcoholic beverages -Take any medication unless instructed by your physician -Make any legal decisions or sign important papers.  Meals: Start with liquid foods such as gelatin or soup. Progress to regular foods as tolerated. Avoid greasy, spicy, heavy foods. If nausea and/or vomiting occur, drink only clear liquids until the nausea and/or vomiting subsides. Call your physician if vomiting continues.  Special Instructions/Symptoms: Your throat may feel dry or sore from the anesthesia or the breathing tube placed in your throat during surgery. If this causes discomfort, gargle with warm salt water. The discomfort should disappear within 24 hours.  Can take Tylenol after 1:08 pm if needed

## 2023-03-02 NOTE — Interval H&P Note (Signed)
History and Physical Interval Note:no change in H and P  03/02/2023 7:31 AM  Lynn Mcgee  has presented today for surgery, with the diagnosis of VENTRAL HERNIA, PORT NO LONGER NEEDED.  The various methods of treatment have been discussed with the patient and family. After consideration of risks, benefits and other options for treatment, the patient has consented to  Procedure(s) with comments: [place] (N/A) - LMA PORT-A-CATH REMOVAL (N/A) as a surgical intervention.  The patient's history has been reviewed, patient examined, no change in status, stable for surgery.  I have reviewed the patient's chart and labs.  Questions were answered to the patient's satisfaction.     [name]

## 2023-03-02 NOTE — Anesthesia Procedure Notes (Signed)
Procedure Name: Intubation Date/Time: 03/02/2023 7:58 AM  Performed by: Lowella Dell, CRNAPre-anesthesia Checklist: Patient identified, Emergency Drugs available, Suction available and Patient being monitored Patient Re-evaluated:Patient Re-evaluated prior to induction Oxygen Delivery Method: Circle System Utilized Preoxygenation: Pre-oxygenation with 100% oxygen Induction Type: IV induction Ventilation: Mask ventilation without difficulty and Oral airway inserted - appropriate to patient size Laryngoscope Size: Mac and 3 Grade View: Grade I Tube type: Oral Number of attempts: 1 Airway Equipment and Method: Stylet and Oral airway Placement Confirmation: ETT inserted through vocal cords under direct vision, positive ETCO2 and breath sounds checked- equal and bilateral Secured at: 21 cm Tube secured with: Tape Dental Injury: Teeth and Oropharynx as per pre-operative assessment

## 2023-03-03 ENCOUNTER — Encounter (HOSPITAL_BASED_OUTPATIENT_CLINIC_OR_DEPARTMENT_OTHER): Payer: Self-pay | Admitting: Surgery

## 2023-03-24 DIAGNOSIS — K439 Ventral hernia without obstruction or gangrene: Secondary | ICD-10-CM | POA: Diagnosis not present

## 2023-07-03 DIAGNOSIS — Z720 Tobacco use: Secondary | ICD-10-CM | POA: Diagnosis not present

## 2023-07-03 DIAGNOSIS — E559 Vitamin D deficiency, unspecified: Secondary | ICD-10-CM | POA: Diagnosis not present

## 2023-07-03 DIAGNOSIS — E782 Mixed hyperlipidemia: Secondary | ICD-10-CM | POA: Diagnosis not present

## 2023-07-03 DIAGNOSIS — F419 Anxiety disorder, unspecified: Secondary | ICD-10-CM | POA: Diagnosis not present

## 2023-07-03 DIAGNOSIS — I10 Essential (primary) hypertension: Secondary | ICD-10-CM | POA: Diagnosis not present

## 2023-07-03 DIAGNOSIS — R197 Diarrhea, unspecified: Secondary | ICD-10-CM | POA: Diagnosis not present

## 2023-07-13 DIAGNOSIS — H524 Presbyopia: Secondary | ICD-10-CM | POA: Diagnosis not present

## 2023-07-13 DIAGNOSIS — H5213 Myopia, bilateral: Secondary | ICD-10-CM | POA: Diagnosis not present

## 2023-07-14 DIAGNOSIS — Z9013 Acquired absence of bilateral breasts and nipples: Secondary | ICD-10-CM | POA: Diagnosis not present

## 2023-07-14 DIAGNOSIS — C50911 Malignant neoplasm of unspecified site of right female breast: Secondary | ICD-10-CM | POA: Diagnosis not present

## 2023-07-14 DIAGNOSIS — Z9012 Acquired absence of left breast and nipple: Secondary | ICD-10-CM | POA: Diagnosis not present

## 2023-08-09 ENCOUNTER — Telehealth (HOSPITAL_COMMUNITY): Payer: Self-pay | Admitting: Cardiology

## 2023-08-09 DIAGNOSIS — I10 Essential (primary) hypertension: Secondary | ICD-10-CM | POA: Diagnosis not present

## 2023-08-09 DIAGNOSIS — E559 Vitamin D deficiency, unspecified: Secondary | ICD-10-CM | POA: Diagnosis not present

## 2023-08-09 DIAGNOSIS — R0602 Shortness of breath: Secondary | ICD-10-CM | POA: Diagnosis not present

## 2023-08-09 DIAGNOSIS — E782 Mixed hyperlipidemia: Secondary | ICD-10-CM | POA: Diagnosis not present

## 2023-08-09 DIAGNOSIS — F419 Anxiety disorder, unspecified: Secondary | ICD-10-CM | POA: Diagnosis not present

## 2023-08-09 DIAGNOSIS — Z720 Tobacco use: Secondary | ICD-10-CM | POA: Diagnosis not present

## 2023-08-09 NOTE — Telephone Encounter (Signed)
Patients daughter called with a request for appt. Reports pt was advised to follow up after recent visit at PCP   Pt reports severe fatigue,increased SOB, weakness (unable to walk throughout home), and inflammation.   Pcp gave steroid and inhaler-follow up with cards   Add on 9/12 @ 3

## 2023-08-10 ENCOUNTER — Encounter: Payer: Self-pay | Admitting: Hematology and Oncology

## 2023-08-10 ENCOUNTER — Other Ambulatory Visit (HOSPITAL_COMMUNITY): Payer: Self-pay

## 2023-08-10 ENCOUNTER — Encounter (HOSPITAL_COMMUNITY): Payer: Self-pay

## 2023-08-10 ENCOUNTER — Ambulatory Visit (HOSPITAL_COMMUNITY)
Admission: RE | Admit: 2023-08-10 | Discharge: 2023-08-10 | Disposition: A | Discharge status: Home / Self Care | Payer: Medicare HMO | Source: Ambulatory Visit | Attending: Internal Medicine | Admitting: Internal Medicine

## 2023-08-10 ENCOUNTER — Telehealth (HOSPITAL_COMMUNITY): Payer: Self-pay

## 2023-08-10 VITALS — BP 130/84 | HR 77 | Wt 126.8 lb

## 2023-08-10 DIAGNOSIS — C50411 Malignant neoplasm of upper-outer quadrant of right female breast: Secondary | ICD-10-CM | POA: Diagnosis not present

## 2023-08-10 DIAGNOSIS — Z853 Personal history of malignant neoplasm of breast: Secondary | ICD-10-CM | POA: Insufficient documentation

## 2023-08-10 DIAGNOSIS — I7121 Aneurysm of the ascending aorta, without rupture: Secondary | ICD-10-CM | POA: Insufficient documentation

## 2023-08-10 DIAGNOSIS — Z79899 Other long term (current) drug therapy: Secondary | ICD-10-CM | POA: Diagnosis not present

## 2023-08-10 DIAGNOSIS — Z720 Tobacco use: Secondary | ICD-10-CM | POA: Diagnosis not present

## 2023-08-10 DIAGNOSIS — I2510 Atherosclerotic heart disease of native coronary artery without angina pectoris: Secondary | ICD-10-CM | POA: Insufficient documentation

## 2023-08-10 DIAGNOSIS — I10 Essential (primary) hypertension: Secondary | ICD-10-CM

## 2023-08-10 DIAGNOSIS — Z171 Estrogen receptor negative status [ER-]: Secondary | ICD-10-CM | POA: Diagnosis not present

## 2023-08-10 DIAGNOSIS — I5022 Chronic systolic (congestive) heart failure: Secondary | ICD-10-CM | POA: Diagnosis not present

## 2023-08-10 DIAGNOSIS — I110 Hypertensive heart disease with heart failure: Secondary | ICD-10-CM | POA: Diagnosis not present

## 2023-08-10 DIAGNOSIS — F17210 Nicotine dependence, cigarettes, uncomplicated: Secondary | ICD-10-CM | POA: Insufficient documentation

## 2023-08-10 LAB — BASIC METABOLIC PANEL
Anion gap: 14 (ref 5–15)
BUN: 11 mg/dL (ref 8–23)
CO2: 22 mmol/L (ref 22–32)
Calcium: 8.9 mg/dL (ref 8.9–10.3)
Chloride: 105 mmol/L (ref 98–111)
Creatinine, Ser: 0.87 mg/dL (ref 0.44–1.00)
GFR, Estimated: >60 mL/min (ref >60)
Glucose, Bld: 93 mg/dL (ref 70–99)
Potassium: 3.5 mmol/L (ref 3.5–5.1)
Sodium: 141 mmol/L (ref 135–145)

## 2023-08-10 LAB — BRAIN NATRIURETIC PEPTIDE: B Natriuretic Peptide: 1261.5 pg/mL — ABNORMAL HIGH (ref 0.0–100.0)

## 2023-08-10 MED ORDER — METOPROLOL TARTRATE 25 MG PO TABS: 25.0000 mg | ORAL_TABLET | Freq: Once | ORAL | 0 refills | Status: DC

## 2023-08-10 MED ORDER — EMPAGLIFLOZIN 10 MG PO TABS: 10.0000 mg | ORAL_TABLET | Freq: Every day | ORAL | 3 refills | Status: AC

## 2023-08-10 NOTE — Progress Notes (Signed)
ADVANCED HF CLINIC  NOTE  Referring Physician: Dr Pamelia Hoit  Primary Care:Dr Osei Bonsu Primary Cardiologist: none  Oncology: Dr Pamelia Hoit.   HPI: Lynn Mcgee is a 67 y.o. with history of HTN, smoker, breast cancer, chronic systolic HF  Diagnosed with HF in 10/21 at [facility]. - Echo 10/21 ([facility]) EF 40-45% - Myoview 10/21 EF 36% ? anterior wall defect -> treated medically   Subsequently found to have breast CA. Followed by Dr Pamelia Hoit - Stage IIIB (cT4d, cN1, cM0, G3, ER-, PR-, HER2+).   Pre-chemo echo 10/22: EF 45-50% , Grade I DD RV normal   Started Docetaxel + Carboplatin + Trastuzumab + Pertuzumab  (TCHP) q21d on 09/23/21.   Scans on 09/28/21 no sign of metastatic disease.   - Echo 11/09/21; EF 45-50% Global HK. Cleda Daub was increased to 25 mg daily  - Echo 07/11/22 EF 45-50%  - Coronary CT 9/23 with 3v CAD.  1. LAD: findings 0.84, 0.80 0.71 very distal vessel D1: 0.91, 0.79 D2: 0.81 2. LCX: findings 0.77 3.  RCA: findings 0.68, 0.58 0.50    PDA: 0.53  Has finished Herceptin/Perjeta.    -Echo 10/13/22: EF 40-45% - LHC 11/23 with moderate CAD. Plan for medical management.   Today she returns for AHF follow up. Overall feeling better today. Recently started to experience SOB and fatigue so she scheduled this f/u. Symptoms started early last week. She went to her PCP yesterday who prescribed prednisone taper and 5 days worth of lasix.  Feels some fullness in her belly. Went to her PCP yesterday and got 5 days of lasix and a prednisone taper. Appetite good. No fever or chills. Does not weight at home. Taking all medications. Has a couple of beers every now and then. Smokes about 1/2 pack /day but is trying to cut back. Discussed with her daughter over the phone.   Past Medical History:  Diagnosis Date   Cancer [facility])    right breast cancer   CHF (congestive heart failure) (HCC)    per patient   Heart murmur    pt states MD stated she had heart murmur "years ago"   History of  radiation therapy    Right chest wall 04/06/22-05/18/22- Dr. Antony Blackbird   Hypertension    Current Outpatient Medications  Medication Sig Dispense Refill   acetaminophen (TYLENOL) 500 MG tablet Take 1,000 mg by mouth every 6 (six) hours as needed for mild pain.     albuterol (VENTOLIN HFA) 108 (90 Base) MCG/ACT inhaler Inhale into the lungs.     aspirin EC 81 MG tablet Take 1 tablet (81 mg total) by mouth daily. Swallow whole. 90 tablet 3   atorvastatin (LIPITOR) 40 MG tablet Take 40 mg by mouth daily.     carvedilol (COREG) 6.25 MG tablet Take 1 tablet (6.25 mg total) by mouth 2 (two) times daily. 60 tablet 3   furosemide (LASIX) 20 MG tablet 2 TABS DAY ONE THEN 1 TAB DAY 2-5 orally once a day     hydroquinone 4 % cream Apply topically 2 (two) times daily.     hydrOXYzine (ATARAX) 25 MG tablet Take 25 mg by mouth every 8 (eight) hours as needed for anxiety.     potassium chloride (KLOR-CON) 20 MEQ packet Take 20 mEq by mouth daily. 30 packet 1   predniSONE (DELTASONE) 20 MG tablet 1 tab(s) orally once a day for 5 days     sacubitril-valsartan (ENTRESTO) 49-51 MG Take 1 tablet by mouth  2 (two) times daily. 60 tablet 11   spironolactone (ALDACTONE) 25 MG tablet Take 1 tablet (25 mg total) by mouth daily. 30 tablet 6   No current facility-administered medications for this encounter.   No Known Allergies  Social History   Socioeconomic History   Marital status: Single    Spouse name: Not on file   Number of children: Not on file   Years of education: Not on file   Highest education level: Not on file  Occupational History   Not on file  Tobacco Use   Smoking status: Every Day    Current packs/day: 0.50    Average packs/day: 0.5 packs/day for 35.0 years (17.5 ttl pk-yrs)    Types: Cigarettes   Smokeless tobacco: Never  Vaping Use   Vaping status: Never Used  Substance and Sexual Activity   Alcohol use: Not Currently    Comment: "way back"   Drug use: Yes    Types: Marijuana     Comment: "occasionally" every couple of months   Sexual activity: Not on file  Other Topics Concern   Not on file  Social History Narrative   Not on file   Social Determinants of Health   Financial Resource Strain: Not on file  Food Insecurity: Not on file  Transportation Needs: Not on file  Physical Activity: Not on file  Stress: Not on file  Social Connections: Not on file  Intimate Partner Violence: Not on file   Vitals:   08/10/23 1509  BP: 130/84  Pulse: 77  SpO2: 92%  Weight: 57.5 kg (126 lb 12.8 oz)   PHYSICAL EXAM: General:  well appearing.  No respiratory difficulty HEENT: normal Neck: supple. JVD ~7 cm. Carotids 2+ bilat; no bruits. No lymphadenopathy or thyromegaly appreciated. Cor: PMI nondisplaced. Regular rate & rhythm. No rubs, gallops or murmurs. Lungs: clear Abdomen: soft, nontender, nondistended. No hepatosplenomegaly. No bruits or masses. Good bowel sounds. Extremities: no cyanosis, clubbing, rash, edema  Neuro: alert & oriented x 3, cranial nerves grossly intact. moves all 4 extremities w/o difficulty. Affect pleasant.   Wt Readings from Last 3 Encounters:  08/10/23 57.5 kg (126 lb 12.8 oz)  03/02/23 55.2 kg (121 lb 11.1 oz)  01/10/23 56.4 kg (124 lb 6.4 oz)    ReDS 33 %  ASSESSMENT & PLAN: 1. Chronic systolic HF - Echo 10/21 ([facility] [place]) EF 40-45% - Myoview 10/21 EF 36% ? anterior wall defect -> treated medically - CT chest 10/22 + 3v coronary calcium - Echo 10/22 (pre-chemo) EF 45-50% No RWMA  - Echo 11/09/21 EF 50-55%  - Echo 5/23 EF 45-50% - Echo  07/11/22 EF 45-50%  - Echo 10/13/22: EF 40-45% - LHC 11/23 with moderate CAD - Stable NYHA I-II Volume status stable. ReDS 33% - will stop lasix taper given by PCP with addition of SGLT2i - Continue Entresto 49/51 bid - Continue spiro 25 mg daily - Continue carvedilol at 6.25 mg  bid - Start Jardiance 10 mg daily, check A1c, BMET and BNP today. Plan to repeat BMET 1 week.  - Update echo at  f/u  2. Breast Cancer  - Stage IIIB (cT4d, cN1, cM0, G3, ER-, PR-, HER2+)  - Had Echo 09/22/21 EF 45-50% -->Pre chemo - Started on  Docetaxel + Carboplatin + Trastuzumab + Pertuzumab  (TCHP) q21d  on 09/23/21 - Echo 11/09/21 EF 50-55%  - Echo 5/23 EF 45-50% - Echo  07/11/22 EF 45-50%  - Finished Herceptin/perjeta - Echo 10/13/22: EF 40-45%  3. CAD - cardiac CT with 3v cad - LHC 11/23 with moderate CAD. Calcific CAD with moderate stenosis in the proximal and distal RCA. Plan for medical management.  - denies CP - continue statin and ASA  4. HTN  -Meds as above  5. 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 Gala Romney + echo   Alen Bleacher, NP  3:21 PM

## 2023-08-10 NOTE — Telephone Encounter (Signed)
Advanced Heart Failure Patient Advocate Encounter  The patient was approved for a [employer] grant that will help cover the cost of Carvedilol, Entresto, Jardiance, Metoprolol, Spironolactone.  Total amount awarded, $10,000.  Effective: 07/11/2023 - 07/09/2024.  BIN [id number] PCN [id number] Group [id number] ID [id number]  Patient provided with approval and processing information in office.  Burnell Blanks, CPhT Rx Patient Advocate Phone: [phone]

## 2023-08-10 NOTE — Patient Instructions (Signed)
Medication Changes:  START: JARDIANCE 10MG  ONCE DAILY   TAKE METOPROLOL TARTRATE 25MG  TWO HOURS PRIOR TO CCTA SCAN   Lab Work:  Labs done today, your results will be available in MyChart, we will contact you for abnormal readings.  Testing/Procedures:  Your physician has requested that you have cardiac CT. Cardiac computed tomography (CT) is a painless test that uses an x-ray machine to take clear, detailed pictures of your heart. For further information please visit [email or web address] Please follow instruction sheet as given.  Your physician has requested that you have an echocardiogram. Echocardiography is a painless test that uses sound waves to create images of your heart. It provides your doctor with information about the size and shape of your heart and how well your heart's chambers and valves are working. This procedure takes approximately one hour. There are no restrictions for this procedure. Please do NOT wear cologne, perfume, aftershave, or lotions (deodorant is allowed). Please arrive 15 minutes prior to your appointment time.  Special Instructions // Education:    Your cardiac CT will be scheduled at one of the below locations:   [facility] [address] [place], [place] [address] [phone]  If scheduled at [facility], please arrive at the [facility] (Entrance C2) of [facility] 30 minutes prior to test start time. You can use the FREE valet parking offered at entrance C (encouraged to control the heart rate for the test)  Proceed to the [facility] Radiology Department (first floor) to check-in and test prep.  All radiology patients and guests should use entrance C2 at [facility], accessed from [facility], even though the hospital's physical address listed is [address]     There is spacious parking and easy access to the radiology department from the [facility] Heart and  Vascular entrance. Please enter here and check-in with the desk attendant.   Please follow these instructions carefully (unless otherwise directed):  An IV will be required for this test and Nitroglycerin will be given.  Hold all erectile dysfunction medications at least 3 days (72 hrs) prior to test. (Ie viagra, cialis, sildenafil, tadalafil, etc)   On the Night Before the Test: Be sure to Drink plenty of water. Do not consume any caffeinated/decaffeinated beverages or chocolate 12 hours prior to your test. Do not take any antihistamines 12 hours prior to your test.  On the Day of the Test: Drink plenty of water until 1 hour prior to the test. Do not eat any food 1 hour prior to test. You may take your regular medications prior to the test.  Take metoprolol (Lopressor) two hours prior to test. If you take Furosemide/Hydrochlorothiazide/Spironolactone, please HOLD on the morning of the test. FEMALES- please wear underwire-free bra if available, avoid dresses & tight clothing  After the Test: Drink plenty of water. After receiving IV contrast, you may experience a mild flushed feeling. This is normal. On occasion, you may experience a mild rash up to 24 hours after the test. This is not dangerous. If this occurs, you can take Benadryl 25 mg and increase your fluid intake. If you experience trouble breathing, this can be serious. If it is severe call 911 IMMEDIATELY. If it is mild, please call our office. If you take any of these medications: Glipizide/Metformin, Avandament, Glucavance, please do not take 48 hours after completing test unless otherwise instructed.  We will call to schedule your test 2-4 weeks out understanding that  some insurance companies will need an authorization prior to the service being performed.   For more information and frequently asked questions, please visit our website : [email or web address]  For non-scheduling related questions, please contact  the cardiac imaging nurse navigator should you have any questions/concerns: Cardiac Imaging Nurse Navigators Direct Office Dial: [phone]   For scheduling needs, including cancellations and rescheduling, please call [place], [phone].  Follow-Up in: 3-4 MONTHS WITH DR. Gala Romney PLEASE CALL OUR OFFICE AROUND NOVEMBER TO GET SCHEDULED FOR YOUR APPOINTMENT. PHONE NUMBER IS [phone] OPTION 2    At the [facility], you and your health needs are our priority. We have a designated team specialized in the treatment of Heart Failure. This Care Team includes your primary Heart Failure Specialized Cardiologist (physician), Advanced Practice Providers (APPs- Physician Assistants and Nurse Practitioners), and Pharmacist who all work together to provide you with the care you need, when you need it.   You may see any of the following providers on your designated Care Team at your next follow up:  Dr. Arvilla Meres Dr. Marca Ancona Dr. Marcos Eke, NP Robbie Lis, [place] [facility], [place] Brynda Peon, NP Karle Plumber, PharmD   Please be sure to bring in all your medications bottles to every appointment.   Need to Contact us:  If you have any questions or concerns before your next appointment please send [place] a message through [place] or call our office at [phone].    TO LEAVE A MESSAGE FOR THE NURSE SELECT OPTION 2, PLEASE LEAVE A MESSAGE INCLUDING: YOUR NAME DATE OF BIRTH CALL BACK NUMBER REASON FOR CALL**this is important as we prioritize the call backs  YOU WILL RECEIVE A CALL BACK THE SAME DAY AS LONG AS YOU CALL BEFORE 4:00 PM

## 2023-08-11 LAB — HEMOGLOBIN A1C
Hgb A1c MFr Bld: 5.4 % (ref 4.8–5.6)
Mean Plasma Glucose: 108 mg/dL

## 2023-08-21 ENCOUNTER — Telehealth (HOSPITAL_COMMUNITY): Payer: Self-pay | Admitting: Cardiology

## 2023-08-21 ENCOUNTER — Other Ambulatory Visit (HOSPITAL_COMMUNITY): Payer: Medicare HMO

## 2023-08-21 NOTE — Telephone Encounter (Signed)
Pt aware of  9/12 lab results

## 2023-08-23 ENCOUNTER — Telehealth (HOSPITAL_COMMUNITY): Payer: Self-pay | Admitting: *Deleted

## 2023-08-23 ENCOUNTER — Telehealth: Payer: Self-pay | Admitting: *Deleted

## 2023-08-23 NOTE — Telephone Encounter (Signed)
Attempted to call patient regarding upcoming cardiac CT appointment. Voicemail was full but was able to send an SMS notification.  Larey Brick RN Navigator Cardiac Imaging [facility] Heart and Vascular Services [phone] Office [phone] Cell

## 2023-08-24 ENCOUNTER — Telehealth (HOSPITAL_COMMUNITY): Payer: Self-pay | Admitting: *Deleted

## 2023-08-24 NOTE — Telephone Encounter (Signed)
Attempted to call patient regarding upcoming cardiac CT appointment. °Left message on voicemail with name and callback number ° °Edie Vallandingham RN Navigator Cardiac Imaging °[facility] °[phone] Office °[phone] Cell ° °

## 2023-08-25 ENCOUNTER — Ambulatory Visit (HOSPITAL_COMMUNITY)
Admission: RE | Admit: 2023-08-25 | Discharge: 2023-08-25 | Disposition: A | Discharge status: Home / Self Care | Payer: Medicare HMO | Source: Ambulatory Visit | Attending: Internal Medicine

## 2023-08-25 ENCOUNTER — Other Ambulatory Visit: Payer: Self-pay | Admitting: Cardiology

## 2023-08-25 ENCOUNTER — Ambulatory Visit (HOSPITAL_BASED_OUTPATIENT_CLINIC_OR_DEPARTMENT_OTHER)
Admission: RE | Admit: 2023-08-25 | Discharge: 2023-08-25 | Disposition: A | Discharge status: Home / Self Care | Payer: Self-pay | Source: Ambulatory Visit | Attending: Cardiology | Admitting: Cardiology

## 2023-08-25 DIAGNOSIS — I2510 Atherosclerotic heart disease of native coronary artery without angina pectoris: Secondary | ICD-10-CM | POA: Insufficient documentation

## 2023-08-25 DIAGNOSIS — I5022 Chronic systolic (congestive) heart failure: Secondary | ICD-10-CM | POA: Insufficient documentation

## 2023-08-25 DIAGNOSIS — I7121 Aneurysm of the ascending aorta, without rupture: Secondary | ICD-10-CM | POA: Diagnosis not present

## 2023-08-25 DIAGNOSIS — R931 Abnormal findings on diagnostic imaging of heart and coronary circulation: Secondary | ICD-10-CM | POA: Diagnosis not present

## 2023-08-25 MED ORDER — METOPROLOL TARTRATE 5 MG/5ML IV SOLN
INTRAVENOUS | Status: AC
  Filled 2023-08-25: qty 5

## 2023-08-25 MED ORDER — NITROGLYCERIN 0.4 MG SL SUBL
0.8000 mg | SUBLINGUAL_TABLET | Freq: Once | SUBLINGUAL | Status: AC
  Administered 2023-08-25: 0.8 mg via SUBLINGUAL

## 2023-08-25 MED ORDER — DILTIAZEM HCL 25 MG/5ML IV SOLN: 10.0000 mg | INTRAVENOUS | Status: DC | PRN

## 2023-08-25 MED ORDER — METOPROLOL TARTRATE 5 MG/5ML IV SOLN
10.0000 mg | Freq: Once | INTRAVENOUS | Status: AC | PRN
  Administered 2023-08-25: 5 mg via INTRAVENOUS

## 2023-08-25 MED ORDER — IOHEXOL 350 MG/ML SOLN
100.0000 mL | Freq: Once | INTRAVENOUS | Status: AC | PRN
  Administered 2023-08-25: 100 mL via INTRAVENOUS

## 2023-08-25 MED ORDER — NITROGLYCERIN 0.4 MG SL SUBL
SUBLINGUAL_TABLET | SUBLINGUAL | Status: AC
  Filled 2023-08-25: qty 2

## 2023-08-28 ENCOUNTER — Other Ambulatory Visit (HOSPITAL_COMMUNITY): Payer: Medicare HMO

## 2023-09-01 ENCOUNTER — Telehealth (HOSPITAL_COMMUNITY): Payer: Self-pay | Admitting: Cardiology

## 2023-09-01 NOTE — Telephone Encounter (Signed)
Patient daughter called with frustrations Reports  patient has received multiple medication adjustments without a clear understanding. Reports meds changed at OV with NP and PCP recently.   Requested CT results-results reviewed Requested fu appt with Dr Andre Lefort 11/4 @245   Reports pt is still have increased SOB at times and mild edema despite recent med changes. Would really like to review med/med education-pharm visit 10/7 @1 

## 2023-09-04 NOTE — Progress Notes (Incomplete)
***In Progress***    Advanced Heart Failure Clinic Note  Primary Care:Dr Osei Bonsu Primary Cardiologist: none  Oncology: Dr Pamelia Hoit  HPI:  Lynn Mcgee is a 67 y.o. with history of HTN, smoker, breast cancer, chronic systolic HF   Diagnosed with HF in 08/2020 at [facility]. - Echo 08/2020 ([place]) EF 40-45% - Myoview 08/2020 EF 36% ? anterior wall defect -> treated medically    Subsequently found to have breast CA. Followed by Dr Pamelia Hoit - Stage IIIB (cT4d, cN1, cM0, G3, ER-, PR-, HER2+).    Pre-chemo echo 08/2021: EF 45-50% , Grade I DD RV normal    Started Docetaxel + Carboplatin + Trastuzumab + Pertuzumab  (TCHP) q21d on 09/23/21.    Scans on 09/28/21 no sign of metastatic disease.    - Echo 11/09/21; EF 45-50% Global HK. Cleda Daub was increased to 25 mg daily  - Echo 07/11/22 EF 45-50%  - Coronary CT 07/2022 with 3v CAD.  1. LAD: findings 0.84, 0.80 0.71 very distal vessel D1: 0.91, 0.79 D2: 0.81 2. LCX: findings 0.77 3.  RCA: findings 0.68, 0.58 0.50    PDA: 0.53   Has finished Herceptin/Perjeta.     -Echo 10/13/22: EF 40-45% - LHC 11/23 with moderate CAD. Plan for medical management.    Today she returns for AHF follow up. Overall feeling better today. Recently started to experience SOB and fatigue so she scheduled this f/u. Symptoms started early last week. She went to her PCP yesterday who prescribed prednisone taper and 5 days worth of lasix. Feels some fullness in her belly. Went to her PCP yesterday and got 5 days of lasix and a prednisone taper. Appetite good. No fever or chills. Does not weight at home. Taking all medications. Has a couple of beers every now and then. Smokes about 1/2 pack /day but is trying to cut back. Discussed with her daughter over the phone.    Today she returns to HF clinic for pharmacist medication titration. At last visit with MD, Jardiance 10 mg daily was initiated and Lasix was discontinued.   Shortness of breath/dyspnea on exertion? {YES  NO:22349}  Orthopnea/PND? {YES NO:22349} Edema? {YES NO:22349} Lightheadedness/dizziness? {YES NO:22349} Daily weights at home? {YES NO:22349} Blood pressure/heart rate monitoring at home? {YES [id number] Following low-sodium/fluid-restricted diet? {YES NO:22349}  HF Medications: Carvedilol 6.25 mg BID Entresto 49/51 BID Spironolactone 25 mg daily Jardiance 10 mg daily  Has the patient been experiencing any side effects to the medications prescribed?  {YES NO:22349}  Does the patient have any problems obtaining medications due to transportation or finances?   {YES NO:22349}  Understanding of regimen: {excellent/good/fair/poor:19665} Understanding of indications: {excellent/good/fair/poor:19665} Potential of compliance: {excellent/good/fair/poor:19665} Patient understands to avoid NSAIDs. Patient understands to avoid decongestants.    Pertinent Lab Values: 08/10/23 Serum creatinine 0.87, BUN 11, Potassium 3.5, Sodium 141, BNP 1,261.5  Vital Signs: Weight: *** (last clinic weight: 126 lbs) Blood pressure: ***  Heart rate: ***   Assessment/Plan: 1. Chronic systolic HF - Echo 10/21 ([place]) EF 40-45% - Myoview 10/21 EF 36% ? anterior wall defect -> treated medically - CT chest 10/22 + 3v coronary calcium - Echo 10/22 (pre-chemo) EF 45-50% No RWMA  - Echo 11/09/21 EF 50-55%  - Echo 5/23 EF 45-50% - Echo  07/11/22 EF 45-50%  - Echo 10/13/22: EF 40-45% - LHC 11/23 with moderate CAD - Stable NYHA I-II Volume status stable. ReDS 33% - will stop lasix taper given by PCP with addition of SGLT2i - Continue  Entresto 49/51 bid - Continue spiro 25 mg daily - Continue carvedilol at 6.25 mg  bid - Start Jardiance 10 mg daily, check A1c, BMET and BNP today. Plan to repeat BMET 1 week.  - Update echo at f/u   2. Breast Cancer  - Stage IIIB (cT4d, cN1, cM0, G3, ER-, PR-, HER2+)  - Had Echo 09/22/21 EF 45-50% -->Pre chemo - Started on  Docetaxel + Carboplatin + Trastuzumab +  Pertuzumab  (TCHP) q21d  on 09/23/21 - Echo 11/09/21 EF 50-55%  - Echo 5/23 EF 45-50% - Echo  07/11/22 EF 45-50%  - Finished Herceptin/perjeta - Echo 10/13/22: EF 40-45%   3. CAD - cardiac CT with 3v cad - LHC 11/23 with moderate CAD. Calcific CAD with moderate stenosis in the proximal and distal RCA. Plan for medical management.  - denies CP - continue statin and ASA   4. HTN  -Meds as above   5. 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 Gala Romney + echo   F/u [place] at [facility] 11/4  Follow up ***   Karle Plumber, PharmD, BCPS, BCCP, CPP Heart Failure Clinic Pharmacist [phone]

## 2023-09-05 ENCOUNTER — Ambulatory Visit (HOSPITAL_COMMUNITY)
Admission: RE | Admit: 2023-09-05 | Discharge: 2023-09-05 | Disposition: A | Discharge status: Home / Self Care | Payer: Medicare HMO | Source: Ambulatory Visit | Attending: Cardiology | Admitting: Cardiology

## 2023-09-05 VITALS — BP 170/84 | HR 69 | Wt 130.6 lb

## 2023-09-05 DIAGNOSIS — I5022 Chronic systolic (congestive) heart failure: Secondary | ICD-10-CM | POA: Diagnosis not present

## 2023-09-05 MED ORDER — CARVEDILOL 12.5 MG PO TABS: 12.5000 mg | ORAL_TABLET | Freq: Two times a day (BID) | ORAL | 3 refills | Status: AC

## 2023-09-05 MED ORDER — SPIRONOLACTONE 25 MG PO TABS: 25.0000 mg | ORAL_TABLET | Freq: Every day | ORAL | 3 refills | Status: AC

## 2023-09-05 NOTE — Progress Notes (Signed)
Advanced Heart Failure Clinic Note   Primary Care:Dr Osei Bonsu HF Cardiologist: Dr. Gala Romney  Oncology: Dr Pamelia Hoit  HPI:  Lynn Mcgee is a 67 y.o. with history of HTN, smoker, breast cancer, chronic systolic HF.   She was diagnosed with HF in 08/2020 at [facility]. - Echo 08/2020 [facility]) EF 40-45% - Myoview 08/2020 EF 36% ? anterior wall defect -> treated medically    Subsequently found to have breast CA. Followed by Dr Pamelia Hoit - Stage IIIB (cT4d, cN1, cM0, G3, ER-, PR-, HER2+).    Pre-chemo echo 08/2021: EF 45-50% , Grade I DD RV normal    Started Docetaxel + Carboplatin + Trastuzumab + Pertuzumab  (TCHP) q21d on 09/23/21.    Scans on 09/28/21 showed no sign of metastatic disease.    - Echo 11/09/21; EF 45-50% Global HK. Spironolactone was increased to 25 mg daily at this visit. - Echo 07/11/22 EF 45-50%  - Coronary CT 07/2022 with 3v CAD.  1. LAD: findings 0.84, 0.80 0.71 very distal vessel D1: 0.91, 0.79 D2: 0.81 2. LCX: findings 0.77 3.  RCA: findings 0.68, 0.58 0.50    PDA: 0.53   She finished Trastuzumab/Pertuzumab.     -Echo 10/13/22: EF 40-45% - LHC 09/2022 with moderate CAD.    She recently returned to AHF clinic for a follow up on 08/10/23. Overall felt better. At that time, she experienced SOB and fatigue. Her PCP prescribed prednisone taper and 5 days worth of Lasix. Noted some fullness in her belly. Appetite was good. Denied fever and chills. Did not weigh herself at home. Stated that she took all medications. Noted a couple of beers every now and then. Smoked about 1/2 pack /day but trying to cut back. Information was discussed with her daughter over the phone.   Today she returns to HF clinic for pharmacist medication titration. Patient's daughter asked for this visit because "multiple medication changes had been made recently without a clear understanding" and she wanted a medication review with medication education session.   At last visit with APP, Jardiance  10 mg daily was initiated and Lasix was discontinued. Overall feeling good today. Mentions occasionally forgetting to take her medications as prescribed. Main complaint is SOB. Gets SOB after walking down the hallway in clinic. States she can sometimes walk further but SOB has worsened over the last few months. No dizziness or lightheadedness. Home SBP runs around 170 mmHg. In clinic, BP 170/84 mmHg, although she took her medications in the waiting room before clinic visit. No CP/palpitations. Still feels fatigued and attributes this to sleeping too much. She does not weigh herself at home. She is not using a loop diuretic. No LEE, PND or orthopnea. Does note mild abdominal bloating. She did feel that Lasix helped the abdominal bloating when she was using it. Appetite is good. She attempts to follow a low-salt diet but it has been hard. Medication history conducted during clinic visit today. She only brought carvedilol, Entresto and Jardiance to clinic, left other medications at home. It appears she is not taking spironolactone although patient is unsure. Per dispense history, spironolactone was last filled 09/2021. Carvedilol 6.25 mg BID is on her medication list; however, the bottle she has been using is carvedilol 12.5 mg BID and she confirms she has been taking 1 tablet BID. She is taking the correct dose of Entresto, but notes that she forgets to take her medications frequently. She has a pillbox, but just "forgets sometimes".  HF Medications: Carvedilol 6.25 mg BID - taking differently: 12.5 mg BID Entresto 49/51 BID Spironolactone 25 mg daily - likely not taking Jardiance 10 mg daily  Has the patient been experiencing any side effects to the medications prescribed?  No  Does the patient have any problems obtaining medications due to transportation or finances?  No -Has [employer] and [employer]  Understanding of regimen: fair Understanding of indications: fair Potential of  compliance: fair Patient understands to avoid NSAIDs. Patient understands to avoid decongestants.    Pertinent Lab Values: 08/10/23 Serum creatinine 0.87, BUN 11, Potassium 3.5, Sodium 141, BNP 1,261.5  Vital Signs: Weight: 130.6 lbs (last clinic weight: 126 lbs) Blood pressure: 170/84 mmHg - took medications in the waiting room before clinic visit.  Heart rate: 69   Assessment/Plan: 1. Chronic systolic HF - Echo 08/2020 ([place]) EF 40-45% - Myoview 08/2020 EF 36% ? anterior wall defect -> treated medically - CT chest 08/2021 + 3v coronary calcium - Echo 08/2021 (pre-chemo) EF 45-50% No RWMA  - Echo 11/09/21 EF 50-55%  - Echo 03/2022 EF 45-50% - Echo  07/11/22 EF 45-50%  - Echo 10/13/22: EF 40-45% - LHC 09/2022 with moderate CAD - NYHA III, Volume status stable, notes mild abdominal bloating. Taking Entresto, spironolactone and Jardiance consistently will likely help fluid status. Will avoid prescribing PRN Lasix for now to prevent confusion.   - Continue carvedilol 12.5 mg BID, updated medication list to reflect how she is currently taking medication (see above for more details).  - Continue Entresto 49/51 mg BID. Can likely be increased next visit. Will hold off for now given compliance concerns and difficulty managing medications. BP elevated today, but did not take medication until right before appointment.   - Does not appear to be taking spironolactone, will restart spironolactone 25 mg daily - Continue Jardiance 10 mg daily - Reviewed medications in detail and encouraged compliance. She does have a pillbox, but I encouraged her to set a reminder on her phone for her medications. I believe she would benefit from HF paramedicine program. We discussed this today. She would like to think about it. Will call the clinic if decides to enroll in the program.   2. Breast Cancer  - Stage IIIB (cT4d, cN1, cM0, G3, ER-, PR-, HER2+)  - Had Echo 09/22/21 EF 45-50% -->Pre chemo - Started  on  Docetaxel + Carboplatin + Trastuzumab + Pertuzumab  (TCHP) q21d  on 09/23/21 - Echo 11/09/21 EF 50-55%  - Echo 03/2022 EF 45-50% - Echo  07/11/22 EF 45-50%  - Finished Herceptin/perjeta - Echo 10/13/22: EF 40-45%   3. CAD - cardiac CT with 3v cad - LHC 09/2022 with moderate CAD. Calcific CAD with moderate stenosis in the proximal and distal RCA.  - continue atorvastatin 40 mg daily and aspirin 81 mg daily   4. HTN  -BP 170/84 mmHg   5. Tobacco Abuse - Cessation was discussed at 08/10/23 visit. She stated she is trying to cut back.    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 Gala Romney at [facility] on 10/02/23.   Karle Plumber, PharmD, BCPS, BCCP, CPP Heart Failure Clinic Pharmacist [phone]

## 2023-09-05 NOTE — Patient Instructions (Signed)
It was a pleasure seeing you today!  MEDICATIONS: -I have resent prescription for spironolactone 25 mg (1 tablet) daily to your pharmacy. Please start taking it once you pick it up.   - I have updated the prescription of carvedilol in your medical record to reflect that you are taking 12.5 mg (1 tablet) twice daily. This is the strength of the bottle you brought to clinic today.   -Please take your medications consistently every day. This well help with any extra fluid and will also help make your heart as strong as possible. I recommend setting a timer on your phone to remind you. The medications you are taking for heart failure are: carvedilol, Entresto, spironolactone and Jardiance.   -Please bring all your medications and a log of your blood pressures to your next appointment with Dr. Gala Romney.   -Please also consider if you would like to be a part of our Heart Failure Paramedicine program. Our heart failure EMT's are wonderful and can help make sure all your medications are correct. Please call the office if you decide later that you want to enroll [phone]).   -Call if you have questions about your medications.   NEXT APPOINTMENT: Return to clinic in 4 weeks with Dr. Gala Romney.  In general, to take care of your heart failure: -Limit your fluid intake to 2 Liters (half-gallon) per day.   -Limit your salt intake to ideally 2-3 grams (2000-3000 mg) per day. -Weigh yourself daily and record, and bring that "weight diary" to your next appointment.  (Weight gain of 2-3 pounds in 1 day typically means fluid weight.) -The medications for your heart are to help your heart and help you live longer.   -Please contact us before stopping any of your heart medications.  Call the clinic at [phone] with questions or to reschedule future appointments.

## 2023-09-11 DIAGNOSIS — E119 Type 2 diabetes mellitus without complications: Secondary | ICD-10-CM | POA: Diagnosis not present

## 2023-09-11 DIAGNOSIS — E872 Acidosis, unspecified: Secondary | ICD-10-CM | POA: Diagnosis not present

## 2023-09-11 DIAGNOSIS — I110 Hypertensive heart disease with heart failure: Secondary | ICD-10-CM | POA: Diagnosis not present

## 2023-09-11 DIAGNOSIS — R0689 Other abnormalities of breathing: Secondary | ICD-10-CM | POA: Diagnosis not present

## 2023-09-11 DIAGNOSIS — Z853 Personal history of malignant neoplasm of breast: Secondary | ICD-10-CM | POA: Diagnosis not present

## 2023-09-11 DIAGNOSIS — R531 Weakness: Secondary | ICD-10-CM | POA: Diagnosis not present

## 2023-09-11 DIAGNOSIS — I161 Hypertensive emergency: Secondary | ICD-10-CM | POA: Diagnosis not present

## 2023-09-11 DIAGNOSIS — I427 Cardiomyopathy due to drug and external agent: Secondary | ICD-10-CM | POA: Diagnosis not present

## 2023-09-11 DIAGNOSIS — R000 Tachycardia, unspecified: Secondary | ICD-10-CM | POA: Diagnosis not present

## 2023-09-11 DIAGNOSIS — I214 Non-ST elevation (NSTEMI) myocardial infarction: Secondary | ICD-10-CM | POA: Diagnosis not present

## 2023-09-11 DIAGNOSIS — J449 Chronic obstructive pulmonary disease, unspecified: Secondary | ICD-10-CM | POA: Diagnosis not present

## 2023-09-11 DIAGNOSIS — I5023 Acute on chronic systolic (congestive) heart failure: Secondary | ICD-10-CM | POA: Diagnosis not present

## 2023-09-11 DIAGNOSIS — R062 Wheezing: Secondary | ICD-10-CM | POA: Diagnosis not present

## 2023-09-11 DIAGNOSIS — I10 Essential (primary) hypertension: Secondary | ICD-10-CM | POA: Diagnosis not present

## 2023-09-11 DIAGNOSIS — J9601 Acute respiratory failure with hypoxia: Secondary | ICD-10-CM | POA: Diagnosis not present

## 2023-09-11 DIAGNOSIS — R079 Chest pain, unspecified: Secondary | ICD-10-CM | POA: Diagnosis not present

## 2023-09-11 DIAGNOSIS — I517 Cardiomegaly: Secondary | ICD-10-CM | POA: Diagnosis not present

## 2023-09-11 DIAGNOSIS — I700 Atherosclerosis of aorta: Secondary | ICD-10-CM | POA: Diagnosis not present

## 2023-09-12 DIAGNOSIS — J9601 Acute respiratory failure with hypoxia: Secondary | ICD-10-CM | POA: Diagnosis not present

## 2023-09-12 DIAGNOSIS — I272 Pulmonary hypertension, unspecified: Secondary | ICD-10-CM | POA: Diagnosis not present

## 2023-09-12 DIAGNOSIS — R0602 Shortness of breath: Secondary | ICD-10-CM | POA: Diagnosis not present

## 2023-09-12 DIAGNOSIS — I083 Combined rheumatic disorders of mitral, aortic and tricuspid valves: Secondary | ICD-10-CM | POA: Diagnosis not present

## 2023-09-13 DIAGNOSIS — R0603 Acute respiratory distress: Secondary | ICD-10-CM | POA: Diagnosis not present

## 2023-09-13 DIAGNOSIS — I700 Atherosclerosis of aorta: Secondary | ICD-10-CM | POA: Diagnosis not present

## 2023-09-28 DIAGNOSIS — M1711 Unilateral primary osteoarthritis, right knee: Secondary | ICD-10-CM | POA: Diagnosis not present

## 2023-09-28 DIAGNOSIS — G8929 Other chronic pain: Secondary | ICD-10-CM | POA: Diagnosis not present

## 2023-09-28 DIAGNOSIS — M25561 Pain in right knee: Secondary | ICD-10-CM | POA: Diagnosis not present

## 2023-10-02 ENCOUNTER — Encounter: Payer: Medicare HMO | Admitting: Internal Medicine

## 2023-10-24 DIAGNOSIS — J22 Unspecified acute lower respiratory infection: Secondary | ICD-10-CM | POA: Diagnosis not present

## 2023-11-02 DIAGNOSIS — J209 Acute bronchitis, unspecified: Secondary | ICD-10-CM | POA: Diagnosis not present

## 2023-11-03 ENCOUNTER — Other Ambulatory Visit (HOSPITAL_COMMUNITY): Payer: Self-pay | Admitting: Internal Medicine

## 2023-11-27 ENCOUNTER — Telehealth: Payer: Self-pay | Admitting: Hematology and Oncology

## 2023-11-27 NOTE — Telephone Encounter (Signed)
Unable to leave a voicemail regarding appointment changes due to voicemail being full

## 2024-01-02 ENCOUNTER — Encounter (HOSPITAL_COMMUNITY): Payer: Self-pay | Admitting: Internal Medicine

## 2024-01-02 ENCOUNTER — Ambulatory Visit (HOSPITAL_COMMUNITY)
Admission: RE | Admit: 2024-01-02 | Discharge: 2024-01-02 | Disposition: A | Discharge status: Home / Self Care | Payer: Medicare HMO | Source: Ambulatory Visit | Attending: Internal Medicine

## 2024-01-02 ENCOUNTER — Ambulatory Visit (HOSPITAL_BASED_OUTPATIENT_CLINIC_OR_DEPARTMENT_OTHER)
Admission: RE | Admit: 2024-01-02 | Discharge: 2024-01-02 | Disposition: A | Discharge status: Home / Self Care | Payer: Medicare HMO | Source: Ambulatory Visit | Attending: Internal Medicine | Admitting: Internal Medicine

## 2024-01-02 VITALS — BP 140/90 | HR 65 | Wt 132.8 lb

## 2024-01-02 DIAGNOSIS — I110 Hypertensive heart disease with heart failure: Secondary | ICD-10-CM | POA: Insufficient documentation

## 2024-01-02 DIAGNOSIS — Z853 Personal history of malignant neoplasm of breast: Secondary | ICD-10-CM | POA: Diagnosis not present

## 2024-01-02 DIAGNOSIS — F17210 Nicotine dependence, cigarettes, uncomplicated: Secondary | ICD-10-CM | POA: Diagnosis not present

## 2024-01-02 DIAGNOSIS — I2510 Atherosclerotic heart disease of native coronary artery without angina pectoris: Secondary | ICD-10-CM

## 2024-01-02 DIAGNOSIS — Q2543 Congenital aneurysm of aorta: Secondary | ICD-10-CM | POA: Diagnosis not present

## 2024-01-02 DIAGNOSIS — I5022 Chronic systolic (congestive) heart failure: Secondary | ICD-10-CM | POA: Insufficient documentation

## 2024-01-02 DIAGNOSIS — Z720 Tobacco use: Secondary | ICD-10-CM | POA: Diagnosis not present

## 2024-01-02 DIAGNOSIS — I080 Rheumatic disorders of both mitral and aortic valves: Secondary | ICD-10-CM | POA: Insufficient documentation

## 2024-01-02 DIAGNOSIS — I10 Essential (primary) hypertension: Secondary | ICD-10-CM

## 2024-01-02 LAB — ECHOCARDIOGRAM COMPLETE
AR max vel: 2.62 cm2
AV Area VTI: 2.23 cm2
AV Area mean vel: 2.16 cm2
AV Mean grad: 9.6 mm[Hg]
AV Peak grad: 12.6 mm[Hg]
Ao pk vel: 1.77 m/s
Area-P 1/2: 2.82 cm2
P 1/2 time: 700 ms
S' Lateral: 3.3 cm
Single Plane A4C EF: 49.9 %

## 2024-01-02 MED ORDER — ENTRESTO 97-103 MG PO TABS: 1.0000 | ORAL_TABLET | Freq: Two times a day (BID) | ORAL | 6 refills | Status: AC

## 2024-01-02 NOTE — Patient Instructions (Signed)
 Great to see you today!!!  Increase Entresto  to 97/103 mg Twice daily   Your physician discussed the hazards of tobacco use. Tobacco use cessation is recommended and techniques and options to help you quit were discussed.  Your physician recommends that you schedule a follow-up appointment in: 6 months with a repeat CT scan (August), we will call you closer to this time to schedule  If you have any questions or concerns before your next appointment please send us  a message through [place] or call our office at [phone].    TO LEAVE A MESSAGE FOR THE NURSE SELECT OPTION 2, PLEASE LEAVE A MESSAGE INCLUDING: YOUR NAME DATE OF BIRTH CALL BACK NUMBER REASON FOR CALL**this is important as we prioritize the call backs  YOU WILL RECEIVE A CALL BACK THE SAME DAY AS LONG AS YOU CALL BEFORE 4:00 PM At the [facility], you and your health needs are our priority. As part of our continuing mission to provide you with exceptional heart care, we have created designated Provider Care Teams. These Care Teams include your primary Cardiologist (physician) and Advanced Practice Providers (APPs- Physician Assistants and Nurse Practitioners) who all work together to provide you with the care you need, when you need it.   You may see any of the following providers on your designated Care Team at your next follow up: Dr Toribio Fuel Dr Ezra Shuck Dr. Ria Commander Dr. Morene Brownie Amy Lenetta, NP Caffie Shed, [place] [facility] [place], [place] Beckey Coe, NP Jordan Lee, NP Tinnie Redman, PharmD   Please be sure to bring in all your medications bottles to every appointment.    Thank you for choosing [facility]

## 2024-01-02 NOTE — Progress Notes (Signed)
 [facility]  NOTE  Referring Physician: Dr Odean  Primary Care:Dr Osei Bonsu Primary Cardiologist: none  Oncology: Dr Odean.   HPI: Ms Hickle is a 68 y.o. with history of HTN, smoker, breast cancer, chronic systolic HF  Diagnosed with HF in 10/21 at [facility]. - Echo 10/21 ([place]) EF 40-45% - Myoview 10/21 EF 36% ? anterior wall defect -> treated medically   Subsequently found to have breast CA. Followed by Dr Odean - Stage IIIB (cT4d, cN1, cM0, G3, ER-, PR-, HER2+).   Pre-chemo echo 10/22: EF 45-50% , Grade I DD RV normal   Started Docetaxel  + Carboplatin  + Trastuzumab  + Pertuzumab   (TCHP) q21d on 09/23/21.   Scans on 09/28/21 no sign of metastatic disease.   - Echo 11/09/21; EF 45-50% Global HK. Spiro was increased to 25 mg daily  - Echo 07/11/22 EF 45-50%  - Coronary CT 9/23 with 3v CAD.  1. LAD: findings 0.84, 0.80 0.71 very distal vessel D1: 0.91, 0.79 D2: 0.81 2. LCX: findings 0.77 3.  RCA: findings 0.68, 0.58 0.50    PDA: 0.53  Has finished Herceptin /Perjeta .    -Echo 10/13/22: EF 40-45% - LHC 11/23 with moderate CAD. Plan for medical management.  CTA Heart  1. Coronary calcium score of 2307. This was 28 percentile for age and sex matched control. 2. Diffuse multivessel mixed plaque with moderate stenosis. Sending for FFR analysis. CT today appears unchanged from recent heart cath 10/26/22. 3. Mildly dilated ascending aorta 44 mm (prior 42 mm 07/2022).  Today she returns for HF f/u. Says she feels good. Was in [facility] ER several months ago with COPD/CHF flare. No CP. No edema, orthopnea or PND. Taking meds as scheduled. No low BPs.  Still smoking 1/2 ppd  Past Medical History:  Diagnosis Date   Cancer [facility])    right breast cancer   CHF (congestive heart failure) (HCC)    per patient   Heart murmur    pt states MD stated she had heart murmur years ago   History of radiation therapy    Right chest wall 04/06/22-05/18/22- Dr. Lynwood Nasuti    Hypertension    Current Outpatient Medications  Medication Sig Dispense Refill   acetaminophen  (TYLENOL ) 500 MG tablet Take 1,000 mg by mouth every 6 (six) hours as needed for mild pain.     aspirin  EC 81 MG tablet Take 1 tablet (81 mg total) by mouth daily. Swallow whole. 90 tablet 3   atorvastatin (LIPITOR) 40 MG tablet Take 40 mg by mouth daily.     carvedilol  (COREG ) 12.5 MG tablet Take 1 tablet (12.5 mg total) by mouth 2 (two) times daily. 180 tablet 3   empagliflozin  (JARDIANCE ) 10 MG TABS tablet Take 1 tablet (10 mg total) by mouth daily before breakfast. 90 tablet 3   hydrOXYzine  (ATARAX ) 25 MG tablet Take 25 mg by mouth every 8 (eight) hours as needed for anxiety.     potassium chloride  (KLOR-CON ) 20 MEQ packet Take 20 mEq by mouth daily. 30 packet 1   sacubitril-valsartan (ENTRESTO ) 49-51 MG Take 1 tablet by mouth twice daily 60 tablet 3   spironolactone  (ALDACTONE ) 25 MG tablet Take 1 tablet (25 mg total) by mouth daily. 90 tablet 3   albuterol (VENTOLIN HFA) 108 (90 Base) MCG/ACT inhaler Inhale into the lungs. (Patient not taking: Reported on 01/02/2024)     No current facility-administered medications for this encounter.   No Known Allergies  Social History  Socioeconomic History   Marital status: Single    Spouse name: Not on file   Number of children: Not on file   Years of education: Not on file   Highest education level: Not on file  Occupational History   Not on file  Tobacco Use   Smoking status: Every Day    Current packs/day: 0.50    Average packs/day: 0.5 packs/day for 35.0 years (17.5 ttl pk-yrs)    Types: Cigarettes   Smokeless tobacco: Never  Vaping Use   Vaping status: Never Used  Substance and Sexual Activity   Alcohol use: Not Currently    Comment: way back   Drug use: Yes    Types: Marijuana    Comment: occasionally every couple of months   Sexual activity: Not on file  Other Topics Concern   Not on file  Social History Narrative   Not  on file   Social Drivers of Health   Financial Resource Strain: Not on file  Food Insecurity: Not on file  Transportation Needs: Not on file  Physical Activity: Not on file  Stress: Not on file  Social Connections: Not on file  Intimate Partner Violence: Not on file   Vitals:   01/02/24 1512  BP: (!) 140/90  Pulse: 65  SpO2: 98%  Weight: 60.2 kg (132 lb 12.8 oz)   PHYSICAL EXAM: General:  Elderly thin No resp difficulty HEENT: normal Neck: supple. no JVD. Carotids 2+ bilat; no bruits. No lymphadenopathy or thryomegaly appreciated. Cor: PMI nondisplaced. 2/6 SEM RUSB  Lungs: coarse Abdomen: soft, nontender, nondistended. No hepatosplenomegaly. No bruits or masses. Good bowel sounds. Extremities: no cyanosis, clubbing, rash, edema Neuro: alert & orientedx3, cranial nerves grossly intact. moves all 4 extremities w/o difficulty. Affect pleasant  Wt Readings from Last 3 Encounters:  01/02/24 60.2 kg (132 lb 12.8 oz)  09/05/23 59.2 kg (130 lb 9.6 oz)  08/10/23 57.5 kg (126 lb 12.8 oz)     ASSESSMENT & PLAN:  1. Chronic systolic HF - Echo 10/21 ([place]) EF 40-45% - Myoview 10/21 EF 36% ? anterior wall defect -> treated medically - CT chest 10/22 + 3v coronary calcium - Echo 10/22 (pre-chemo) EF 45-50% No RWMA  - Echo 11/09/21 EF 50-55%  - Echo 5/23 EF 45-50% - Echo  07/11/22 EF 45-50%  - Echo 10/13/22: EF 40-45% - LHC 11/23 with moderate CAD - Echo today 01/02/24 EF 55-60% Personally reviewed - Volume ok - Stable NYHA II due mostly to COPD - With high BP increase Entresto  to 97/103 bid - Continue spiro 25 mg daily - Continue carvedilol  at 6.25 mg  bid - Continue Jardiance    2. Breast Cancer  - Stage IIIB (cT4d, cN1, cM0, G3, ER-, PR-, HER2+)  - Had Echo 09/22/21 EF 45-50% -->Pre chemo - Started on  Docetaxel  + Carboplatin  + Trastuzumab  + Pertuzumab   (TCHP) q21d  on 09/23/21 - Echo 11/09/21 EF 50-55%  - Echo 5/23 EF 45-50% - Echo  07/11/22 EF 45-50%  - Finished  Herceptin /perjeta  - Echo 10/13/22: EF 40-45% - Echo today 01/02/24 EF 55-60%  3. CAD - cardiac CT with 3v cad - LHC 11/23 with moderate CAD. Calcific CAD with moderate stenosis in the proximal and distal RCA. Plan for medical management.  - No s/s angina. Continue medical Rx - continue statin and ASA  4. HTN  -Increase Entresto   5. 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

## 2024-01-15 ENCOUNTER — Ambulatory Visit: Payer: Medicare HMO | Admitting: Hematology and Oncology

## 2024-01-15 DIAGNOSIS — Z720 Tobacco use: Secondary | ICD-10-CM | POA: Diagnosis not present

## 2024-01-15 DIAGNOSIS — Z0001 Encounter for general adult medical examination with abnormal findings: Secondary | ICD-10-CM | POA: Diagnosis not present

## 2024-01-15 DIAGNOSIS — F419 Anxiety disorder, unspecified: Secondary | ICD-10-CM | POA: Diagnosis not present

## 2024-01-15 DIAGNOSIS — J209 Acute bronchitis, unspecified: Secondary | ICD-10-CM | POA: Diagnosis not present

## 2024-01-15 DIAGNOSIS — I10 Essential (primary) hypertension: Secondary | ICD-10-CM | POA: Diagnosis not present

## 2024-01-15 DIAGNOSIS — E559 Vitamin D deficiency, unspecified: Secondary | ICD-10-CM | POA: Diagnosis not present

## 2024-01-15 DIAGNOSIS — E782 Mixed hyperlipidemia: Secondary | ICD-10-CM | POA: Diagnosis not present

## 2024-01-15 NOTE — Assessment & Plan Note (Deleted)
 09/10/2021 right breast thickening and swelling: Mammogram revealed extremely hard right breast with peau d'orange skin changes, ultrasound revealed very large irregular mass measuring 11 cm with diffuse skin thickening, 3 abnormal right axillary lymph nodes, biopsy revealed grade 3 invasive mammary cancer with pleomorphic features, lymph node positive, ER 0%, PR 0%, HER2 3+, Ki-67 30%   Treatment plan: 1. Neoadjuvant chemotherapy with TCHP, Herceptin Perjeta completed 09/22/2022 2. 02/22/2022:Bilateral mastectomies: Pathologic complete response, 6 lymph nodes negative 3. Followed by adjuvant radiation therapy 04/07/2022-05/18/2022 ------------------------------------------------------------------------------------------------- Current treatment: Surveillance   Breast cancer surveillance: Breast exam 01/16/24: Benign No role of mammograms since she had bilateral mastectomies   I sent a message to Dr. Magnus Ivan to remove her port and also to discuss hernia surgery. I requested Dr. Leta Baptist to see the patient for consultation for bilateral breast reconstructions.   Return to clinic in 1 year for follow-up

## 2024-01-16 ENCOUNTER — Telehealth: Payer: Self-pay | Admitting: Hematology and Oncology

## 2024-01-16 ENCOUNTER — Inpatient Hospital Stay: Payer: Medicare HMO | Admitting: Hematology and Oncology

## 2024-01-16 DIAGNOSIS — C50411 Malignant neoplasm of upper-outer quadrant of right female breast: Secondary | ICD-10-CM

## 2024-01-16 NOTE — Telephone Encounter (Signed)
 Rescheduled appointment per patients request via incoming call. Patient is aware of the changes made to her upcoming appointment.

## 2024-01-25 ENCOUNTER — Inpatient Hospital Stay: Payer: Medicare HMO | Attending: Hematology and Oncology | Admitting: Hematology and Oncology

## 2024-01-25 ENCOUNTER — Encounter: Payer: Self-pay | Admitting: Hematology and Oncology

## 2024-01-25 VITALS — BP 183/104 | HR 68 | Temp 98.0°F | Resp 19 | Ht 65.0 in | Wt 133.7 lb

## 2024-01-25 DIAGNOSIS — Z9013 Acquired absence of bilateral breasts and nipples: Secondary | ICD-10-CM | POA: Diagnosis not present

## 2024-01-25 DIAGNOSIS — Z923 Personal history of irradiation: Secondary | ICD-10-CM | POA: Diagnosis not present

## 2024-01-25 DIAGNOSIS — Z79899 Other long term (current) drug therapy: Secondary | ICD-10-CM | POA: Diagnosis not present

## 2024-01-25 DIAGNOSIS — Z1732 Human epidermal growth factor receptor 2 negative status: Secondary | ICD-10-CM | POA: Diagnosis not present

## 2024-01-25 DIAGNOSIS — Z171 Estrogen receptor negative status [ER-]: Secondary | ICD-10-CM | POA: Diagnosis not present

## 2024-01-25 DIAGNOSIS — C50411 Malignant neoplasm of upper-outer quadrant of right female breast: Secondary | ICD-10-CM | POA: Insufficient documentation

## 2024-01-25 DIAGNOSIS — Z1722 Progesterone receptor negative status: Secondary | ICD-10-CM | POA: Diagnosis not present

## 2024-01-25 NOTE — Progress Notes (Signed)
 Patient Care Team: Jackie Plum, MD as PCP - General (Internal Medicine) Serena Croissant, MD as Consulting Physician (Hematology and Oncology) Antony Blackbird, MD as Consulting Physician (Radiation Oncology) Abigail Miyamoto, MD as Consulting Physician (General Surgery)  DIAGNOSIS:  Encounter Diagnosis  Name Primary?   Malignant neoplasm of upper-outer quadrant of right breast in female, estrogen receptor negative (HCC) Yes    SUMMARY OF ONCOLOGIC HISTORY: Oncology History  Malignant neoplasm of upper-outer quadrant of right breast in female, estrogen receptor negative (HCC)  09/10/2021 Initial Diagnosis   Right breast thickening and swelling: Mammogram revealed extremely hard right breast with peau d'orange skin changes, ultrasound revealed very large irregular mass measuring 11 cm with diffuse skin thickening, 3 abnormal right axillary lymph nodes, biopsy revealed grade 3 invasive mammary cancer with pleomorphic features, lymph node positive, ER 0%, PR 0%, HER2 3+, Ki-67 30%   09/16/2021 Cancer Staging   Staging form: Breast, AJCC 8th Edition - Clinical stage from 09/16/2021: Stage IIIB (cT4d, cN1, cM0, G3, ER-, PR-, HER2+) - Signed by Serena Croissant, MD on 09/16/2021 Stage prefix: Initial diagnosis Histologic grading system: 3 grade system   09/24/2021 - 01/17/2022 Chemotherapy   Patient is on Treatment Plan : BREAST  Docetaxel + Carboplatin + Trastuzumab + Pertuzumab  (TCHP) q21d      02/04/2022 - 09/22/2022 Chemotherapy   Patient is on Treatment Plan : BREAST Trastuzumab + Pertuzumab q21d     02/22/2022 Surgery   Bilateral mastectomies: Pathologic complete response, 6 lymph nodes negative   04/06/2022 - 05/18/2022 Radiation Therapy    04/06/2022 through 05/18/2022 Site Technique Total Dose (Gy) Dose per Fx (Gy) Completed Fx Beam Energies  Chest Wall, Right: CW_R 3D 50/50 2 25/25 6XFFF  Chest Wall, Right: CW_R_Bst Electron 10/10 2 5/5 6E  Sclav-RT: SCV_R 3D 50/50 2 25/25 6X,  10X       CHIEF COMPLIANT: Surveillance of breast cancer  HISTORY OF PRESENT ILLNESS: Lynn Mcgee is a 68 year old female with a history of malignant neoplasm of the right breast who presents for a routine follow-up visit.  She has a history of malignant neoplasm in the right breast, specifically in the upper outer quadrant, which was estrogen receptor positive. A screening mammogram previously detected a mass in the right breast at the eleven o'clock position, measuring 1.4 centimeters. The mass was XLR negative, and a biopsy revealed grade one invasive ductal carcinoma (IDC) with estrogen receptor (ER) positivity at 95%, progesterone receptor (PR) positivity at 99%, and HER2 negative at 1+. She has been cancer-free for two and a half years following bilateral mastectomies.  She experiences itching in the breast area, which she manages with moisturizers, although she does not use them consistently. No pain, discomfort, or other concerns related to her breasts.  She has been experiencing a toothache due to an infection and has been on amoxicillin for about a week. She notes that this may have caused her blood pressure to rise slightly. The toothache has been persistent, and she has been in pain for the past week.  She recently visited her cardiologist, who reported that her heart health is good and scheduled her next appointment in six months. She also saw her primary doctor for a cold, who indicated that she was okay and plans to take blood tests at her next appointment on March 7th.   ALLERGIES:  has no known allergies.  MEDICATIONS:  Current Outpatient Medications  Medication Sig Dispense Refill   acetaminophen (TYLENOL) 500 MG tablet Take  1,000 mg by mouth every 6 (six) hours as needed for mild pain.     albuterol (VENTOLIN HFA) 108 (90 Base) MCG/ACT inhaler Inhale into the lungs. (Patient not taking: Reported on 01/02/2024)     aspirin EC 81 MG tablet Take 1 tablet (81 mg total) by mouth  daily. Swallow whole. 90 tablet 3   atorvastatin (LIPITOR) 40 MG tablet Take 40 mg by mouth daily.     carvedilol (COREG) 12.5 MG tablet Take 1 tablet (12.5 mg total) by mouth 2 (two) times daily. 180 tablet 3   empagliflozin (JARDIANCE) 10 MG TABS tablet Take 1 tablet (10 mg total) by mouth daily before breakfast. 90 tablet 3   hydrOXYzine (ATARAX) 25 MG tablet Take 25 mg by mouth every 8 (eight) hours as needed for anxiety.     potassium chloride (KLOR-CON) 20 MEQ packet Take 20 mEq by mouth daily. 30 packet 1   sacubitril-valsartan (ENTRESTO) 97-103 MG Take 1 tablet by mouth 2 (two) times daily. 60 tablet 6   spironolactone (ALDACTONE) 25 MG tablet Take 1 tablet (25 mg total) by mouth daily. 90 tablet 3   No current facility-administered medications for this visit.    PHYSICAL EXAMINATION: ECOG PERFORMANCE STATUS: 1 - Symptomatic but completely ambulatory  Vitals:   01/25/24 1255 01/25/24 1257  BP: (!) 186/90 (!) 183/104  Pulse: 68   Resp: 19   Temp: 98 F (36.7 C)   SpO2: 99%    Filed Weights   01/25/24 1255  Weight: 133 lb 11.2 oz (60.6 kg)    Physical Exam No palpable lumps or nodules in bilateral chest wall or axilla  (exam performed in the presence of a chaperone)  LABORATORY DATA:  I have reviewed the data as listed    Latest Ref Rng & Units 08/10/2023    3:54 PM 02/28/2023    3:00 PM 10/26/2022    9:33 AM  CMP  Glucose 70 - 99 mg/dL 93  403  92   BUN 8 - 23 mg/dL 11  13  13    Creatinine 0.44 - 1.00 mg/dL 4.74  2.59  5.63   Sodium 135 - 145 mmol/L 141  139  140   Potassium 3.5 - 5.1 mmol/L 3.5  3.6  4.1   Chloride 98 - 111 mmol/L 105  105  109   CO2 22 - 32 mmol/L 22  23  23    Calcium 8.9 - 10.3 mg/dL 8.9  9.2  9.1     Lab Results  Component Value Date   WBC 4.5 10/26/2022   HGB 12.8 10/26/2022   HCT 40.1 10/26/2022   MCV 77.1 (L) 10/26/2022   PLT 342 10/26/2022   NEUTROABS 1.3 (L) 09/01/2022    ASSESSMENT & PLAN:  Malignant neoplasm of upper-outer  quadrant of right breast in female, estrogen receptor negative (HCC) 09/10/2021 right breast thickening and swelling: Mammogram revealed extremely hard right breast with peau d'orange skin changes, ultrasound revealed very large irregular mass measuring 11 cm with diffuse skin thickening, 3 abnormal right axillary lymph nodes, biopsy revealed grade 3 invasive mammary cancer with pleomorphic features, lymph node positive, ER 0%, PR 0%, HER2 3+, Ki-67 30%   Treatment plan: 1. Neoadjuvant chemotherapy with TCHP, Herceptin Perjeta completed 09/22/2022 2. 02/22/2022:Bilateral mastectomies: Pathologic complete response, 6 lymph nodes negative 3. Followed by adjuvant radiation therapy 04/07/2022-05/18/2022 ------------------------------------------------------------------------------------------------- Current treatment: Surveillance   Breast cancer surveillance: Breast exam 01/25/2024: Benign No role of mammograms since she had bilateral mastectomies  [facility]  has been removed and ventral hernia repaired    Return to clinic in 1 year for follow-up    No orders of the defined types were placed in this encounter.  The patient has a good understanding of the overall plan. she agrees with it. she will call with any problems that may develop before the next visit here. Total time spent: 30 mins including face to face time and time spent for planning, charting and co-ordination of care   Tamsen Meek, MD 01/25/24

## 2024-01-25 NOTE — Assessment & Plan Note (Signed)
 09/10/2021 right breast thickening and swelling: Mammogram revealed extremely hard right breast with peau d'orange skin changes, ultrasound revealed very large irregular mass measuring 11 cm with diffuse skin thickening, 3 abnormal right axillary lymph nodes, biopsy revealed grade 3 invasive mammary cancer with pleomorphic features, lymph node positive, ER 0%, PR 0%, HER2 3+, Ki-67 30%   Treatment plan: 1. Neoadjuvant chemotherapy with TCHP, Herceptin Perjeta completed 09/22/2022 2. 02/22/2022:Bilateral mastectomies: Pathologic complete response, 6 lymph nodes negative 3. Followed by adjuvant radiation therapy 04/07/2022-05/18/2022 ------------------------------------------------------------------------------------------------- Current treatment: Surveillance   Breast cancer surveillance: Breast exam 01/25/2024: Benign No role of mammograms since she had bilateral mastectomies  Port has been removed and ventral hernia repaired   I requested Dr. Leta Baptist to see the patient for consultation for bilateral breast reconstructions.   Return to clinic in 1 year for follow-up

## 2024-02-02 ENCOUNTER — Telehealth (HOSPITAL_COMMUNITY): Payer: Self-pay | Admitting: *Deleted

## 2024-02-02 NOTE — Telephone Encounter (Signed)
 Received Medical Clearance of Dental Treatment from [facility], form completed and signed by Dr Gala Romney, "no antibiotics, ok to hold ASA 5 days prior as needed, no anesthetic restrictions, epi ok"  Form faxed back to [facility] at [phone]

## 2024-04-11 DIAGNOSIS — E782 Mixed hyperlipidemia: Secondary | ICD-10-CM | POA: Diagnosis not present

## 2024-04-11 DIAGNOSIS — Z131 Encounter for screening for diabetes mellitus: Secondary | ICD-10-CM | POA: Diagnosis not present

## 2024-04-11 DIAGNOSIS — E559 Vitamin D deficiency, unspecified: Secondary | ICD-10-CM | POA: Diagnosis not present

## 2024-04-11 DIAGNOSIS — F419 Anxiety disorder, unspecified: Secondary | ICD-10-CM | POA: Diagnosis not present

## 2024-04-11 DIAGNOSIS — Z0001 Encounter for general adult medical examination with abnormal findings: Secondary | ICD-10-CM | POA: Diagnosis not present

## 2024-04-11 DIAGNOSIS — I10 Essential (primary) hypertension: Secondary | ICD-10-CM | POA: Diagnosis not present

## 2024-04-11 DIAGNOSIS — J209 Acute bronchitis, unspecified: Secondary | ICD-10-CM | POA: Diagnosis not present

## 2024-04-11 DIAGNOSIS — Z720 Tobacco use: Secondary | ICD-10-CM | POA: Diagnosis not present

## 2024-05-02 DIAGNOSIS — E559 Vitamin D deficiency, unspecified: Secondary | ICD-10-CM | POA: Diagnosis not present

## 2024-05-02 DIAGNOSIS — I10 Essential (primary) hypertension: Secondary | ICD-10-CM | POA: Diagnosis not present

## 2024-05-02 DIAGNOSIS — E782 Mixed hyperlipidemia: Secondary | ICD-10-CM | POA: Diagnosis not present

## 2024-05-02 DIAGNOSIS — Z720 Tobacco use: Secondary | ICD-10-CM | POA: Diagnosis not present

## 2024-05-02 DIAGNOSIS — F419 Anxiety disorder, unspecified: Secondary | ICD-10-CM | POA: Diagnosis not present

## 2024-07-18 ENCOUNTER — Ambulatory Visit (HOSPITAL_COMMUNITY)
Admission: RE | Admit: 2024-07-18 | Discharge: 2024-07-18 | Disposition: A | Discharge status: Home / Self Care | Source: Ambulatory Visit | Attending: Internal Medicine | Admitting: Internal Medicine

## 2024-07-18 DIAGNOSIS — Q2543 Congenital aneurysm of aorta: Secondary | ICD-10-CM | POA: Insufficient documentation

## 2024-07-18 DIAGNOSIS — I2510 Atherosclerotic heart disease of native coronary artery without angina pectoris: Secondary | ICD-10-CM | POA: Diagnosis not present

## 2024-07-18 DIAGNOSIS — Z8679 Personal history of other diseases of the circulatory system: Secondary | ICD-10-CM | POA: Diagnosis not present

## 2024-07-18 DIAGNOSIS — I7121 Aneurysm of the ascending aorta, without rupture: Secondary | ICD-10-CM | POA: Diagnosis not present

## 2024-07-18 DIAGNOSIS — R079 Chest pain, unspecified: Secondary | ICD-10-CM | POA: Diagnosis not present

## 2024-07-18 MED ORDER — SODIUM CHLORIDE (PF) 0.9 % IJ SOLN
INTRAMUSCULAR | Status: AC
  Filled 2024-07-18: qty 50

## 2024-07-18 MED ORDER — IOHEXOL 350 MG/ML SOLN
100.0000 mL | Freq: Once | INTRAVENOUS | Status: AC | PRN
  Administered 2024-07-18: 100 mL via INTRAVENOUS

## 2024-08-11 ENCOUNTER — Ambulatory Visit (HOSPITAL_COMMUNITY): Payer: Self-pay | Admitting: Internal Medicine

## 2024-08-14 ENCOUNTER — Telehealth (HOSPITAL_COMMUNITY): Payer: Self-pay | Admitting: *Deleted

## 2024-08-14 NOTE — Telephone Encounter (Signed)
 Called patient per Dr. Cherrie with following CT results:  Ascending aortic aneurysm is stable. 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 Please get renal u/s and refer to [facility] and Nephrology  Pt requested clinic visit with Dr. Cherrie to discuss above findings and need time to think about it as well. Clinic visit scheduled with Dr. Bensimhon. Asked patient to call us  at [phone] if any questions or concerns prior to that visit.

## 2024-09-09 ENCOUNTER — Ambulatory Visit (HOSPITAL_COMMUNITY)
Admission: RE | Admit: 2024-09-09 | Discharge: 2024-09-09 | Disposition: A | Discharge status: Home / Self Care | Source: Ambulatory Visit | Attending: Internal Medicine | Admitting: Internal Medicine

## 2024-09-09 ENCOUNTER — Encounter (HOSPITAL_COMMUNITY): Payer: Self-pay | Admitting: Internal Medicine

## 2024-09-09 VITALS — BP 168/80 | HR 71 | Wt 129.0 lb

## 2024-09-09 DIAGNOSIS — Z9221 Personal history of antineoplastic chemotherapy: Secondary | ICD-10-CM | POA: Insufficient documentation

## 2024-09-09 DIAGNOSIS — I701 Atherosclerosis of renal artery: Secondary | ICD-10-CM

## 2024-09-09 DIAGNOSIS — Q2543 Congenital aneurysm of aorta: Secondary | ICD-10-CM | POA: Diagnosis not present

## 2024-09-09 DIAGNOSIS — I2510 Atherosclerotic heart disease of native coronary artery without angina pectoris: Secondary | ICD-10-CM | POA: Diagnosis not present

## 2024-09-09 DIAGNOSIS — I110 Hypertensive heart disease with heart failure: Secondary | ICD-10-CM | POA: Insufficient documentation

## 2024-09-09 DIAGNOSIS — I5022 Chronic systolic (congestive) heart failure: Secondary | ICD-10-CM

## 2024-09-09 DIAGNOSIS — F17210 Nicotine dependence, cigarettes, uncomplicated: Secondary | ICD-10-CM | POA: Insufficient documentation

## 2024-09-09 DIAGNOSIS — I10 Essential (primary) hypertension: Secondary | ICD-10-CM

## 2024-09-09 DIAGNOSIS — Z720 Tobacco use: Secondary | ICD-10-CM

## 2024-09-09 DIAGNOSIS — R9431 Abnormal electrocardiogram [ECG] [EKG]: Secondary | ICD-10-CM | POA: Diagnosis not present

## 2024-09-09 DIAGNOSIS — Z853 Personal history of malignant neoplasm of breast: Secondary | ICD-10-CM | POA: Diagnosis not present

## 2024-09-09 DIAGNOSIS — Z7984 Long term (current) use of oral hypoglycemic drugs: Secondary | ICD-10-CM | POA: Insufficient documentation

## 2024-09-09 DIAGNOSIS — N261 Atrophy of kidney (terminal): Secondary | ICD-10-CM | POA: Diagnosis not present

## 2024-09-09 DIAGNOSIS — R931 Abnormal findings on diagnostic imaging of heart and coronary circulation: Secondary | ICD-10-CM | POA: Diagnosis not present

## 2024-09-09 DIAGNOSIS — Z7982 Long term (current) use of aspirin: Secondary | ICD-10-CM | POA: Diagnosis not present

## 2024-09-09 MED ORDER — AMLODIPINE BESYLATE 5 MG PO TABS
5.0000 mg | ORAL_TABLET | Freq: Every day | ORAL | 3 refills | Status: AC
Start: 2024-09-09 — End: 2024-12-08

## 2024-09-09 NOTE — Progress Notes (Signed)
 [facility]  NOTE  Referring Physician: Dr Odean  Primary Care:Dr Osei Bonsu Primary Cardiologist: none  Oncology: Dr Odean.   HPI: Lynn Mcgee is a 68 y.o. with history of HTN, smoker, breast cancer, chronic systolic HF  Diagnosed with HF in 10/21 at [facility]. - Echo 10/21 ([place]) EF 40-45% - Myoview 10/21 EF 36% ? anterior wall defect -> treated medically   Subsequently found to have breast CA. Followed by Dr Odean - Stage IIIB (cT4d, cN1, cM0, G3, ER-, PR-, HER2+).   Pre-chemo echo 10/22: EF 45-50% , Grade I DD RV normal   Started Docetaxel  + Carboplatin  + Trastuzumab  + Pertuzumab   (TCHP) q21d on 09/23/21.   Scans on 09/28/21 no sign of metastatic disease.   - Echo 11/09/21; EF 45-50% Global HK. Spiro was increased to 25 mg daily  - Echo 07/11/22 EF 45-50%  - Coronary CT 9/23 with 3v CAD.  1. LAD: findings 0.84, 0.80 0.71 very distal vessel D1: 0.91, 0.79 D2: 0.81 2. LCX: findings 0.77 3.  RCA: findings 0.68, 0.58 0.50    PDA: 0.53  Has finished Herceptin /Perjeta .    -Echo 10/13/22: EF 40-45% - LHC 11/23 with moderate CAD. Plan for medical management.  CTA Heart  1. Coronary calcium score of 2307. This was 60 percentile for age and sex matched control. 2. Diffuse multivessel mixed plaque with moderate stenosis. Sending for FFR analysis. CT today appears unchanged from recent heart cath 10/26/22. 3. Mildly dilated ascending aorta 44 mm (prior 42 mm 07/2022).  CT 8/25 Stable Asc Ao aneurysm 4.2cm (stable) R kidney atrophic (suspect due to R RAS)  Here for HF f/u. Daughter Lynn Mcgee on the phone. She says she is doing fine. Denies CP.+ mild exertional SOB. Still smoking 1/2 ppd. No edema, orthopnea or PND> BP is high when she gets it checked.   Past Medical History:  Diagnosis Date   Cancer [facility])    right breast cancer   CHF (congestive heart failure) (HCC)    per patient   Heart murmur    pt states MD stated she had heart murmur years ago   History  of radiation therapy    Right chest wall 04/06/22-05/18/22- Dr. Lynwood Nasuti   Hypertension    Current Outpatient Medications  Medication Sig Dispense Refill   acetaminophen  (TYLENOL ) 500 MG tablet Take 1,000 mg by mouth every 6 (six) hours as needed for mild pain.     albuterol (VENTOLIN HFA) 108 (90 Base) MCG/ACT inhaler Inhale into the lungs.     aspirin  EC 81 MG tablet Take 1 tablet (81 mg total) by mouth daily. Swallow whole. 90 tablet 3   atorvastatin (LIPITOR) 40 MG tablet Take 40 mg by mouth daily.     carvedilol  (COREG ) 12.5 MG tablet Take 1 tablet (12.5 mg total) by mouth 2 (two) times daily. 180 tablet 3   empagliflozin  (JARDIANCE ) 10 MG TABS tablet Take 1 tablet (10 mg total) by mouth daily before breakfast. 90 tablet 3   hydrOXYzine  (ATARAX ) 25 MG tablet Take 25 mg by mouth every 8 (eight) hours as needed for anxiety.     potassium chloride  (KLOR-CON ) 20 MEQ packet Take 20 mEq by mouth daily. 30 packet 1   sacubitril-valsartan (ENTRESTO ) 97-103 MG Take 1 tablet by mouth 2 (two) times daily. 60 tablet 6   spironolactone  (ALDACTONE ) 25 MG tablet Take 1 tablet (25 mg total) by mouth daily. 90 tablet 3   No current facility-administered medications for this  encounter.   No Known Allergies  Social History   Socioeconomic History   Marital status: Single    Spouse name: Not on file   Number of children: Not on file   Years of education: Not on file   Highest education level: Not on file  Occupational History   Not on file  Tobacco Use   Smoking status: Every Day    Current packs/day: 0.50    Average packs/day: 0.5 packs/day for 35.0 years (17.5 ttl pk-yrs)    Types: Cigarettes   Smokeless tobacco: Never  Vaping Use   Vaping status: Never Used  Substance and Sexual Activity   Alcohol use: Not Currently    Comment: way back   Drug use: Yes    Types: Marijuana    Comment: occasionally every couple of months   Sexual activity: Not on file  Other Topics Concern    Not on file  Social History Narrative   Not on file   Social Drivers of Health   Financial Resource Strain: Not on file  Food Insecurity: Not on file  Transportation Needs: Not on file  Physical Activity: Not on file  Stress: Not on file  Social Connections: Not on file  Intimate Partner Violence: Not on file   Vitals:   09/09/24 1345  BP: (!) 168/80  Pulse: 71  SpO2: 94%  Weight: 58.5 kg (129 lb)    PHYSICAL EXAM: General:  Elderly thin No resp difficulty HEENT: normal Neck: supple. no JVD. Carotids 2+ bilat; no bruits. No lymphadenopathy or thryomegaly appreciated. Cor: PMI nondisplaced. Regular rate & rhythm. 3/6 SEM RUSB Lungs: clear but decreased throughout  Abdomen: soft, nontender, nondistended. No hepatosplenomegaly. No bruits or masses. Good bowel sounds. Extremities: no cyanosis, clubbing, rash, edema Neuro: alert & orientedx3, cranial nerves grossly intact. moves all 4 extremities w/o difficulty. Affect pleasant   Wt Readings from Last 3 Encounters:  09/09/24 58.5 kg (129 lb)  01/25/24 60.6 kg (133 lb 11.2 oz)  01/02/24 60.2 kg (132 lb 12.8 oz)     ASSESSMENT & PLAN:  1. Chronic systolic HF - Echo 10/21 ([place]) EF 40-45% - Myoview 10/21 EF 36% ? anterior wall defect -> treated medically - CT chest 10/22 + 3v coronary calcium - Echo 10/22 (pre-chemo) EF 45-50% No RWMA  - Echo 11/09/21 EF 50-55%  - Echo 5/23 EF 45-50% - Echo  07/11/22 EF 45-50%  - Echo 10/13/22: EF 40-45% - LHC 11/23 with moderate CAD - Echo  01/02/24 EF 55-60% Personally reviewed - Stable NYHA II susepect due mostly to COPD Volume ok  - Continue Entresto  to 97/103 bid - Continue spiro 25 mg daily - Continue carvedilol  at 12.5 mg  bid - Continue Jardiance    2. Breast Cancer  - Stage IIIB (cT4d, cN1, cM0, G3, ER-, PR-, HER2+)  - Had Echo 09/22/21 EF 45-50% -->Pre chemo - Started on  Docetaxel  + Carboplatin  + Trastuzumab  + Pertuzumab   (TCHP) q21d  on 09/23/21 - Echo 11/09/21 EF  50-55%  - Echo 5/23 EF 45-50% - Echo  07/11/22 EF 45-50%  - Finished Herceptin /perjeta  - Echo 10/13/22: EF 40-45% - Echo  01/02/24 EF 55-60%  3. CAD - cardiac CT with 3v cad - LHC 11/23 with moderate CAD. Calcific CAD with moderate stenosis in the proximal and distal RCA. Plan for medical management.  - No s/s angina - continue statin and ASA  4. 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

## 2024-09-09 NOTE — Patient Instructions (Signed)
 Medication Changes:  START Amlodipine  5 mg Daily   Testing/Procedures:  Your physician has requested that you have a renal artery duplex. During this test, an ultrasound is used to evaluate blood flow to the kidneys. Allow one hour for this exam. Do not eat after midnight the day before and avoid carbonated beverages. Take your medications as you usually do. YOU WILL BE CALLED TO SCHEDULE THIS   Special Instructions // Education:  Do the following things EVERYDAY: Weigh yourself in the morning before breakfast. Write it down and keep it in a log. Take your medicines as prescribed Eat low salt foods--Limit salt (sodium) to 2000 mg per day.  Stay as active as you can everyday Limit all fluids for the day to less than 2 liters   Follow-Up in: 4-6 months with Dr Cherrie (February-April 2026), **PLEASE CALL OUR OFFICE IN JANUARY TO SCHEDULE THIS APPOINTMENT   At [facility], you and your health needs are our priority. We have a designated team specialized in the treatment of Heart Failure. This Care Team includes your primary Heart Failure Specialized Cardiologist (physician), Advanced Practice Providers (APPs- Physician Assistants and Nurse Practitioners), and Pharmacist who all work together to provide you with the care you need, when you need it.   You may see any of the following providers on your designated Care Team at your next follow up:  Dr. Toribio Cherrie Dr. Ezra Shuck Dr. Ria Commander Dr. Odis Brownie Greig Mosses, NP Caffie Shed, [place] [facility] [place], [place] Beckey Coe, NP [place] Lee, NP Tinnie Redman, PharmD   Please be sure to bring in all your medications bottles to every appointment.   Need to Contact Us :  If you have any questions or concerns before your next appointment please send us  a message through [place] or call our office at [phone].    TO LEAVE A MESSAGE FOR THE NURSE SELECT OPTION 2, PLEASE LEAVE A  MESSAGE INCLUDING: YOUR NAME DATE OF BIRTH CALL BACK NUMBER REASON FOR CALL**this is important as we prioritize the call backs  YOU WILL RECEIVE A CALL BACK THE SAME DAY AS LONG AS YOU CALL BEFORE 4:00 PM

## 2024-09-27 ENCOUNTER — Ambulatory Visit (HOSPITAL_COMMUNITY)
Admission: RE | Admit: 2024-09-27 | Discharge: 2024-09-27 | Disposition: A | Discharge status: Home / Self Care | Source: Ambulatory Visit | Attending: Internal Medicine | Admitting: Internal Medicine

## 2024-09-27 DIAGNOSIS — I701 Atherosclerosis of renal artery: Secondary | ICD-10-CM | POA: Diagnosis not present

## 2024-10-08 ENCOUNTER — Encounter: Payer: Self-pay | Admitting: Hematology and Oncology

## 2024-10-08 ENCOUNTER — Encounter (HOSPITAL_COMMUNITY): Admitting: Internal Medicine

## 2024-10-10 ENCOUNTER — Encounter: Payer: Self-pay | Admitting: Hematology and Oncology

## 2024-10-10 ENCOUNTER — Other Ambulatory Visit (HOSPITAL_COMMUNITY): Payer: Self-pay

## 2024-10-31 DIAGNOSIS — E559 Vitamin D deficiency, unspecified: Secondary | ICD-10-CM | POA: Diagnosis not present

## 2024-10-31 DIAGNOSIS — I110 Hypertensive heart disease with heart failure: Secondary | ICD-10-CM | POA: Diagnosis not present

## 2024-10-31 DIAGNOSIS — E782 Mixed hyperlipidemia: Secondary | ICD-10-CM | POA: Diagnosis not present

## 2024-10-31 DIAGNOSIS — I5022 Chronic systolic (congestive) heart failure: Secondary | ICD-10-CM | POA: Diagnosis not present

## 2024-10-31 DIAGNOSIS — Z720 Tobacco use: Secondary | ICD-10-CM | POA: Diagnosis not present

## 2024-10-31 DIAGNOSIS — I2510 Atherosclerotic heart disease of native coronary artery without angina pectoris: Secondary | ICD-10-CM | POA: Diagnosis not present

## 2024-10-31 DIAGNOSIS — F419 Anxiety disorder, unspecified: Secondary | ICD-10-CM | POA: Diagnosis not present

## 2024-11-14 ENCOUNTER — Telehealth (HOSPITAL_COMMUNITY): Payer: Self-pay | Admitting: Vascular Surgery

## 2024-11-14 ENCOUNTER — Other Ambulatory Visit (HOSPITAL_COMMUNITY): Payer: Self-pay | Admitting: *Deleted

## 2024-11-14 DIAGNOSIS — I5022 Chronic systolic (congestive) heart failure: Secondary | ICD-10-CM

## 2024-11-14 DIAGNOSIS — C50411 Malignant neoplasm of upper-outer quadrant of right female breast: Secondary | ICD-10-CM

## 2024-11-14 NOTE — Telephone Encounter (Signed)
 Lvm to make f/u w/ echo

## 2024-11-14 NOTE — Progress Notes (Signed)
 Per Dr Cherrie pt will need repeat/yearly echo in Feb with next appt. Order placed

## 2025-01-02 ENCOUNTER — Telehealth: Payer: Self-pay | Admitting: Hematology and Oncology

## 2025-01-02 NOTE — Telephone Encounter (Signed)
 I spoke with patient's daughter and she is aware of MD appointment on 02/10/2025 and will let patient know.

## 2025-01-02 NOTE — Telephone Encounter (Signed)
 I attempted to reach patient and mailbox is full. I mailed a reminder of rescheduled MD appointment to address on file.

## 2025-01-27 ENCOUNTER — Other Ambulatory Visit (HOSPITAL_COMMUNITY)

## 2025-01-27 ENCOUNTER — Ambulatory Visit (HOSPITAL_COMMUNITY): Admitting: Internal Medicine

## 2025-01-27 ENCOUNTER — Ambulatory Visit: Payer: Medicare HMO | Admitting: Hematology and Oncology

## 2025-02-10 ENCOUNTER — Inpatient Hospital Stay: Admitting: Hematology and Oncology

## 2025-03-11 ENCOUNTER — Ambulatory Visit (HOSPITAL_COMMUNITY): Admitting: Internal Medicine

## 2025-03-11 ENCOUNTER — Other Ambulatory Visit (HOSPITAL_COMMUNITY)
